# Patient Record
Sex: Male | Born: 1970 | Race: Black or African American | Hispanic: No | Marital: Single | Smoking: Current every day smoker
Health system: Southern US, Community
[De-identification: ages and names within clinical notes are randomized; demographics above are authoritative.]

## PROBLEM LIST (undated history)

## (undated) DIAGNOSIS — J40 Bronchitis, not specified as acute or chronic: Secondary | ICD-10-CM

## (undated) DIAGNOSIS — M1990 Unspecified osteoarthritis, unspecified site: Secondary | ICD-10-CM

## (undated) DIAGNOSIS — N19 Unspecified kidney failure: Secondary | ICD-10-CM

## (undated) DIAGNOSIS — M109 Gout, unspecified: Secondary | ICD-10-CM

## (undated) DIAGNOSIS — N289 Disorder of kidney and ureter, unspecified: Secondary | ICD-10-CM

## (undated) DIAGNOSIS — J45909 Unspecified asthma, uncomplicated: Secondary | ICD-10-CM

## (undated) DIAGNOSIS — Z21 Asymptomatic human immunodeficiency virus [HIV] infection status: Secondary | ICD-10-CM

## (undated) DIAGNOSIS — K4090 Unilateral inguinal hernia, without obstruction or gangrene, not specified as recurrent: Secondary | ICD-10-CM

## (undated) DIAGNOSIS — S2239XA Fracture of one rib, unspecified side, initial encounter for closed fracture: Secondary | ICD-10-CM

## (undated) DIAGNOSIS — Z5900 Homelessness unspecified: Secondary | ICD-10-CM

---

## 1997-11-12 ENCOUNTER — Emergency Department (HOSPITAL_COMMUNITY): Admission: EM | Admit: 1997-11-12 | Discharge: 1997-11-12 | Payer: Self-pay | Admitting: Emergency Medicine

## 1997-11-27 ENCOUNTER — Emergency Department (HOSPITAL_COMMUNITY): Admission: EM | Admit: 1997-11-27 | Discharge: 1997-11-27 | Payer: Self-pay | Admitting: Emergency Medicine

## 1998-02-11 ENCOUNTER — Emergency Department (HOSPITAL_COMMUNITY): Admission: EM | Admit: 1998-02-11 | Discharge: 1998-02-11 | Payer: Self-pay

## 1998-08-24 ENCOUNTER — Emergency Department (HOSPITAL_COMMUNITY): Admission: EM | Admit: 1998-08-24 | Discharge: 1998-08-25 | Payer: Self-pay | Admitting: Emergency Medicine

## 1999-02-08 ENCOUNTER — Encounter: Payer: Self-pay | Admitting: Emergency Medicine

## 1999-02-08 ENCOUNTER — Emergency Department (HOSPITAL_COMMUNITY): Admission: EM | Admit: 1999-02-08 | Discharge: 1999-02-08 | Payer: Self-pay | Admitting: Emergency Medicine

## 2000-07-09 ENCOUNTER — Emergency Department (HOSPITAL_COMMUNITY): Admission: EM | Admit: 2000-07-09 | Discharge: 2000-07-09 | Payer: Self-pay | Admitting: *Deleted

## 2002-11-13 ENCOUNTER — Emergency Department (HOSPITAL_COMMUNITY): Admission: EM | Admit: 2002-11-13 | Discharge: 2002-11-13 | Payer: Self-pay | Admitting: Emergency Medicine

## 2002-11-13 ENCOUNTER — Encounter: Payer: Self-pay | Admitting: Emergency Medicine

## 2004-01-06 ENCOUNTER — Emergency Department (HOSPITAL_COMMUNITY): Admission: EM | Admit: 2004-01-06 | Discharge: 2004-01-06 | Payer: Self-pay | Admitting: *Deleted

## 2004-02-19 ENCOUNTER — Emergency Department (HOSPITAL_COMMUNITY): Admission: EM | Admit: 2004-02-19 | Discharge: 2004-02-19 | Payer: Self-pay | Admitting: Emergency Medicine
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
## 2004-03-02 ENCOUNTER — Emergency Department (HOSPITAL_COMMUNITY): Admission: EM | Admit: 2004-03-02 | Discharge: 2004-03-02 | Payer: Self-pay | Admitting: Emergency Medicine

## 2004-03-10 ENCOUNTER — Emergency Department (HOSPITAL_COMMUNITY): Admission: EM | Admit: 2004-03-10 | Discharge: 2004-03-10 | Payer: Self-pay | Admitting: Emergency Medicine

## 2005-01-17 ENCOUNTER — Emergency Department (HOSPITAL_COMMUNITY): Admission: EM | Admit: 2005-01-17 | Discharge: 2005-01-17 | Payer: Self-pay | Admitting: Emergency Medicine

## 2005-01-20 ENCOUNTER — Emergency Department (HOSPITAL_COMMUNITY): Admission: EM | Admit: 2005-01-20 | Discharge: 2005-01-20 | Payer: Self-pay | Admitting: Emergency Medicine

## 2006-02-01 ENCOUNTER — Emergency Department (HOSPITAL_COMMUNITY): Admission: EM | Admit: 2006-02-01 | Discharge: 2006-02-01 | Payer: Self-pay | Admitting: Emergency Medicine

## 2006-02-01 IMAGING — CR DG HIP COMPLETE 2+V*R*
3 series · 3 of 3 positions shown · non-contrast
Comparison: none

CLINICAL DATA: Fall two weeks ago with pain.
 RIGHT HIP ? 3 VIEWS:

[t pelvis a.p.]
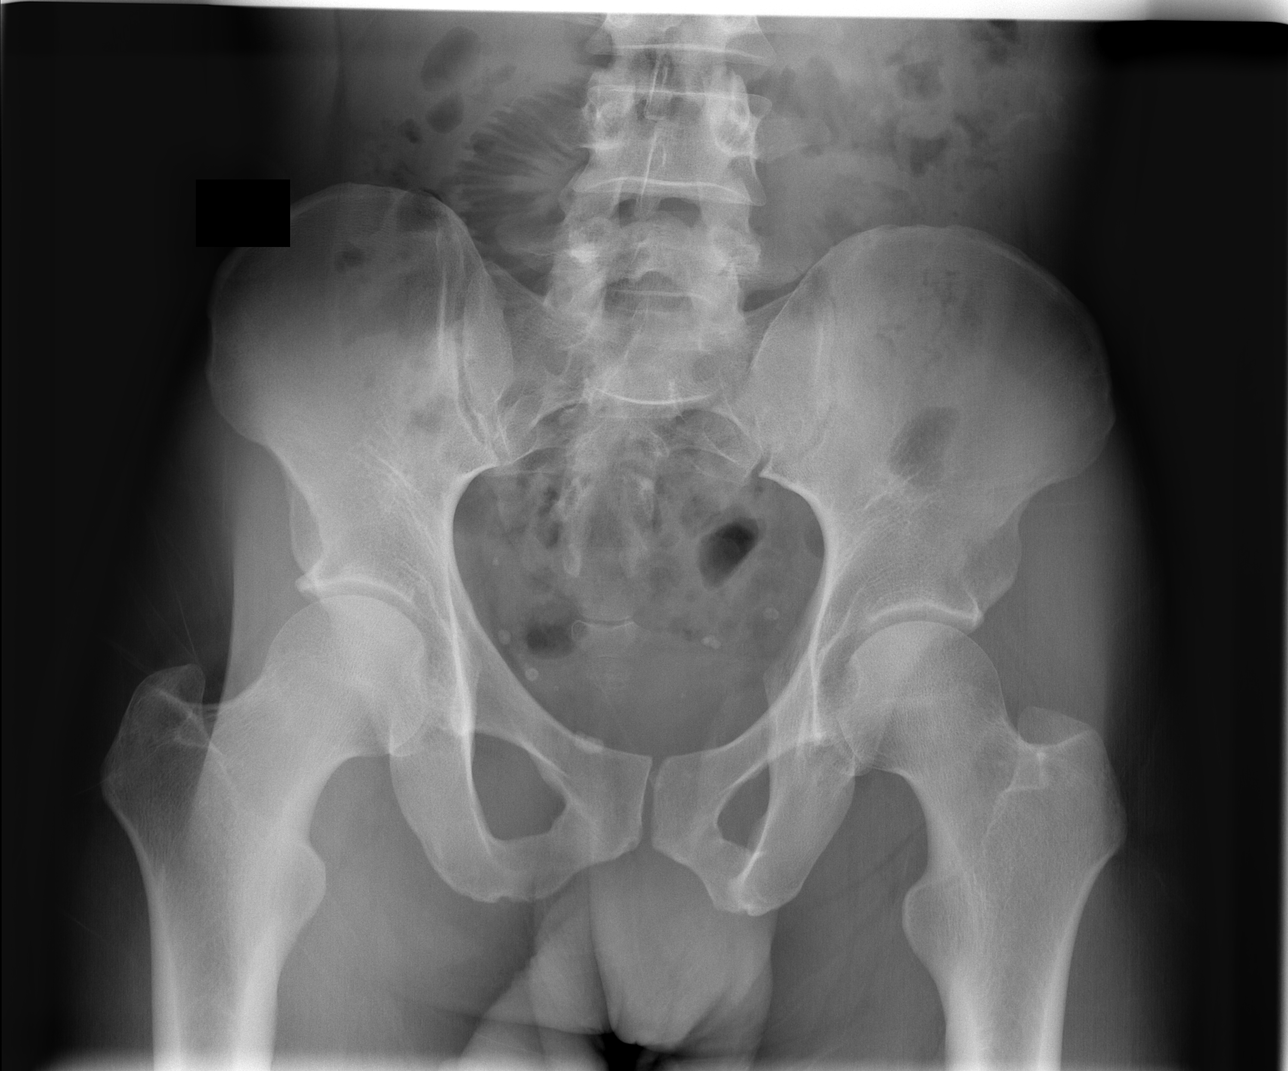

[t hip ap right]
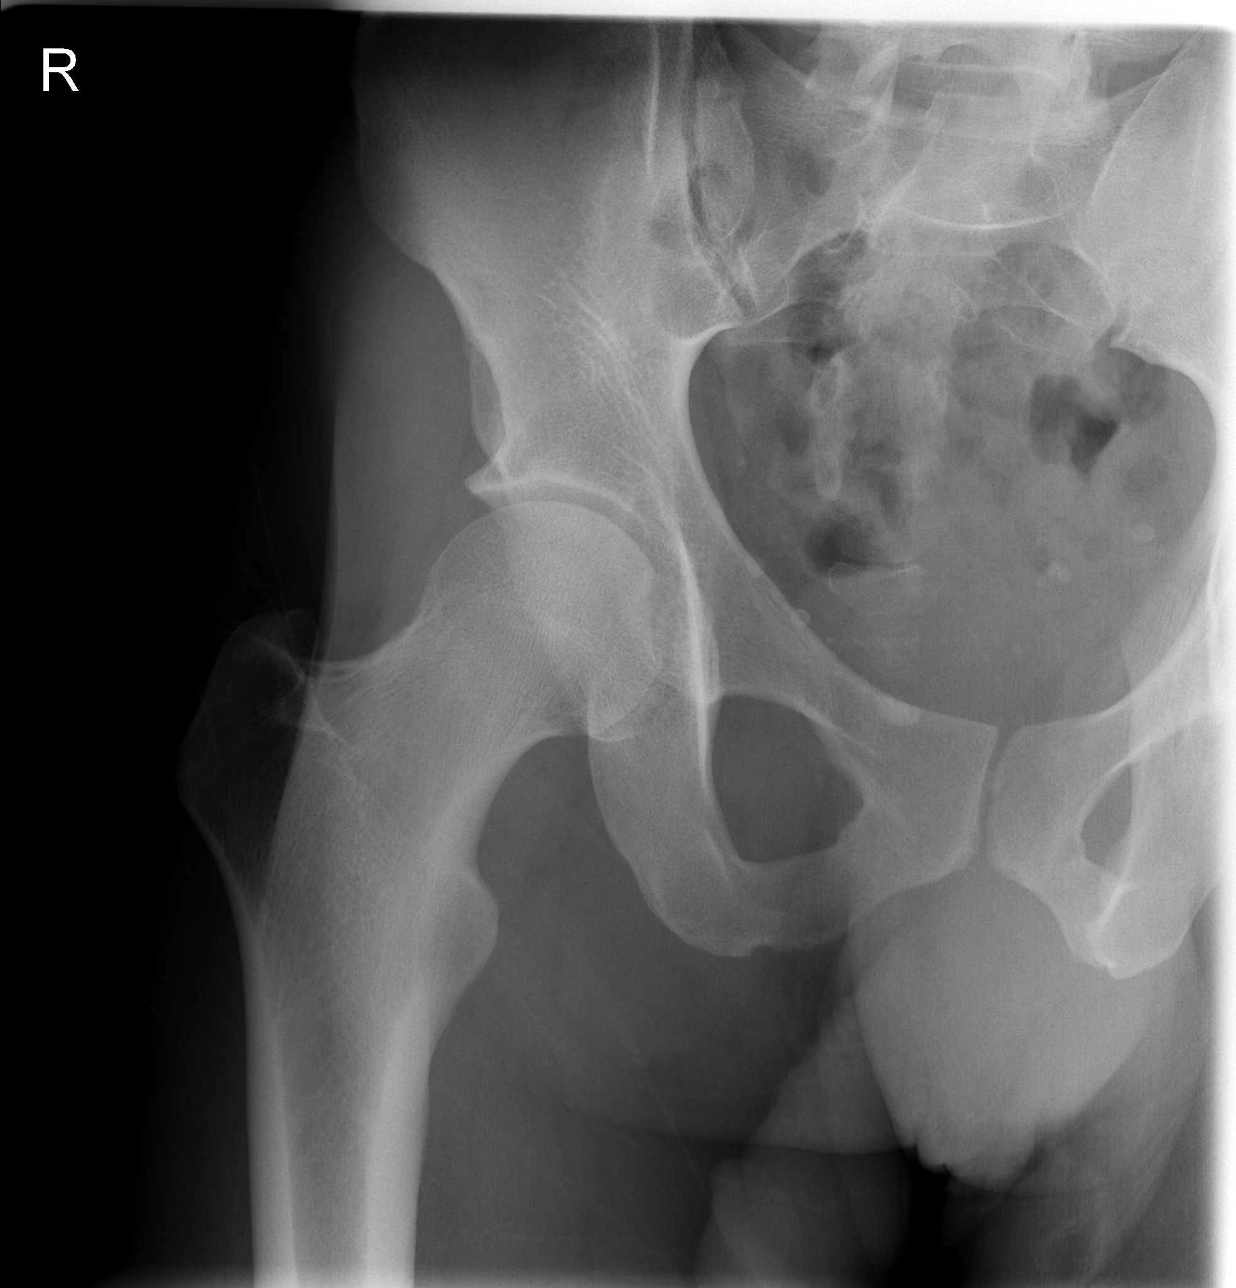

[t hip frog leg right]
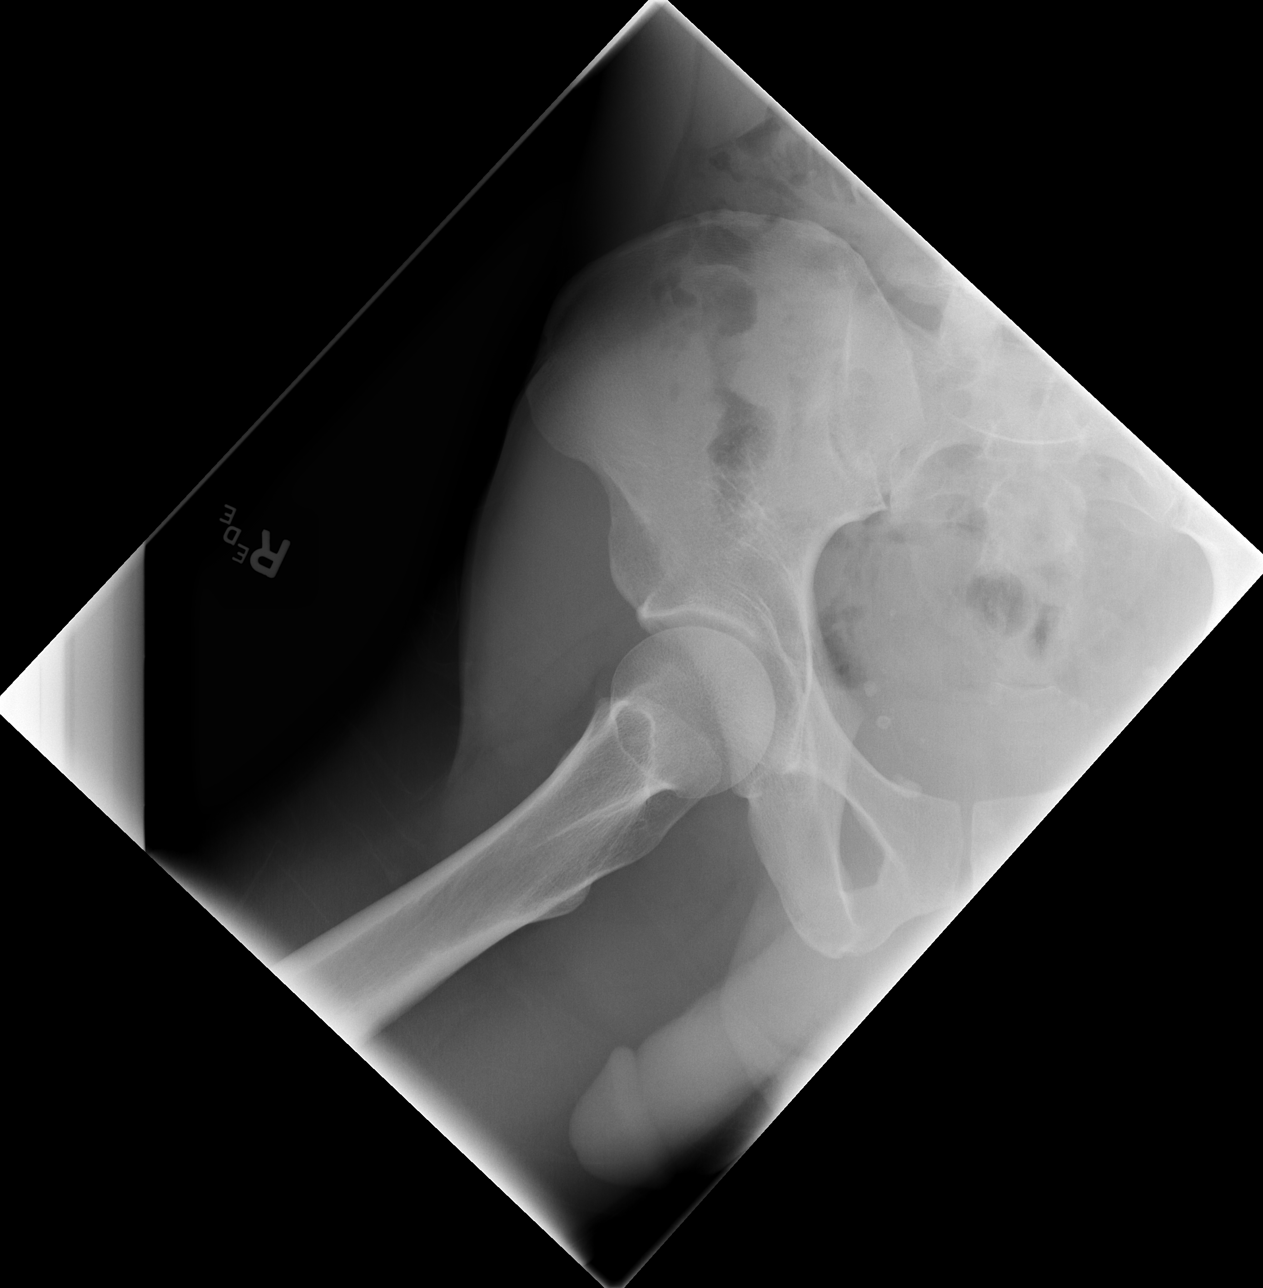

[3 of 3 positions shown; findings below may reference images not displayed]

FINDINGS: No fracture or dislocation.  Incidental imaging of the lower abdomen shows a minimally distended loop of small bowel with apparent wall edema.
IMPRESSION: 1.  No fracture or dislocation.  
 2.  Question small bowel wall edema, as above.  Please correlate clinically.

## 2006-03-04 ENCOUNTER — Emergency Department (HOSPITAL_COMMUNITY): Admission: EM | Admit: 2006-03-04 | Discharge: 2006-03-04 | Payer: Self-pay | Admitting: Emergency Medicine

## 2006-05-15 ENCOUNTER — Emergency Department (HOSPITAL_COMMUNITY): Admission: EM | Admit: 2006-05-15 | Discharge: 2006-05-15 | Payer: Self-pay | Admitting: Family Medicine
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
## 2006-09-13 ENCOUNTER — Ambulatory Visit: Payer: Self-pay | Admitting: Nurse Practitioner

## 2007-02-22 ENCOUNTER — Emergency Department (HOSPITAL_COMMUNITY): Admission: EM | Admit: 2007-02-22 | Discharge: 2007-02-22 | Payer: Self-pay | Admitting: Family Medicine

## 2008-10-12 ENCOUNTER — Emergency Department (HOSPITAL_COMMUNITY): Admission: EM | Admit: 2008-10-12 | Discharge: 2008-10-12 | Payer: Self-pay | Admitting: Emergency Medicine

## 2013-02-05 ENCOUNTER — Encounter (HOSPITAL_COMMUNITY): Payer: Self-pay | Admitting: Emergency Medicine

## 2013-02-05 ENCOUNTER — Emergency Department (HOSPITAL_COMMUNITY)
Admission: EM | Admit: 2013-02-05 | Discharge: 2013-02-05 | Disposition: A | Discharge status: Home / Self Care | Payer: Self-pay | Attending: Emergency Medicine | Admitting: Emergency Medicine

## 2013-02-05 ENCOUNTER — Emergency Department (HOSPITAL_COMMUNITY): Payer: Self-pay

## 2013-02-05 DIAGNOSIS — J9801 Acute bronchospasm: Secondary | ICD-10-CM | POA: Insufficient documentation

## 2013-02-05 DIAGNOSIS — M109 Gout, unspecified: Secondary | ICD-10-CM | POA: Insufficient documentation

## 2013-02-05 DIAGNOSIS — R0789 Other chest pain: Secondary | ICD-10-CM

## 2013-02-05 DIAGNOSIS — J45909 Unspecified asthma, uncomplicated: Secondary | ICD-10-CM | POA: Insufficient documentation

## 2013-02-05 DIAGNOSIS — Z79899 Other long term (current) drug therapy: Secondary | ICD-10-CM | POA: Insufficient documentation

## 2013-02-05 DIAGNOSIS — F17200 Nicotine dependence, unspecified, uncomplicated: Secondary | ICD-10-CM | POA: Insufficient documentation

## 2013-02-05 DIAGNOSIS — R071 Chest pain on breathing: Secondary | ICD-10-CM | POA: Insufficient documentation

## 2013-02-05 DIAGNOSIS — R05 Cough: Secondary | ICD-10-CM

## 2013-02-05 HISTORY — DX: Unspecified asthma, uncomplicated: J45.909

## 2013-02-05 HISTORY — DX: Gout, unspecified: M10.9

## 2013-02-05 IMAGING — CR DG CHEST 2V
2 series · 2 of 2 positions shown · non-contrast
Comparison: Chest radiograph [DATE]

CLINICAL DATA: Mid chest pain.

CHEST - 2 VIEW

[w chest pa]
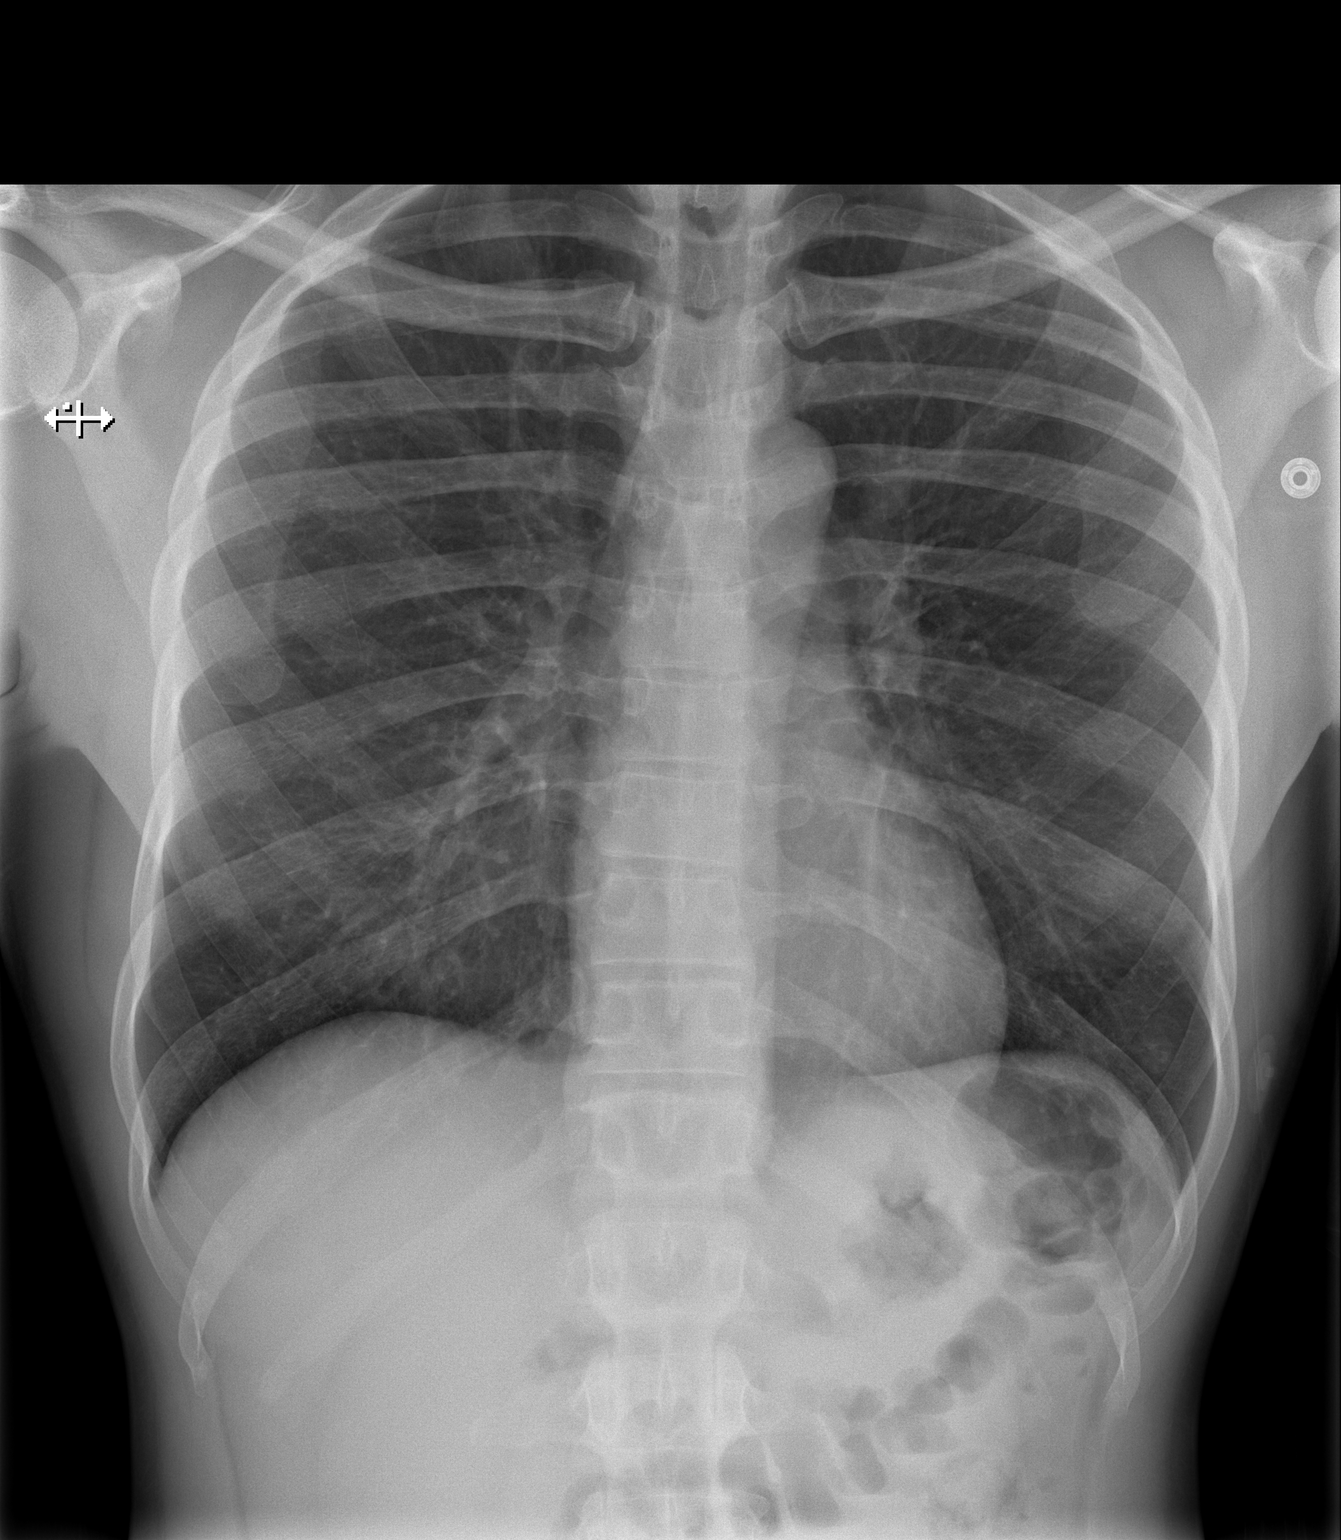

[w chest lat]
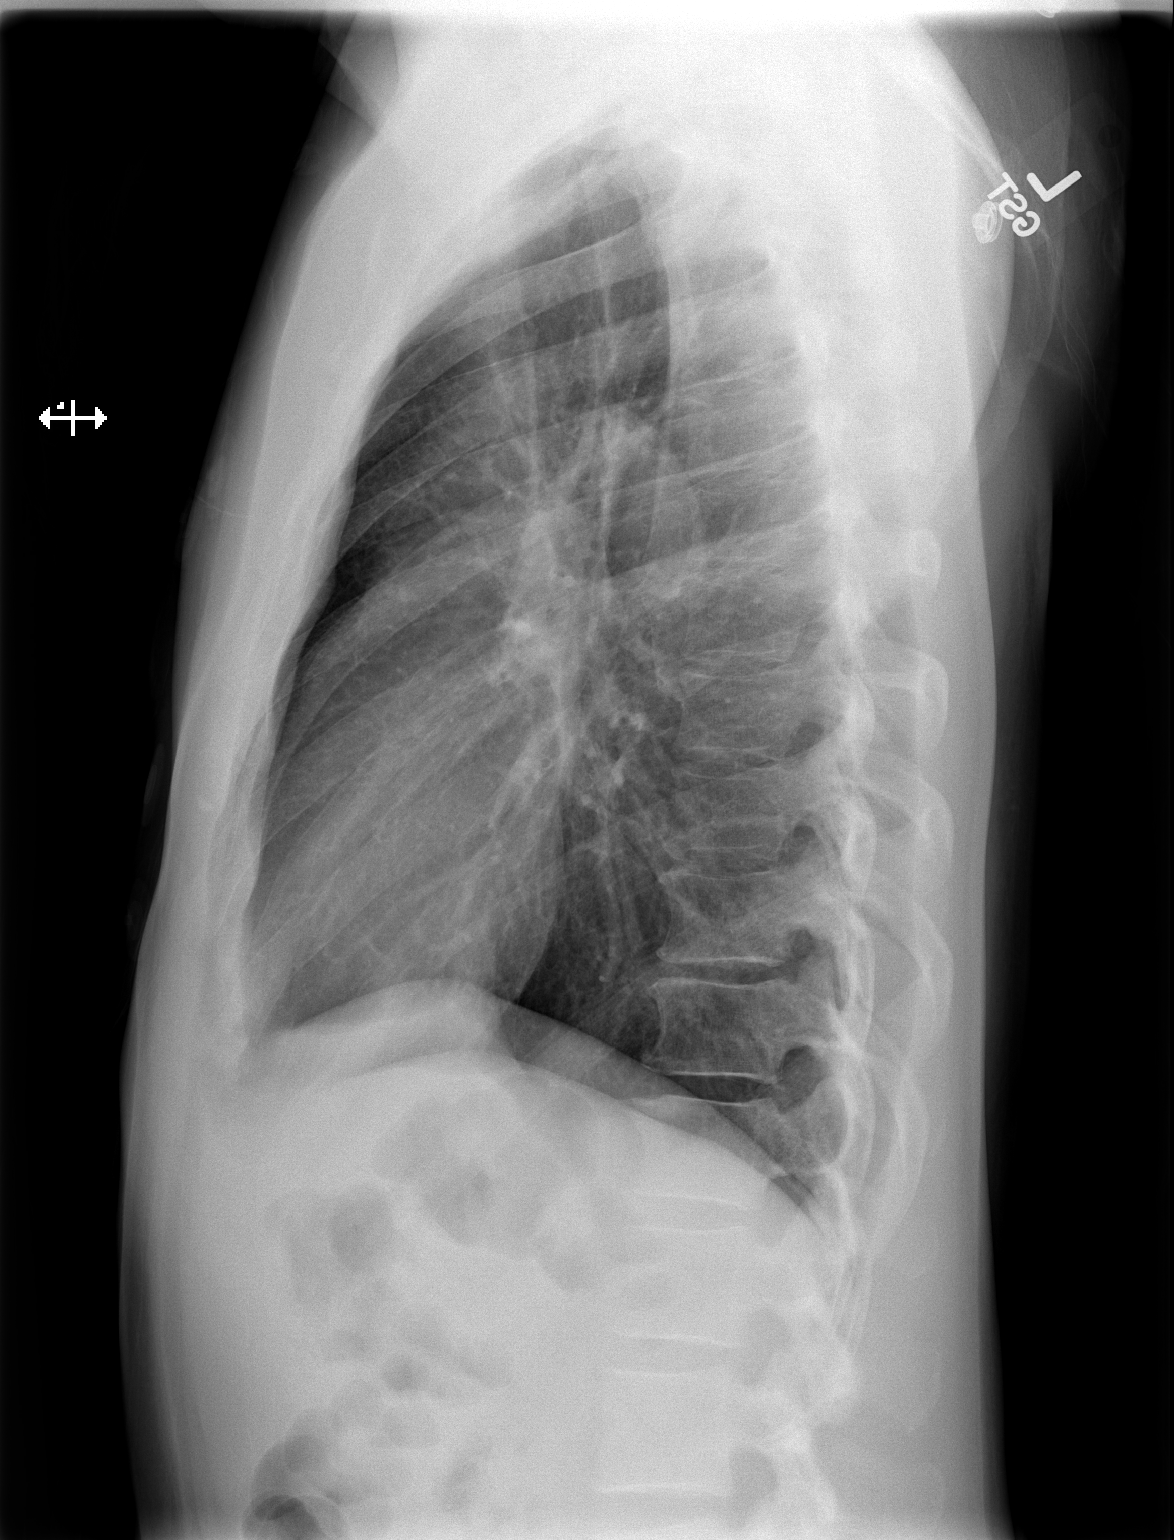

[2 of 2 positions shown; findings below may reference images not displayed]

FINDINGS: The cardiomediastinal silhouette is unremarkable.  The
lungs are clear without pleural effusions or focal consolidations.
The pulmonary vasculature is unremarkable.   Trachea projects
midline and there is no pneumothorax.

The included soft tissue planes and osseous structures are
unremarkable.  Pectus excavatum again noted.
IMPRESSION: No acute cardiopulmonary process; stable appearance of chest from
[DATE].

## 2013-02-05 MED ORDER — ALBUTEROL SULFATE HFA 108 (90 BASE) MCG/ACT IN AERS: 1.0000 | INHALATION_SPRAY | RESPIRATORY_TRACT | Status: DC | PRN

## 2013-02-05 MED ORDER — HYDROCOD POLST-CHLORPHEN POLST 10-8 MG/5ML PO LQCR
5.0000 mL | Freq: Once | ORAL | Status: AC
  Administered 2013-02-05: 5 mL via ORAL
  Filled 2013-02-05: qty 5

## 2013-02-05 MED ORDER — ALBUTEROL SULFATE HFA 108 (90 BASE) MCG/ACT IN AERS
1.0000 | INHALATION_SPRAY | RESPIRATORY_TRACT | Status: DC | PRN
  Administered 2013-02-05: 2 via RESPIRATORY_TRACT
  Filled 2013-02-05: qty 6.7

## 2013-02-05 MED ORDER — HYDROCOD POLST-CHLORPHEN POLST 10-8 MG/5ML PO LQCR: 5.0000 mL | Freq: Once | ORAL | Status: DC
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
## 2013-02-05 NOTE — ED Notes (Signed)
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  Pain increases with palpation to left breast area medially.

## 2013-07-28 ENCOUNTER — Emergency Department (HOSPITAL_COMMUNITY): Payer: Self-pay

## 2013-07-28 ENCOUNTER — Encounter (HOSPITAL_COMMUNITY): Payer: Self-pay | Admitting: Emergency Medicine

## 2013-07-28 ENCOUNTER — Emergency Department (HOSPITAL_COMMUNITY)
Admission: EM | Admit: 2013-07-28 | Discharge: 2013-07-29 | Disposition: A | Discharge status: Home / Self Care | Payer: Self-pay | Attending: Emergency Medicine | Admitting: Emergency Medicine

## 2013-07-28 DIAGNOSIS — Z79899 Other long term (current) drug therapy: Secondary | ICD-10-CM | POA: Insufficient documentation

## 2013-07-28 DIAGNOSIS — F10929 Alcohol use, unspecified with intoxication, unspecified: Secondary | ICD-10-CM

## 2013-07-28 DIAGNOSIS — F1010 Alcohol abuse, uncomplicated: Secondary | ICD-10-CM | POA: Insufficient documentation

## 2013-07-28 DIAGNOSIS — J45909 Unspecified asthma, uncomplicated: Secondary | ICD-10-CM | POA: Insufficient documentation

## 2013-07-28 DIAGNOSIS — R51 Headache: Secondary | ICD-10-CM | POA: Insufficient documentation

## 2013-07-28 DIAGNOSIS — G8929 Other chronic pain: Secondary | ICD-10-CM | POA: Insufficient documentation

## 2013-07-28 DIAGNOSIS — M545 Low back pain, unspecified: Secondary | ICD-10-CM | POA: Insufficient documentation

## 2013-07-28 DIAGNOSIS — Z8639 Personal history of other endocrine, nutritional and metabolic disease: Secondary | ICD-10-CM | POA: Insufficient documentation

## 2013-07-28 DIAGNOSIS — M546 Pain in thoracic spine: Secondary | ICD-10-CM | POA: Insufficient documentation

## 2013-07-28 DIAGNOSIS — F17200 Nicotine dependence, unspecified, uncomplicated: Secondary | ICD-10-CM | POA: Insufficient documentation

## 2013-07-28 DIAGNOSIS — M542 Cervicalgia: Secondary | ICD-10-CM | POA: Insufficient documentation

## 2013-07-28 DIAGNOSIS — R42 Dizziness and giddiness: Secondary | ICD-10-CM | POA: Insufficient documentation

## 2013-07-28 DIAGNOSIS — R55 Syncope and collapse: Secondary | ICD-10-CM | POA: Insufficient documentation

## 2013-07-28 DIAGNOSIS — Z8739 Personal history of other diseases of the musculoskeletal system and connective tissue: Secondary | ICD-10-CM | POA: Insufficient documentation

## 2013-07-28 DIAGNOSIS — Z862 Personal history of diseases of the blood and blood-forming organs and certain disorders involving the immune mechanism: Secondary | ICD-10-CM | POA: Insufficient documentation

## 2013-07-28 HISTORY — DX: Unspecified osteoarthritis, unspecified site: M19.90

## 2013-07-28 LAB — CBC WITH DIFFERENTIAL/PLATELET
Basophils Absolute: 0 10*3/uL (ref 0.0–0.1)
Basophils Relative: 0 % (ref 0–1)
Eosinophils Absolute: 0.2 10*3/uL (ref 0.0–0.7)
Eosinophils Relative: 2 % (ref 0–5)
HCT: 34.9 % — ABNORMAL LOW (ref 39.0–52.0)
Hemoglobin: 12.5 g/dL — ABNORMAL LOW (ref 13.0–17.0)
Lymphocytes Relative: 43 % (ref 12–46)
Lymphs Abs: 3.6 10*3/uL (ref 0.7–4.0)
MCH: 34.2 pg — ABNORMAL HIGH (ref 26.0–34.0)
MCHC: 35.8 g/dL (ref 30.0–36.0)
MCV: 95.4 fL (ref 78.0–100.0)
Monocytes Absolute: 0.5 10*3/uL (ref 0.1–1.0)
Monocytes Relative: 6 % (ref 3–12)
Neutro Abs: 4.1 10*3/uL (ref 1.7–7.7)
Neutrophils Relative %: 49 % (ref 43–77)
Platelets: 299 10*3/uL (ref 150–400)
RBC: 3.66 MIL/uL — ABNORMAL LOW (ref 4.22–5.81)
RDW: 13.2 % (ref 11.5–15.5)
WBC: 8.4 10*3/uL (ref 4.0–10.5)

## 2013-07-28 LAB — I-STAT CHEM 8, ED
BUN: 9 mg/dL (ref 6–23)
Calcium, Ion: 1.14 mmol/L (ref 1.12–1.23)
Chloride: 107 mEq/L (ref 96–112)
Creatinine, Ser: 1.2 mg/dL (ref 0.50–1.35)
Glucose, Bld: 90 mg/dL (ref 70–99)
HCT: 39 % (ref 39.0–52.0)
Hemoglobin: 13.3 g/dL (ref 13.0–17.0)
Potassium: 3.6 mEq/L — ABNORMAL LOW (ref 3.7–5.3)
Sodium: 141 mEq/L (ref 137–147)
TCO2: 25 mmol/L (ref 0–100)

## 2013-07-28 LAB — CBG MONITORING, ED: Glucose-Capillary: 98 mg/dL (ref 70–99)
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
## 2013-07-28 NOTE — ED Notes (Signed)
confirmed needed labs with main lab

## 2013-07-28 NOTE — ED Notes (Signed)
Pt logrolled by RN x3, full spinal precautions maintained, TTP to complete spine, removed from LSB, c-collar remains, remains supine, HOB 15 degrees, alert, NAD, calm, cooperative, intoxicated, smells of ETOH, follows commands, MAEx4, A&Ox4, Dr. Ranae PalmsYelverton [facility] room[facility]
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
## 2013-07-29 LAB — COMPREHENSIVE METABOLIC PANEL
ALT: 17 U/L (ref 0–53)
AST: 21 U/L (ref 0–37)
Albumin: 3.7 g/dL (ref 3.5–5.2)
Alkaline Phosphatase: 60 U/L (ref 39–117)
BUN: 10 mg/dL (ref 6–23)
CO2: 23 mEq/L (ref 19–32)
Calcium: 8.8 mg/dL (ref 8.4–10.5)
Chloride: 102 mEq/L (ref 96–112)
Creatinine, Ser: 0.94 mg/dL (ref 0.50–1.35)
GFR calc Af Amer: >90 mL/min (ref >90)
GFR calc non Af Amer: >90 mL/min (ref >90)
Glucose, Bld: 88 mg/dL (ref 70–99)
Potassium: 3.8 mEq/L (ref 3.7–5.3)
Sodium: 142 mEq/L (ref 137–147)
Total Bilirubin: 0.2 mg/dL — ABNORMAL LOW (ref 0.3–1.2)
Total Protein: 7 g/dL (ref 6.0–8.3)

## 2013-07-29 LAB — ETHANOL: Alcohol, Ethyl (B): 162 mg/dL — ABNORMAL HIGH (ref 0–11)

## 2013-07-29 NOTE — ED Notes (Signed)
Imaging results reviewed 

## 2013-07-29 NOTE — Discharge Instructions (Signed)
Alcohol Intoxication Alcohol intoxication occurs when the amount of alcohol that a person has consumed impairs his or her ability to mentally and physically function. Alcohol directly impairs the normal chemical activity of the brain. Drinking large amounts of alcohol can lead to changes in mental function and behavior, and it can cause many physical effects that can be harmful.  Alcohol intoxication can range in severity from mild to very severe. Various factors can affect the level of intoxication that occurs, such as the person's age, gender, weight, frequency of alcohol consumption, and the presence of other medical conditions (such as diabetes, seizures, or heart conditions). Dangerous levels of alcohol intoxication may occur when people drink large amounts of alcohol in a short period (binge drinking). Alcohol can also be especially dangerous when combined with certain prescription medicines or "recreational" drugs. SIGNS AND SYMPTOMS Some common signs and symptoms of mild alcohol intoxication include:  Loss of coordination.  Changes in mood and behavior.  Impaired judgment.  Slurred speech. As alcohol intoxication progresses to more severe levels, other signs and symptoms will appear. These may include:  Vomiting.  Confusion and impaired memory.  Slowed breathing.  Seizures.  Loss of consciousness. DIAGNOSIS  Your health care provider will take a medical history and perform a physical exam. You will be asked about the amount and type of alcohol you have consumed. Blood tests will be done to measure the concentration of alcohol in your blood. In many places, your blood alcohol level must be lower than 80 mg/dL (0.86%0.08%) to legally drive. However, many dangerous effects of alcohol can occur at much lower levels.  TREATMENT  People with alcohol intoxication often do not require treatment. Most of the effects of alcohol intoxication are temporary, and they go away as the alcohol naturally  leaves the body. Your health care provider will monitor your condition until you are stable enough to go home. Fluids are sometimes given through an IV access tube to help prevent dehydration.  HOME CARE INSTRUCTIONS  Do not drive after drinking alcohol.  Stay hydrated. Drink enough water and fluids to keep your urine clear or pale yellow. Avoid caffeine.   Only take over-the-counter or prescription medicines as directed by your health care provider.  SEEK MEDICAL CARE IF:   You have persistent vomiting.   You do not feel better after a few days.  You have frequent alcohol intoxication. Your health care provider can help determine if you should see a substance use treatment counselor. SEEK IMMEDIATE MEDICAL CARE IF:   You become shaky or tremble when you try to stop drinking.   You shake uncontrollably (seizure).   You throw up (vomit) blood. This may be bright red or may look like black coffee grounds.   You have blood in your stool. This may be bright red or may appear as a black, tarry, bad smelling stool.   You become lightheaded or faint.  MAKE SURE YOU:   Understand these instructions.  Will watch your condition.  Will get help right away if you are not doing well or get worse. Document Released: 01/19/2005 Document Revised: 12/12/2012 Document Reviewed: 09/14/2012 [facility] Patient Information 2014 [place], [place].  Syncope Syncope is a fainting spell. This means the person loses consciousness and drops to the ground. The person is generally unconscious for less than 5 minutes. The person may have some muscle twitches for up to 15 seconds before waking up and returning to normal. Syncope occurs more often in elderly people, but it  can happen to anyone. While most causes of syncope are not dangerous, syncope can be a sign of a serious medical problem. It is important to seek medical care.  CAUSES  Syncope is caused by a sudden decrease in blood flow to the brain.  The specific cause is often not determined. Factors that can trigger syncope include:  Taking medicines that lower blood pressure.  Sudden changes in posture, such as standing up suddenly.  Taking more medicine than prescribed.  Standing in one place for too long.  Seizure disorders.  Dehydration and excessive exposure to heat.  Low blood sugar (hypoglycemia).  Straining to have a bowel movement.  Heart disease, irregular heartbeat, or other circulatory problems.  Fear, emotional distress, seeing blood, or severe pain. SYMPTOMS  Right before fainting, you may:  Feel dizzy or lightheaded.  Feel nauseous.  See all white or all black in your field of vision.  Have cold, clammy skin. DIAGNOSIS  Your caregiver will ask about your symptoms, perform a physical exam, and perform electrocardiography (ECG) to record the electrical activity of your heart. Your caregiver may also perform other heart or blood tests to determine the cause of your syncope. TREATMENT  In most cases, no treatment is needed. Depending on the cause of your syncope, your caregiver may recommend changing or stopping some of your medicines. HOME CARE INSTRUCTIONS  Have someone stay with you until you feel stable.  Do not drive, operate machinery, or play sports until your caregiver says it is okay.  Keep all follow-up appointments as directed by your caregiver.  Lie down right away if you start feeling like you might faint. Breathe deeply and steadily. Wait until all the symptoms have passed.  Drink enough fluids to keep your urine clear or pale yellow.  If you are taking blood pressure or heart medicine, get up slowly, taking several minutes to sit and then stand. This can reduce dizziness. SEEK IMMEDIATE MEDICAL CARE IF:   You have a severe headache.  You have unusual pain in the chest, abdomen, or back.  You are bleeding from the mouth or rectum, or you have black or tarry stool.  You have an  irregular or very fast heartbeat.  You have pain with breathing.  You have repeated fainting or seizure-like jerking during an episode.  You faint when sitting or lying down.  You have confusion.  You have difficulty walking.  You have severe weakness.  You have vision problems. If you fainted, call your local emergency services (911 in [place] Do not drive yourself to the hospital.  MAKE SURE YOU:  Understand these instructions.  Will watch your condition.  Will get help right away if you are not doing well or get worse. Document Released: 04/11/2005 Document Revised: 10/11/2011 Document Reviewed: 06/10/2011 [facility] Patient Information 2014 [place], [place].

## 2013-07-29 NOTE — ED Notes (Signed)
c-collar removed by Dr. Ranae PalmsYelverton
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
## 2013-07-29 NOTE — ED Notes (Signed)
Moved to C24, remains on monitor, VSS, [facility].

## 2013-10-17 IMAGING — CR DG KNEE COMPLETE 4+V*R*
4 series · 4 of 4 positions shown · non-contrast
Comparison: None.

CLINICAL DATA: Knee pain.

EXAM:
RIGHT KNEE - COMPLETE 4+ VIEW

[t knee ap right]
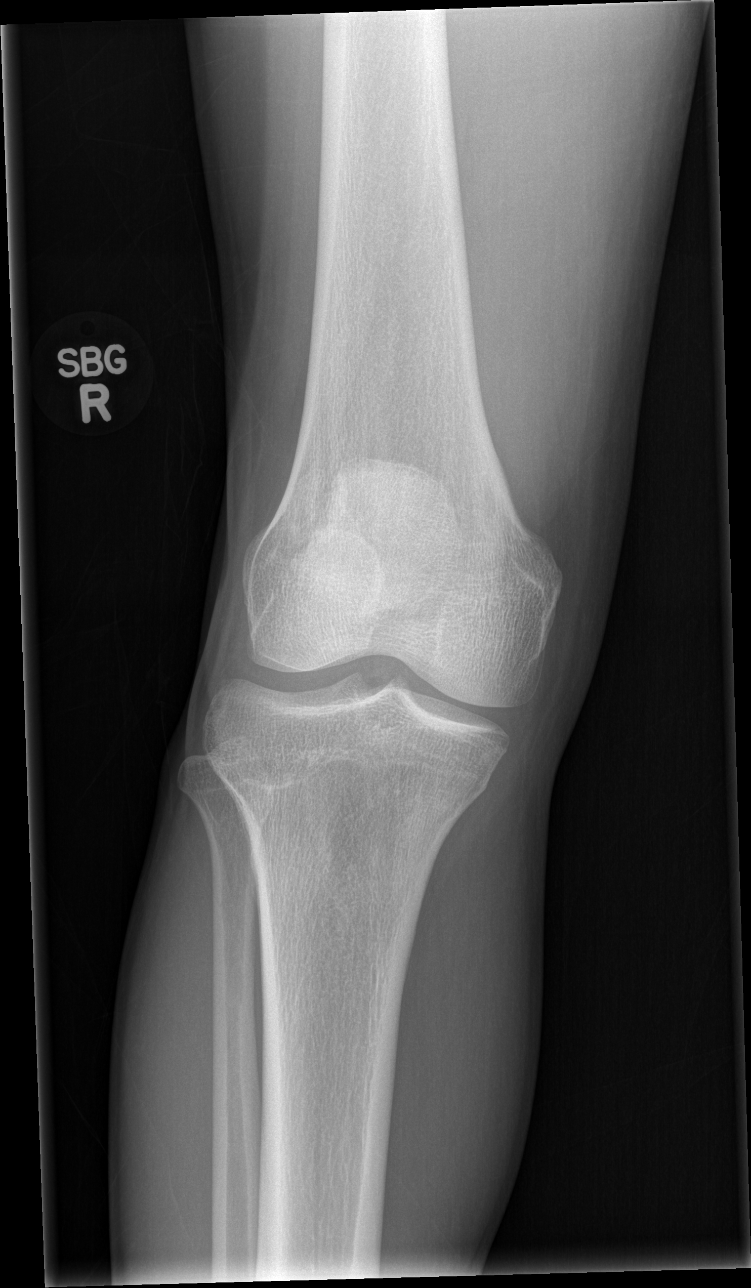

[t knee oblique right (1 of 2)]
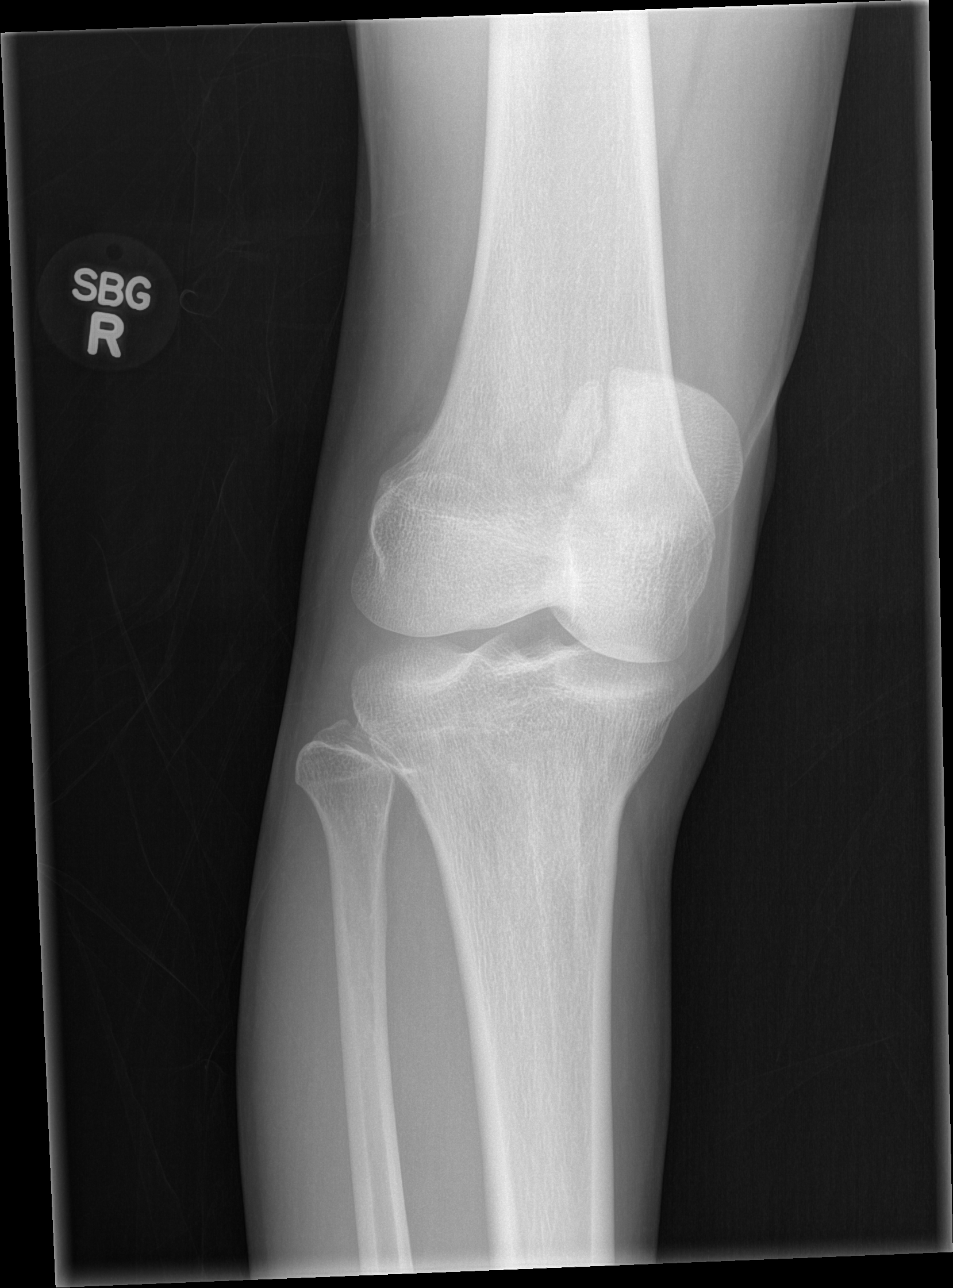

[t knee oblique right (2 of 2)]
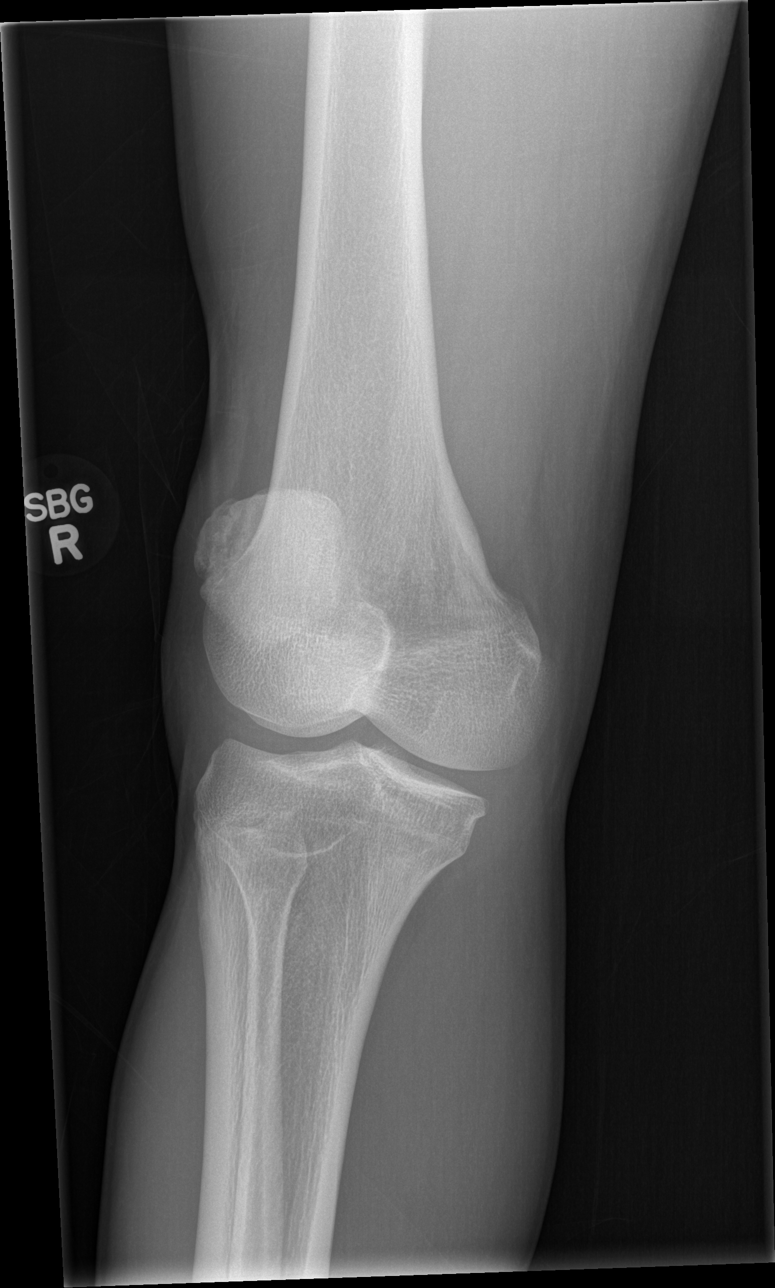

[t knee lat right]
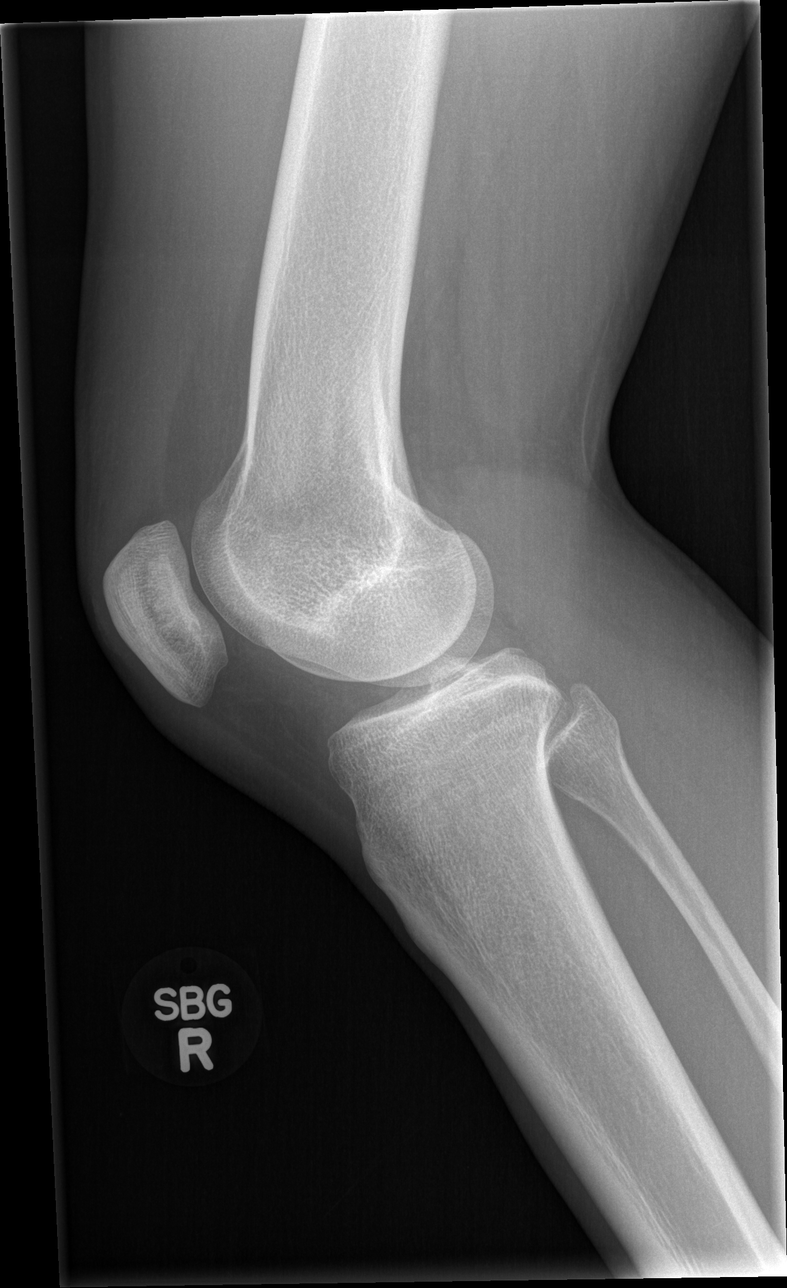

[4 of 4 positions shown; findings below may reference images not displayed]

FINDINGS: Soft tissue structures are unremarkable. Bipartite patella noted. No
evidence of fracture dislocation.
IMPRESSION: No acute abnormality.  Bipartite patella.

## 2013-12-21 ENCOUNTER — Emergency Department (HOSPITAL_COMMUNITY): Payer: Self-pay

## 2013-12-21 ENCOUNTER — Encounter (HOSPITAL_COMMUNITY): Payer: Self-pay | Admitting: Emergency Medicine

## 2013-12-21 ENCOUNTER — Emergency Department (HOSPITAL_COMMUNITY)
Admission: EM | Admit: 2013-12-21 | Discharge: 2013-12-21 | Disposition: A | Discharge status: Home / Self Care | Payer: Self-pay | Attending: Emergency Medicine | Admitting: Emergency Medicine

## 2013-12-21 DIAGNOSIS — Y92410 Unspecified street and highway as the place of occurrence of the external cause: Secondary | ICD-10-CM | POA: Insufficient documentation

## 2013-12-21 DIAGNOSIS — S4980XA Other specified injuries of shoulder and upper arm, unspecified arm, initial encounter: Secondary | ICD-10-CM | POA: Insufficient documentation

## 2013-12-21 DIAGNOSIS — Z862 Personal history of diseases of the blood and blood-forming organs and certain disorders involving the immune mechanism: Secondary | ICD-10-CM | POA: Insufficient documentation

## 2013-12-21 DIAGNOSIS — IMO0002 Reserved for concepts with insufficient information to code with codable children: Secondary | ICD-10-CM | POA: Insufficient documentation

## 2013-12-21 DIAGNOSIS — S79919A Unspecified injury of unspecified hip, initial encounter: Secondary | ICD-10-CM | POA: Insufficient documentation

## 2013-12-21 DIAGNOSIS — S99929A Unspecified injury of unspecified foot, initial encounter: Secondary | ICD-10-CM

## 2013-12-21 DIAGNOSIS — S0993XA Unspecified injury of face, initial encounter: Secondary | ICD-10-CM | POA: Insufficient documentation

## 2013-12-21 DIAGNOSIS — Y9389 Activity, other specified: Secondary | ICD-10-CM | POA: Insufficient documentation

## 2013-12-21 DIAGNOSIS — S0990XA Unspecified injury of head, initial encounter: Secondary | ICD-10-CM | POA: Insufficient documentation

## 2013-12-21 DIAGNOSIS — F17200 Nicotine dependence, unspecified, uncomplicated: Secondary | ICD-10-CM | POA: Insufficient documentation

## 2013-12-21 DIAGNOSIS — S99919A Unspecified injury of unspecified ankle, initial encounter: Secondary | ICD-10-CM

## 2013-12-21 DIAGNOSIS — Z9889 Other specified postprocedural states: Secondary | ICD-10-CM | POA: Insufficient documentation

## 2013-12-21 DIAGNOSIS — S060X9A Concussion with loss of consciousness of unspecified duration, initial encounter: Secondary | ICD-10-CM | POA: Insufficient documentation

## 2013-12-21 DIAGNOSIS — J45909 Unspecified asthma, uncomplicated: Secondary | ICD-10-CM | POA: Insufficient documentation

## 2013-12-21 DIAGNOSIS — S199XXA Unspecified injury of neck, initial encounter: Secondary | ICD-10-CM

## 2013-12-21 DIAGNOSIS — S46909A Unspecified injury of unspecified muscle, fascia and tendon at shoulder and upper arm level, unspecified arm, initial encounter: Secondary | ICD-10-CM | POA: Insufficient documentation

## 2013-12-21 DIAGNOSIS — Z8639 Personal history of other endocrine, nutritional and metabolic disease: Secondary | ICD-10-CM | POA: Insufficient documentation

## 2013-12-21 DIAGNOSIS — Z79899 Other long term (current) drug therapy: Secondary | ICD-10-CM | POA: Insufficient documentation

## 2013-12-21 DIAGNOSIS — S8990XA Unspecified injury of unspecified lower leg, initial encounter: Secondary | ICD-10-CM | POA: Insufficient documentation

## 2013-12-21 DIAGNOSIS — M129 Arthropathy, unspecified: Secondary | ICD-10-CM | POA: Insufficient documentation

## 2013-12-21 DIAGNOSIS — S79929A Unspecified injury of unspecified thigh, initial encounter: Secondary | ICD-10-CM
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
## 2013-12-21 MED ORDER — MORPHINE SULFATE 4 MG/ML IJ SOLN
4.0000 mg | Freq: Once | INTRAMUSCULAR | Status: AC
  Administered 2013-12-21: 4 mg via INTRAMUSCULAR
  Filled 2013-12-21: qty 1

## 2013-12-21 MED ORDER — OXYCODONE-ACETAMINOPHEN 5-325 MG PO TABS: 1.0000 | ORAL_TABLET | ORAL | Status: DC | PRN

## 2013-12-21 MED ORDER — OXYCODONE-ACETAMINOPHEN 5-325 MG PO TABS
2.0000 | ORAL_TABLET | Freq: Once | ORAL | Status: AC
  Administered 2013-12-21: 2 via ORAL
  Filled 2013-12-21: qty 2

## 2013-12-21 NOTE — Discharge Instructions (Signed)
Please follow up with your primary care physician in 1-2 days. If you do not have one please call the [facility] number listed above. Please take pain medication and/or muscle relaxants as prescribed and as needed for pain. Please do not drive on narcotic pain medication or on muscle relaxants. Please read all discharge instructions and return precautions.    Concussion A concussion, or closed-head injury, is a brain injury caused by a direct blow to the head or by a quick and sudden movement (jolt) of the head or neck. Concussions are usually not life-threatening. Even so, the effects of a concussion can be serious. If you have had a concussion before, you are more likely to experience concussion-like symptoms after a direct blow to the head.  CAUSES  Direct blow to the head, such as from running into another player during a soccer game, being hit in a fight, or hitting your head on a hard surface.  A jolt of the head or neck that causes the brain to move back and forth inside the skull, such as in a car crash. SIGNS AND SYMPTOMS The signs of a concussion can be hard to notice. Early on, they may be missed by you, family members, and health care providers. You may look fine but act or feel differently. Symptoms are usually temporary, but they may last for days, weeks, or even longer. Some symptoms may appear right away while others may not show up for hours or days. Every head injury is different. Symptoms include:  Mild to moderate headaches that will not go away.  A feeling of pressure inside your head.  Having more trouble than usual:  Learning or remembering things you have heard.  Answering questions.  Paying attention or concentrating.  Organizing daily tasks.  Making decisions and solving problems.  Slowness in thinking, acting or reacting, speaking, or reading.  Getting lost or being easily confused.  Feeling tired all the time or lacking energy  (fatigued).  Feeling drowsy.  Sleep disturbances.  Sleeping more than usual.  Sleeping less than usual.  Trouble falling asleep.  Trouble sleeping (insomnia).  Loss of balance or feeling lightheaded or dizzy.  Nausea or vomiting.  Numbness or tingling.  Increased sensitivity to:  Sounds.  Lights.  Distractions.  Vision problems or eyes that tire easily.  Diminished sense of taste or smell.  Ringing in the ears.  Mood changes such as feeling sad or anxious.  Becoming easily irritated or angry for little or no reason.  Lack of motivation.  Seeing or hearing things other people do not see or hear (hallucinations). DIAGNOSIS Your health care provider can usually diagnose a concussion based on a description of your injury and symptoms. He or she will ask whether you passed out (lost consciousness) and whether you are having trouble remembering events that happened right before and during your injury. Your evaluation might include:  A brain scan to look for signs of injury to the brain. Even if the test shows no injury, you may still have a concussion.  Blood tests to be sure other problems are not present. TREATMENT  Concussions are usually treated in an emergency department, in urgent care, or at a clinic. You may need to stay in the hospital overnight for further treatment.  Tell your health care provider if you are taking any medicines, including prescription medicines, over-the-counter medicines, and natural remedies. Some medicines, such as blood thinners (anticoagulants) and aspirin, may increase the chance of  complications. Also tell your health care provider whether you have had alcohol or are taking illegal drugs. This information may affect treatment. °· Your health care provider will send you home with important instructions to follow. °· How fast you will recover from a concussion depends on many factors. These factors include how severe your concussion is,  what part of your brain was injured, your age, and how healthy you were before the concussion. °· Most people with mild injuries recover fully. Recovery can take time. In general, recovery is slower in older persons. Also, persons who have had a concussion in the past or have other medical problems may find that it takes longer to recover from their current injury. °HOME CARE INSTRUCTIONS °General Instructions °· Carefully follow the directions your health care provider gave you. °· Only take over-the-counter or prescription medicines for pain, discomfort, or fever as directed by your health care provider. °· Take only those medicines that your health care provider has approved. °· Do not drink alcohol until your health care provider says you are well enough to do so. Alcohol and certain other drugs may slow your recovery and can put you at risk of further injury. °· If it is harder than usual to remember things, write them down. °· If you are easily distracted, try to do one thing at a time. For example, do not try to watch TV while fixing dinner. °· Talk with family members or close friends when making important decisions. °· Keep all follow-up appointments. Repeated evaluation of your symptoms is recommended for your recovery. °· Watch your symptoms and tell others to do the same. Complications sometimes occur after a concussion. Older adults with a brain injury may have a higher risk of serious complications, such as a blood clot on the brain. °· Tell your teachers, school nurse, school counselor, coach, athletic trainer, or work manager about your injury, symptoms, and restrictions. Tell them about what you can or cannot do. They should watch for: °¨ Increased problems with attention or concentration. °¨ Increased difficulty remembering or learning new information. °¨ Increased time needed to complete tasks or assignments. °¨ Increased irritability or decreased ability to cope with stress. °¨ Increased  symptoms. °· Rest. Rest helps the brain to heal. Make sure you: °¨ Get plenty of sleep at night. Avoid staying up late at night. °¨ Keep the same bedtime hours on weekends and weekdays. °¨ Rest during the day. Take daytime naps or rest breaks when you feel tired. °· Limit activities that require a lot of thought or concentration. These include: °¨ Doing homework or job-related work. °¨ Watching TV. °¨ Working on the computer. °· Avoid any situation where there is potential for another head injury (football, hockey, soccer, basketball, martial arts, downhill snow sports and horseback riding). Your condition will get worse every time you experience a concussion. You should avoid these activities until you are evaluated by the appropriate follow-up health care providers. °Returning To Your Regular Activities °You will need to return to your normal activities slowly, not all at once. You must give your body and brain enough time for recovery. °· Do not return to sports or other athletic activities until your health care provider tells you it is safe to do so. °· Ask your health care provider when you can drive, ride a bicycle, or operate heavy machinery. Your ability to react may be slower after a brain injury. Never do these activities if you are dizzy. °· Ask your health   care provider about when you can return to work or school. Preventing Another Concussion It is very important to avoid another brain injury, especially before you have recovered. In rare cases, another injury can lead to permanent brain damage, brain swelling, or death. The risk of this is greatest during the first 7-10 days after a head injury. Avoid injuries by:  Wearing a seat belt when riding in a car.  Drinking alcohol only in moderation.  Wearing a helmet when biking, skiing, skateboarding, skating, or doing similar activities.  Avoiding activities that could lead to a second concussion, such as contact or recreational sports, until  your health care provider says it is okay.  Taking safety measures in your home.  Remove clutter and tripping hazards from floors and stairways.  Use grab bars in bathrooms and handrails by stairs.  Place non-slip mats on floors and in bathtubs.  Improve lighting in dim areas. SEEK MEDICAL CARE IF:  You have increased problems paying attention or concentrating.  You have increased difficulty remembering or learning new information.  You need more time to complete tasks or assignments than before.  You have increased irritability or decreased ability to cope with stress.  You have more symptoms than before. Seek medical care if you have any of the following symptoms for more than 2 weeks after your injury:  Lasting (chronic) headaches.  Dizziness or balance problems.  Nausea.  Vision problems.  Increased sensitivity to noise or light.  Depression or mood swings.  Anxiety or irritability.  Memory problems.  Difficulty concentrating or paying attention.  Sleep problems.  Feeling tired all the time. SEEK IMMEDIATE MEDICAL CARE IF:  You have severe or worsening headaches. These may be a sign of a blood clot in the brain.  You have weakness (even if only in one hand, leg, or part of the face).  You have numbness.  You have decreased coordination.  You vomit repeatedly.  You have increased sleepiness.  One pupil is larger than the other.  You have convulsions.  You have slurred speech.  You have increased confusion. This may be a sign of a blood clot in the brain.  You have increased restlessness, agitation, or irritability.  You are unable to recognize people or places.  You have neck pain.  It is difficult to wake you up.  You have unusual behavior changes.  You lose consciousness. MAKE SURE YOU:  Understand these instructions.  Will watch your condition.  Will get help right away if you are not doing well or get worse. Document  Released: 07/02/2003 Document Revised: 04/16/2013 Document Reviewed: 11/01/2012 [facility] Patient Information 2015 [place], [place]. This information is not intended to replace advice given to you by your health care provider. Make sure you discuss any questions you have with your health care provider.  Bicycling, Rules for Helmets Properly wearing a helmet when cycling is your best means of protection against injury. You need to know the right way to wear a helmet and what kind of helmet to buy. WEAR A HELMET  A helmet is your last line of defense in an accident; never ride without one.  Helmets can reduce serious head injuries by 85% in a crash. ALWAYS WEAR A PROPERLY FITTING HELMET  A helmet will not protect you if it does not fit properly.  Make sure that the helmet fits on top of the head, not tipped back.  Always wear a helmet while riding a bike, no matter how short the trip.  After a crash or any impact that affects your helmet, replace it immediately. SHELL AND PADS  Find the smallest helmet shell size that fits over your head.  Helmet pads should not be used to make a helmet fit your head that is otherwise too big.  Leave about two-fingers width between your eyebrows and the front brim of the helmet. STRAPS  The straps should be joined just under each ear at the jawbone.  The buckle should be snug with your mouth completely open.  Periodically check your strap adjustment. Improper fit can make a helmet useless. VENTILATION  In general, the more vents the better. Improper ventilation can cause overheating.  Helmets with good ventilation can actually be cooler than riding with no helmet at all.  More vents usually mean a higher priced helmet. Buy one that you will want to wear. COLORS  Helmets come in all different colors and models. Buy a highly visible color.  Shell color does not affect the temperature; black shell will not be hotter in the sun.  Pick a color  that encourages you to wear it. Document Released: 04/14/2003 Document Revised: 07/04/2011 Document Reviewed: 09/05/2008 [facility] Patient Information 2015 [place], [place]. This information is not intended to replace advice given to you by your health care provider. Make sure you discuss any questions you have with your health care provider.
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
## 2013-12-21 NOTE — ED Notes (Addendum)
Patient transported to CT 
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
## 2013-12-22 NOTE — ED Provider Notes (Signed)
Medical screening examination/treatment/procedure(s) were performed by non-physician practitioner and as supervising physician I was immediately available for consultation/collaboration.   EKG Interpretation None        Mirian Mo, MD 12/22/13 [phone]

## 2014-02-08 ENCOUNTER — Encounter (HOSPITAL_COMMUNITY): Payer: Self-pay | Admitting: Emergency Medicine

## 2014-02-08 ENCOUNTER — Emergency Department (HOSPITAL_COMMUNITY)
Admission: EM | Admit: 2014-02-08 | Discharge: 2014-02-08 | Disposition: A | Discharge status: Home / Self Care | Payer: Self-pay | Attending: Emergency Medicine | Admitting: Emergency Medicine

## 2014-02-08 ENCOUNTER — Emergency Department (HOSPITAL_COMMUNITY): Payer: Self-pay

## 2014-02-08 DIAGNOSIS — Z720 Tobacco use: Secondary | ICD-10-CM | POA: Insufficient documentation

## 2014-02-08 DIAGNOSIS — S2232XA Fracture of one rib, left side, initial encounter for closed fracture: Secondary | ICD-10-CM

## 2014-02-08 DIAGNOSIS — Z79899 Other long term (current) drug therapy: Secondary | ICD-10-CM | POA: Insufficient documentation

## 2014-02-08 DIAGNOSIS — S2242XA Multiple fractures of ribs, left side, initial encounter for closed fracture: Secondary | ICD-10-CM | POA: Insufficient documentation

## 2014-02-08 DIAGNOSIS — J45909 Unspecified asthma, uncomplicated: Secondary | ICD-10-CM | POA: Insufficient documentation

## 2014-02-08 DIAGNOSIS — Z8739 Personal history of other diseases of the musculoskeletal system and connective tissue: Secondary | ICD-10-CM | POA: Insufficient documentation
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
## 2014-02-08 MED ORDER — TRAMADOL HCL 50 MG PO TABS
50.0000 mg | ORAL_TABLET | Freq: Once | ORAL | Status: AC
  Administered 2014-02-08: 50 mg via ORAL
  Filled 2014-02-08: qty 1

## 2014-02-08 MED ORDER — NAPROXEN 500 MG PO TABS: 500.0000 mg | ORAL_TABLET | Freq: Two times a day (BID) | ORAL | Status: DC

## 2014-02-08 MED ORDER — HYDROCODONE-ACETAMINOPHEN 5-325 MG PO TABS: 1.0000 | ORAL_TABLET | Freq: Four times a day (QID) | ORAL | Status: DC | PRN

## 2014-02-08 NOTE — Discharge Instructions (Signed)
Rib Fracture °A rib fracture is a break or crack in one of the bones of the ribs. The ribs are a group of long, curved bones that wrap around your chest and attach to your spine. They protect your lungs and other organs in the chest cavity. A broken or cracked rib is often painful, but most do not cause other problems. Most rib fractures heal on their own over time. However, rib fractures can be more serious if multiple ribs are broken or if broken ribs move out of place and push against other structures. °CAUSES  °· A direct blow to the chest. For example, this could happen during contact sports, a car accident, or a fall against a hard object. °· Repetitive movements with high force, such as pitching a baseball or having severe coughing spells. °SYMPTOMS  °· Pain when you breathe in or cough. °· Pain when someone presses on the injured area. °DIAGNOSIS  °Your caregiver will perform a physical exam. Various imaging tests may be ordered to confirm the diagnosis and to look for related injuries. These tests may include a chest X-ray, computed tomography (CT), magnetic resonance imaging (MRI), or a bone scan. °TREATMENT  °Rib fractures usually heal on their own in 1-3 months. The longer healing period is often associated with a continued cough or other aggravating activities. During the healing period, pain control is very important. Medication is usually given to control pain. Hospitalization or surgery may be needed for more severe injuries, such as those in which multiple ribs are broken or the ribs have moved out of place.  °HOME CARE INSTRUCTIONS  °· Avoid strenuous activity and any activities or movements that cause pain. Be careful during activities and avoid bumping the injured rib. °· Gradually increase activity as directed by your caregiver. °· Only take over-the-counter or prescription medications as directed by your caregiver. Do not take other medications without asking your caregiver first. °· Apply ice  to the injured area for the first 1-2 days after you have been treated or as directed by your caregiver. Applying ice helps to reduce inflammation and pain. °¨ Put ice in a plastic bag. °¨ Place a towel between your skin and the bag.   °¨ Leave the ice on for 15-20 minutes at a time, every 2 hours while you are awake. °· Perform deep breathing as directed by your caregiver. This will help prevent pneumonia, which is a common complication of a broken rib. Your caregiver may instruct you to: °¨ Take deep breaths several times a day. °¨ Try to cough several times a day, holding a pillow against the injured area. °¨ Use a device called an incentive spirometer to practice deep breathing several times a day. °· Drink enough fluids to keep your urine clear or pale yellow. This will help you avoid constipation.   °· Do not wear a rib belt or binder. These restrict breathing, which can lead to pneumonia.   °SEEK IMMEDIATE MEDICAL CARE IF:  °· You have a fever.   °· You have difficulty breathing or shortness of breath.   °· You develop a continual cough, or you cough up thick or bloody sputum. °· You feel sick to your stomach (nausea), throw up (vomit), or have abdominal pain.   °· You have worsening pain not controlled with medications.   °MAKE SURE YOU: °· Understand these instructions. °· Will watch your condition. °· Will get help right away if you are not doing well or get worse. °Document Released: 04/11/2005 Document Revised: 12/12/2012 Document Reviewed:   06/13/2012 Exit[employer] Patient Information 2015 [place], [place]. This information is not intended to replace advice given to you by your health care provider. Make sure you discuss any questions you have with your health care provider.  Blunt Chest Trauma Blunt chest trauma is an injury caused by a blow to the chest. These chest injuries can be very painful. Blunt chest trauma often results in bruised or broken (fractured) ribs. Most cases of bruised and fractured  ribs from blunt chest traumas get better after 1 to 3 weeks of rest and pain medicine. Often, the soft tissue in the chest wall is also injured, causing pain and bruising. Internal organs, such as the heart and lungs, may also be injured. Blunt chest trauma can lead to serious medical problems. This injury requires immediate medical care. CAUSES   Motor vehicle collisions.  Falls.  Physical violence.  Sports injuries. SYMPTOMS   Chest pain. The pain may be worse when you move or breathe deeply.  Shortness of breath.  Lightheadedness.  Bruising.  Tenderness.  Swelling. DIAGNOSIS  Your caregiver will do a physical exam. X-rays may be taken to look for fractures. However, minor rib fractures may not show up on X-rays until a few days after the injury. If a more serious injury is suspected, further imaging tests may be done. This may include ultrasounds, computed tomography (CT) scans, or magnetic resonance imaging (MRI). TREATMENT  Treatment depends on the severity of your injury. Your caregiver may prescribe pain medicines and deep breathing exercises. HOME CARE INSTRUCTIONS  Limit your activities until you can move around without much pain.  Do not do any strenuous work until your injury is healed.  Put ice on the injured area.  Put ice in a plastic bag.  Place a towel between your skin and the bag.  Leave the ice on for 15-20 minutes, 03-04 times a day.  You may wear a rib belt as directed by your caregiver to reduce pain.  Practice deep breathing as directed by your caregiver to keep your lungs clear.  Only take over-the-counter or prescription medicines for pain, fever, or discomfort as directed by your caregiver. SEEK IMMEDIATE MEDICAL CARE IF:   You have increasing pain or shortness of breath.  You cough up blood.  You have nausea, vomiting, or abdominal pain.  You have a fever.  You feel dizzy, weak, or you faint. MAKE SURE YOU:  Understand these  instructions.  Will watch your condition.  Will get help right away if you are not doing well or get worse. Document Released: 05/19/2004 Document Revised: 07/04/2011 Document Reviewed: 01/26/2011 [facility]Exit[facility] Patient Information 2015 [place], [place]. This information is not intended to replace advice given to you by your health care provider. Make sure you discuss any questions you have with your health care provider.

## 2014-02-08 NOTE — ED Notes (Signed)
Pt provided with bus pass and a happy meal

## 2014-02-08 NOTE — ED Notes (Signed)
Declined W/C at D/C and was escorted to lobby by RN. 
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
## 2014-02-08 NOTE — ED Notes (Signed)
pts vital signs updated pt awaiting discharge paperwork at bedside.  
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
## 2014-02-09 NOTE — ED Provider Notes (Signed)
Medical screening examination/treatment/procedure(s) were performed by non-physician practitioner and as supervising physician I was immediately available for consultation/collaboration.  Flint MelterElliott L Lilac Hoff, MD 02/09/14 [phone]

## 2014-02-27 ENCOUNTER — Encounter (HOSPITAL_COMMUNITY): Payer: Self-pay | Admitting: Emergency Medicine

## 2014-02-27 DIAGNOSIS — Z79899 Other long term (current) drug therapy: Secondary | ICD-10-CM | POA: Insufficient documentation

## 2014-02-27 DIAGNOSIS — S2232XD Fracture of one rib, left side, subsequent encounter for fracture with routine healing: Secondary | ICD-10-CM | POA: Insufficient documentation

## 2014-02-27 DIAGNOSIS — X58XXXD Exposure to other specified factors, subsequent encounter: Secondary | ICD-10-CM | POA: Insufficient documentation

## 2014-02-27 DIAGNOSIS — Z791 Long term (current) use of non-steroidal anti-inflammatories (NSAID): Secondary | ICD-10-CM | POA: Insufficient documentation

## 2014-02-27 DIAGNOSIS — Z720 Tobacco use: Secondary | ICD-10-CM | POA: Insufficient documentation

## 2014-02-27 DIAGNOSIS — J45909 Unspecified asthma, uncomplicated: Secondary | ICD-10-CM | POA: Insufficient documentation

## 2014-02-27 DIAGNOSIS — M109 Gout, unspecified: Secondary | ICD-10-CM | POA: Insufficient documentation

## 2014-02-27 DIAGNOSIS — M1990 Unspecified osteoarthritis, unspecified site: Secondary | ICD-10-CM | POA: Insufficient documentation

## 2014-02-27 NOTE — ED Notes (Signed)
Pt. reports persistent left lateral ribcage pain onset 2 weeks ago , seen here last Oct. 17 diagnosed with left rib fracture discharged home with prescription pain medications  , respirations unlabored , pain worse with palpation and movement .

## 2014-02-28 ENCOUNTER — Emergency Department (HOSPITAL_COMMUNITY)
Admission: EM | Admit: 2014-02-28 | Discharge: 2014-02-28 | Disposition: A | Discharge status: Home / Self Care | Payer: No Typology Code available for payment source | Attending: Emergency Medicine | Admitting: Emergency Medicine

## 2014-02-28 ENCOUNTER — Emergency Department (HOSPITAL_COMMUNITY): Payer: No Typology Code available for payment source | Attending: Emergency Medicine

## 2014-02-28 DIAGNOSIS — S2232XD Fracture of one rib, left side, subsequent encounter for fracture with routine healing: Secondary | ICD-10-CM

## 2014-02-28 DIAGNOSIS — R52 Pain, unspecified: Secondary | ICD-10-CM

## 2014-02-28 HISTORY — DX: Fracture of one rib, unspecified side, initial encounter for closed fracture: S22.39XA

## 2014-02-28 IMAGING — CR DG RIBS W/ CHEST 3+V*L*
5 series · 5 of 5 positions shown · non-contrast
Comparison: [DATE]

CLINICAL DATA: Persistent left-sided chest wall pain, began 2 weeks
ago. History of left-sided rib fracture diagnosed in [REDACTED].
Followup for healing.

EXAM:
LEFT RIBS AND CHEST - 3+ VIEW

[w chest pa]
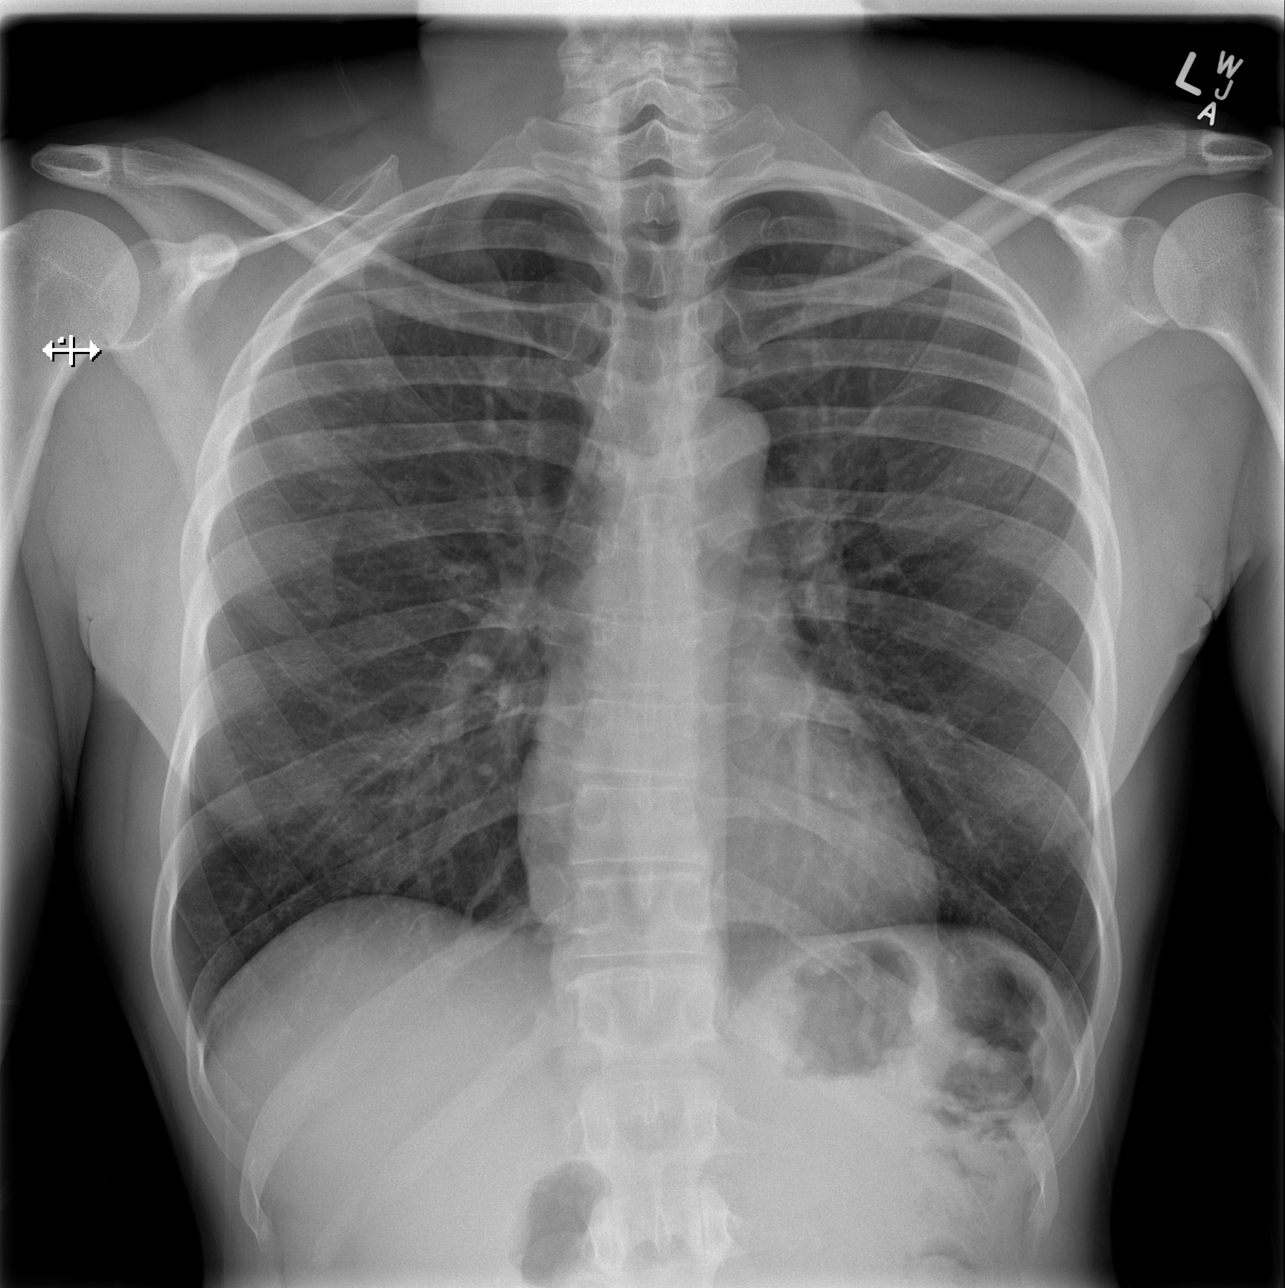

[w ribs ap upper left]
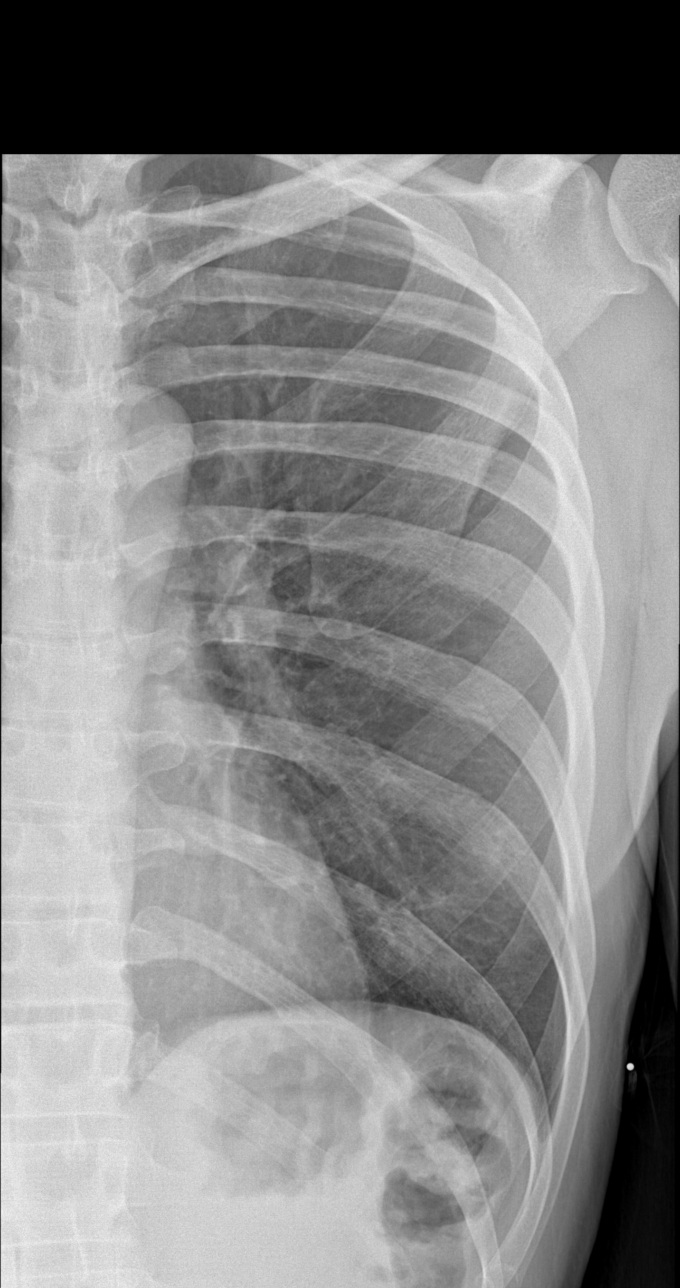

[w ribs ap lower left]
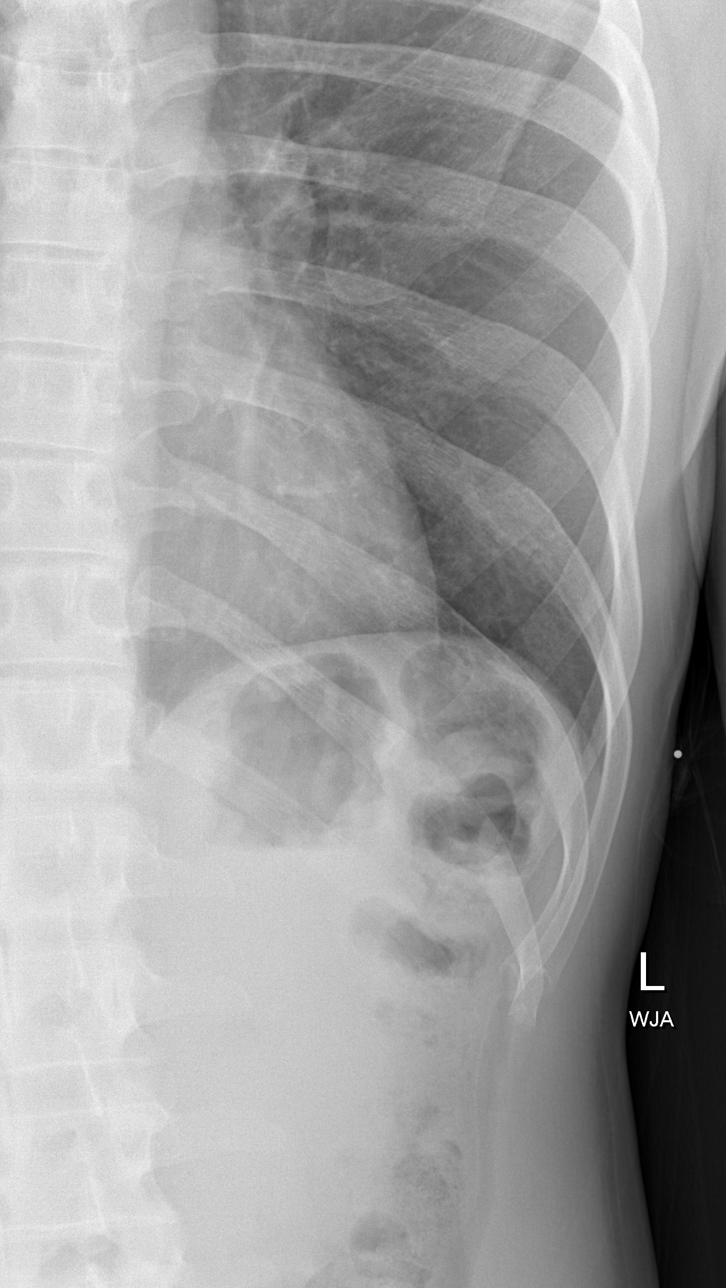

[w ribs obl left (1 of 2)]
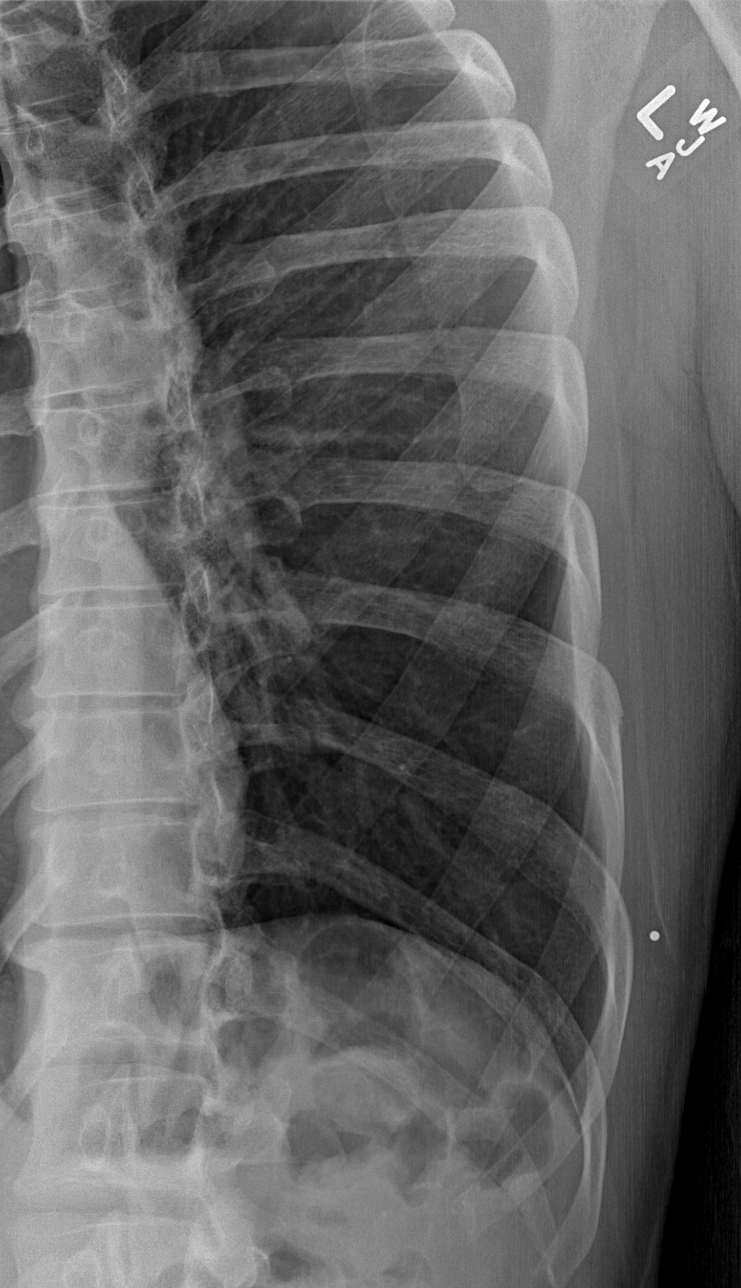

[w ribs obl left (2 of 2)]
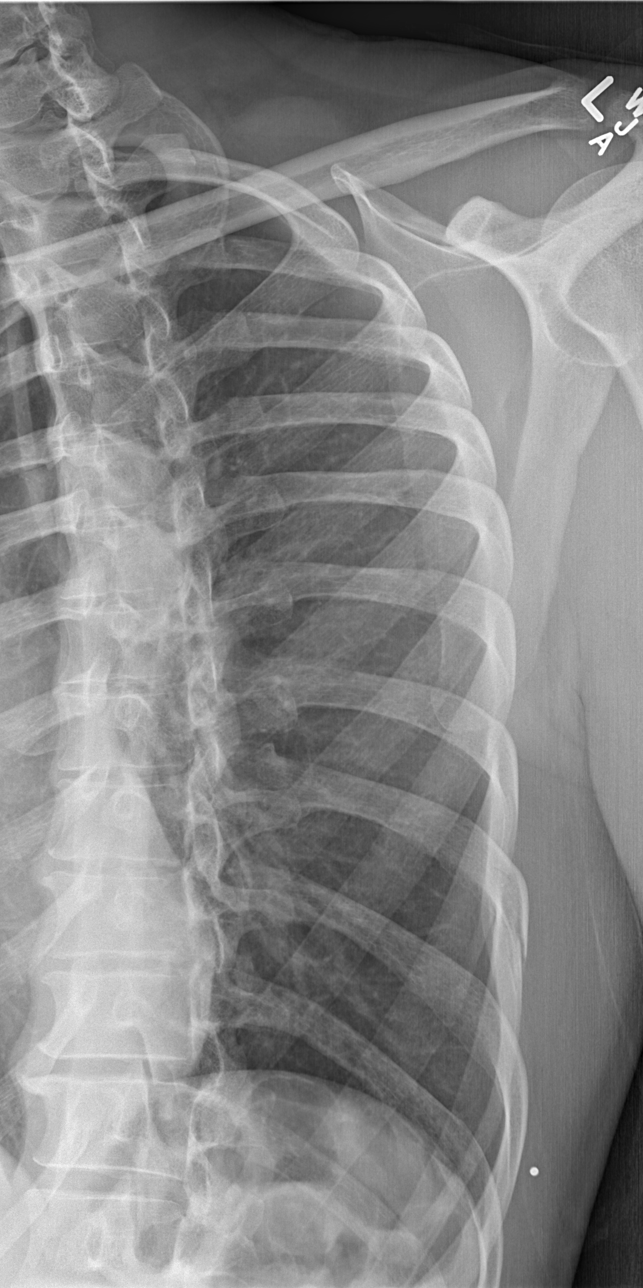

[5 of 5 positions shown; findings below may reference images not displayed]

FINDINGS: Heart size remains normal. Mediastinal shadows are normal. The lungs
are clear. No effusions. No pneumothorax. Mild curvature of the
spine.

Previously seen nondisplaced fracture of the posterior lateral left
ninth rib is healing and more difficulty to demonstrate. This is not
completely healed at this time. None of the other ribs appears
abnormal.
IMPRESSION: No active cardiopulmonary disease. Healing fracture of the left
posterior lateral ninth rib without complicating feature by
radiography.

## 2014-02-28 MED ORDER — HYDROCODONE-ACETAMINOPHEN 5-325 MG PO TABS: 1.0000 | ORAL_TABLET | Freq: Four times a day (QID) | ORAL | Status: DC | PRN

## 2014-02-28 MED ORDER — NAPROXEN 500 MG PO TABS: 500.0000 mg | ORAL_TABLET | Freq: Two times a day (BID) | ORAL | Status: DC | PRN

## 2014-02-28 MED ORDER — KETOROLAC TROMETHAMINE 60 MG/2ML IM SOLN
60.0000 mg | Freq: Once | INTRAMUSCULAR | Status: AC
  Administered 2014-02-28: 60 mg via INTRAMUSCULAR
  Filled 2014-02-28: qty 2

## 2014-02-28 NOTE — Discharge Instructions (Signed)
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
## 2014-11-15 ENCOUNTER — Encounter (HOSPITAL_COMMUNITY): Payer: Self-pay | Admitting: Emergency Medicine

## 2014-11-15 ENCOUNTER — Emergency Department (HOSPITAL_COMMUNITY): Payer: Self-pay

## 2014-11-15 ENCOUNTER — Emergency Department (HOSPITAL_COMMUNITY)
Admission: EM | Admit: 2014-11-15 | Discharge: 2014-11-15 | Disposition: A | Discharge status: Home / Self Care | Payer: Self-pay | Attending: Emergency Medicine | Admitting: Emergency Medicine

## 2014-11-15 DIAGNOSIS — Y999 Unspecified external cause status: Secondary | ICD-10-CM | POA: Insufficient documentation

## 2014-11-15 DIAGNOSIS — J45909 Unspecified asthma, uncomplicated: Secondary | ICD-10-CM | POA: Insufficient documentation

## 2014-11-15 DIAGNOSIS — Z8739 Personal history of other diseases of the musculoskeletal system and connective tissue: Secondary | ICD-10-CM | POA: Insufficient documentation

## 2014-11-15 DIAGNOSIS — Y929 Unspecified place or not applicable: Secondary | ICD-10-CM | POA: Insufficient documentation

## 2014-11-15 DIAGNOSIS — Y939 Activity, unspecified: Secondary | ICD-10-CM | POA: Insufficient documentation

## 2014-11-15 DIAGNOSIS — S7002XA Contusion of left hip, initial encounter: Secondary | ICD-10-CM | POA: Insufficient documentation

## 2014-11-15 DIAGNOSIS — Z8781 Personal history of (healed) traumatic fracture: Secondary | ICD-10-CM | POA: Insufficient documentation

## 2014-11-15 DIAGNOSIS — Z720 Tobacco use: Secondary | ICD-10-CM | POA: Insufficient documentation
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
## 2014-11-15 MED ORDER — OXYCODONE-ACETAMINOPHEN 5-325 MG PO TABS
1.0000 | ORAL_TABLET | Freq: Once | ORAL | Status: AC
  Administered 2014-11-15: 1 via ORAL
  Filled 2014-11-15: qty 1
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
## 2014-11-15 NOTE — ED Notes (Signed)
Patient transported to X-ray 
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
## 2014-11-15 NOTE — Discharge Instructions (Signed)
Iliac Crest Contusion  An iliac crest contusion is a deep bruise of your hip bone (hip pointer). Contusions are the result of an injury that caused bleeding under the skin. The contusion may turn blue, purple, or yellow. Minor injuries will give you a painless contusion, but more severe contusions may stay painful and swollen for a few weeks.   CAUSES   An iliac crest contusion is usually caused by a blow to the top of your hip pointer. The injury most often comes from contact sports.   SYMPTOMS    Swelling and redness of the hip area.   Bruising of the hip area.   Tenderness or soreness of the hip.  DIAGNOSIS   The diagnosis can be made by taking your history and doing a physical exam. You may need an X-ray of your hip pointer to look for a broken bone (fracture).  TREATMENT   Often, the best treatment for an iliac crest contusion is resting, icing, elevating, and applying cold compresses to the injured area. Over-the-counter medicines may also be recommended for pain control. Crutches may be used if walking is very painful. Some people need physical therapy to help with range of motion and strengthening.   HOME CARE INSTRUCTIONS    Put ice on the injured area.   Put ice in a plastic bag.   Place a towel between your skin and the bag.   Leave the ice on for 15-20 minutes, 03-04 times a day.   Only take over-the-counter or prescription medicines for pain, discomfort, or fever as directed by your caregiver. Your caregiver may recommend avoiding anti-inflammatory medicines (aspirin, ibuprofen, and naproxen) for 48 hours because these medicines may increase bruising.   Keep your leg straightened (extended) when possible.   Walk or move around as the pain allows, or as directed by your caregiver. Use crutches if your caregiver recommends them.   Apply compression wraps as directed by your caregiver. You may remove the wraps for sleeping, showers, and baths.  SEEK IMMEDIATE MEDICAL CARE IF:    You have  increased bruising or swelling.   You have pain that is getting worse.   Your swelling or pain is not relieved with medicines.   Your toes get cold.  MAKE SURE YOU:    Understand these instructions.   Will watch your condition.   Will get help right away if you are not doing well or get worse.  Document Released: 01/04/2001 Document Revised: 07/04/2011 Document Reviewed: 02/25/2011  [employer] Patient Information 2015 [employer]. This information is not intended to replace advice given to you by your health care provider. Make sure you discuss any questions you have with your health care provider.

## 2014-12-28 ENCOUNTER — Emergency Department (HOSPITAL_COMMUNITY)
Admission: EM | Admit: 2014-12-28 | Discharge: 2014-12-28 | Disposition: A | Discharge status: Home / Self Care | Payer: Self-pay | Attending: Emergency Medicine | Admitting: Emergency Medicine

## 2014-12-28 ENCOUNTER — Encounter (HOSPITAL_COMMUNITY): Payer: Self-pay | Admitting: *Deleted

## 2014-12-28 DIAGNOSIS — S7012XA Contusion of left thigh, initial encounter: Secondary | ICD-10-CM | POA: Insufficient documentation

## 2014-12-28 DIAGNOSIS — Z79899 Other long term (current) drug therapy: Secondary | ICD-10-CM | POA: Insufficient documentation

## 2014-12-28 DIAGNOSIS — M1990 Unspecified osteoarthritis, unspecified site: Secondary | ICD-10-CM | POA: Insufficient documentation

## 2014-12-28 DIAGNOSIS — T07XXXA Unspecified multiple injuries, initial encounter: Secondary | ICD-10-CM

## 2014-12-28 DIAGNOSIS — Z720 Tobacco use: Secondary | ICD-10-CM | POA: Insufficient documentation

## 2014-12-28 DIAGNOSIS — Y9289 Other specified places as the place of occurrence of the external cause: Secondary | ICD-10-CM | POA: Insufficient documentation

## 2014-12-28 DIAGNOSIS — J45909 Unspecified asthma, uncomplicated: Secondary | ICD-10-CM | POA: Insufficient documentation

## 2014-12-28 DIAGNOSIS — Y9389 Activity, other specified: Secondary | ICD-10-CM | POA: Insufficient documentation

## 2014-12-28 DIAGNOSIS — Y998 Other external cause status: Secondary | ICD-10-CM | POA: Insufficient documentation

## 2014-12-28 DIAGNOSIS — S0083XA Contusion of other part of head, initial encounter: Secondary | ICD-10-CM | POA: Insufficient documentation

## 2014-12-28 MED ORDER — NAPROXEN 500 MG PO TABS
500.0000 mg | ORAL_TABLET | Freq: Once | ORAL | Status: AC
  Administered 2014-12-28: 500 mg via ORAL
  Filled 2014-12-28: qty 1

## 2014-12-28 MED ORDER — NAPROXEN 500 MG PO TABS: 500.0000 mg | ORAL_TABLET | Freq: Two times a day (BID) | ORAL | Status: DC
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
## 2014-12-28 NOTE — Discharge Instructions (Signed)
Contusion °A contusion is a deep bruise. Contusions are the result of an injury that caused bleeding under the skin. The contusion may turn blue, purple, or yellow. Minor injuries will give you a painless contusion, but more severe contusions may stay painful and swollen for a few weeks.  °CAUSES  °A contusion is usually caused by a blow, trauma, or direct force to an area of the body. °SYMPTOMS  °· Swelling and redness of the injured area. °· Bruising of the injured area. °· Tenderness and soreness of the injured area. °· Pain. °DIAGNOSIS  °The diagnosis can be made by taking a history and physical exam. An X-ray, CT scan, or MRI may be needed to determine if there were any associated injuries, such as fractures. °TREATMENT  °Specific treatment will depend on what area of the body was injured. In general, the best treatment for a contusion is resting, icing, elevating, and applying cold compresses to the injured area. Over-the-counter medicines may also be recommended for pain control. Ask your caregiver what the best treatment is for your contusion. °HOME CARE INSTRUCTIONS  °· Put ice on the injured area. °¨ Put ice in a plastic bag. °¨ Place a towel between your skin and the bag. °¨ Leave the ice on for 15-20 minutes, 3-4 times a day, or as directed by your health care provider. °· Only take over-the-counter or prescription medicines for pain, discomfort, or fever as directed by your caregiver. Your caregiver may recommend avoiding anti-inflammatory medicines (aspirin, ibuprofen, and naproxen) for 48 hours because these medicines may increase bruising. °· Rest the injured area. °· If possible, elevate the injured area to reduce swelling. °SEEK IMMEDIATE MEDICAL CARE IF:  °· You have increased bruising or swelling. °· You have pain that is getting worse. °· Your swelling or pain is not relieved with medicines. °MAKE SURE YOU:  °· Understand these instructions. °· Will watch your condition. °· Will get help right  away if you are not doing well or get worse. °Document Released: 01/19/2005 Document Revised: 04/16/2013 Document Reviewed: 02/14/2011 °[employer]® Patient Information ©2015 [employer]. This information is not intended to replace advice given to you by your health care provider. Make sure you discuss any questions you have with your health care provider. ° °
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
## 2015-01-04 ENCOUNTER — Encounter (HOSPITAL_COMMUNITY): Payer: Self-pay

## 2015-01-04 ENCOUNTER — Emergency Department (HOSPITAL_COMMUNITY)
Admission: EM | Admit: 2015-01-04 | Discharge: 2015-01-04 | Discharge status: Against Medical Advice | Payer: No Typology Code available for payment source | Attending: Emergency Medicine | Admitting: Emergency Medicine

## 2015-01-04 DIAGNOSIS — Y9389 Activity, other specified: Secondary | ICD-10-CM | POA: Diagnosis not present

## 2015-01-04 DIAGNOSIS — S99911A Unspecified injury of right ankle, initial encounter: Secondary | ICD-10-CM | POA: Insufficient documentation

## 2015-01-04 DIAGNOSIS — Z720 Tobacco use: Secondary | ICD-10-CM | POA: Diagnosis not present

## 2015-01-04 DIAGNOSIS — S4991XA Unspecified injury of right shoulder and upper arm, initial encounter: Secondary | ICD-10-CM | POA: Insufficient documentation

## 2015-01-04 DIAGNOSIS — Y92410 Unspecified street and highway as the place of occurrence of the external cause: Secondary | ICD-10-CM | POA: Diagnosis not present

## 2015-01-04 DIAGNOSIS — Y998 Other external cause status: Secondary | ICD-10-CM | POA: Insufficient documentation
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
## 2015-06-19 ENCOUNTER — Emergency Department (HOSPITAL_COMMUNITY): Payer: Self-pay

## 2015-06-19 ENCOUNTER — Emergency Department (HOSPITAL_COMMUNITY): Payer: No Typology Code available for payment source

## 2015-06-19 ENCOUNTER — Encounter (HOSPITAL_COMMUNITY): Payer: Self-pay | Admitting: Radiology

## 2015-06-19 ENCOUNTER — Emergency Department (HOSPITAL_COMMUNITY)
Admission: EM | Admit: 2015-06-19 | Discharge: 2015-06-19 | Disposition: A | Discharge status: Home / Self Care | Payer: Self-pay | Attending: Emergency Medicine | Admitting: Emergency Medicine

## 2015-06-19 DIAGNOSIS — F17210 Nicotine dependence, cigarettes, uncomplicated: Secondary | ICD-10-CM | POA: Insufficient documentation

## 2015-06-19 DIAGNOSIS — Z8781 Personal history of (healed) traumatic fracture: Secondary | ICD-10-CM | POA: Insufficient documentation

## 2015-06-19 DIAGNOSIS — W268XXA Contact with other sharp object(s), not elsewhere classified, initial encounter: Secondary | ICD-10-CM | POA: Insufficient documentation

## 2015-06-19 DIAGNOSIS — S21112A Laceration without foreign body of left front wall of thorax without penetration into thoracic cavity, initial encounter: Secondary | ICD-10-CM | POA: Insufficient documentation

## 2015-06-19 DIAGNOSIS — J45909 Unspecified asthma, uncomplicated: Secondary | ICD-10-CM | POA: Insufficient documentation

## 2015-06-19 DIAGNOSIS — Z791 Long term (current) use of non-steroidal anti-inflammatories (NSAID): Secondary | ICD-10-CM | POA: Insufficient documentation

## 2015-06-19 DIAGNOSIS — Y9389 Activity, other specified: Secondary | ICD-10-CM | POA: Insufficient documentation

## 2015-06-19 DIAGNOSIS — S2191XA Laceration without foreign body of unspecified part of thorax, initial encounter: Secondary | ICD-10-CM

## 2015-06-19 DIAGNOSIS — Y9289 Other specified places as the place of occurrence of the external cause: Secondary | ICD-10-CM | POA: Insufficient documentation

## 2015-06-19 DIAGNOSIS — Z79899 Other long term (current) drug therapy: Secondary | ICD-10-CM | POA: Insufficient documentation

## 2015-06-19 DIAGNOSIS — S41112A Laceration without foreign body of left upper arm, initial encounter: Secondary | ICD-10-CM | POA: Insufficient documentation

## 2015-06-19 DIAGNOSIS — Y998 Other external cause status: Secondary | ICD-10-CM | POA: Insufficient documentation

## 2015-06-19 DIAGNOSIS — M1990 Unspecified osteoarthritis, unspecified site: Secondary | ICD-10-CM | POA: Insufficient documentation

## 2015-06-19 LAB — I-STAT CHEM 8, ED
BUN: 12 mg/dL (ref 6–20)
CHLORIDE: 103 mmol/L (ref 101–111)
Calcium, Ion: 1.13 mmol/L (ref 1.12–1.23)
Creatinine, Ser: 1.5 mg/dL — ABNORMAL HIGH (ref 0.61–1.24)
Glucose, Bld: 77 mg/dL (ref 65–99)
HCT: 41 % (ref 39.0–52.0)
HEMOGLOBIN: 13.9 g/dL (ref 13.0–17.0)
POTASSIUM: 4.2 mmol/L (ref 3.5–5.1)
Sodium: 140 mmol/L (ref 135–145)
TCO2: 24 mmol/L (ref 0–100)
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
## 2015-06-19 MED ORDER — FENTANYL CITRATE (PF) 100 MCG/2ML IJ SOLN
50.0000 ug | Freq: Once | INTRAMUSCULAR | Status: AC
  Administered 2015-06-19: 50 ug via INTRAVENOUS
  Filled 2015-06-19: qty 2

## 2015-06-19 MED ORDER — TETANUS-DIPHTH-ACELL PERTUSSIS 5-2.5-18.5 LF-MCG/0.5 IM SUSP
0.5000 mL | Freq: Once | INTRAMUSCULAR | Status: AC
  Administered 2015-06-19: 0.5 mL via INTRAMUSCULAR
  Filled 2015-06-19: qty 0.5

## 2015-06-19 MED ORDER — IOHEXOL 300 MG/ML  SOLN
75.0000 mL | Freq: Once | INTRAMUSCULAR | Status: AC | PRN
  Administered 2015-06-19: 75 mL via INTRAVENOUS

## 2015-06-19 MED ORDER — IBUPROFEN 200 MG PO TABS
600.0000 mg | ORAL_TABLET | Freq: Once | ORAL | Status: AC
  Administered 2015-06-19: 600 mg via ORAL
  Filled 2015-06-19: qty 3
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
## 2015-06-19 NOTE — Discharge Instructions (Signed)
Wound Care °Taking care of your wound properly can help to prevent pain and infection. It can also help your wound to heal more quickly.  °HOW TO CARE FOR YOUR WOUND  °· Take or apply over-the-counter and prescription medicines only as told by your health care provider. °· If you were prescribed antibiotic medicine, take or apply it as told by your health care provider. Do not stop using the antibiotic even if your condition improves. °· Clean the wound each day or as told by your health care provider. °· Wash the wound with mild soap and water. °· Rinse the wound with water to remove all soap. °· Pat the wound dry with a clean towel. Do not rub it. °· There are many different ways to close and cover a wound. For example, a wound can be covered with stitches (sutures), skin glue, or adhesive strips. Follow instructions from your health care provider about: °· How to take care of your wound. °· When and how you should change your bandage (dressing). °· When you should remove your dressing. °· Removing whatever was used to close your wound. °· Check your wound every day for signs of infection. Watch for: °· Redness, swelling, or pain. °· Fluid, blood, or pus. °· Keep the dressing dry until your health care provider says it can be removed. Do not take baths, swim, use a hot tub, or do anything that would put your wound underwater until your health care provider approves. °· Raise (elevate) the injured area above the level of your heart while you are sitting or lying down. °· Do not scratch or pick at the wound. °· Keep all follow-up visits as told by your health care provider. This is important. °SEEK MEDICAL CARE IF: °· You received a tetanus shot and you have swelling, severe pain, redness, or bleeding at the injection site. °· You have a fever. °· Your pain is not controlled with medicine. °· You have increased redness, swelling, or pain at the site of your wound. °· You have fluid, blood, or pus coming from your  wound. °· You notice a bad smell coming from your wound or your dressing. °SEEK IMMEDIATE MEDICAL CARE IF: °· You have a red streak going away from your wound. °  °This information is not intended to replace advice given to you by your health care provider. Make sure you discuss any questions you have with your health care provider. °  °Document Released: 01/19/2008 Document Revised: 08/26/2014 Document Reviewed: 04/07/2014 °Elsevier Interactive Patient Education ©2016 [employer] ° °Stitches, Staples, or Adhesive Wound Closure °Health care providers use stitches (sutures), staples, and certain glue (skin adhesives) to hold skin together while it heals (wound closure). You may need this treatment after you have surgery or if you cut your skin accidentally. These methods help your skin to heal more quickly and make it less likely that you will have a scar. A wound may take several months to heal completely. °The type of wound you have determines when your wound gets closed. In most cases, the wound is closed as soon as possible (primary skin closure). Sometimes, closure is delayed so the wound can be cleaned and allowed to heal naturally. This reduces the chance of infection. Delayed closure may be needed if your wound: °· Is caused by a bite. °· Happened more than 6 hours ago. °· Involves loss of skin or the tissues under the skin. °· Has dirt or debris in it that cannot be removed. °·   Is infected. °WHAT ARE THE DIFFERENT KINDS OF WOUND CLOSURES? °There are many options for wound closure. The one that your health care provider uses depends on how deep and how large your wound is. °Adhesive Glue °To use this type of glue to close a wound, your health care provider holds the edges of the wound together and paints the glue on the surface of your skin. You may need more than one layer of glue. Then the wound may be covered with a light bandage (dressing). °This type of skin closure may be used for small wounds that  are not deep (superficial). Using glue for wound closure is less painful than other methods. It does not require a medicine that numbs the area (local anesthetic). This method also leaves nothing to be removed. Adhesive glue is often used for children and on facial wounds. °Adhesive glue cannot be used for wounds that are deep, uneven, or bleeding. It is not used inside of a wound.  °Adhesive Strips °These strips are made of sticky (adhesive), porous paper. They are applied across your skin edges like a regular adhesive bandage. You leave them on until they fall off. °Adhesive strips may be used to close very superficial wounds. They may also be used along with sutures to improve the closure of your skin edges.  °Sutures °Sutures are the oldest method of wound closure. Sutures can be made from natural substances, such as silk, or from synthetic materials, such as nylon and steel. They can be made from a material that your body can break down as your wound heals (absorbable), or they can be made from a material that needs to be removed from your skin (nonabsorbable). They come in many different strengths and sizes. °Your health care provider attaches the sutures to a steel needle on one end. Sutures can be passed through your skin, or through the tissues beneath your skin. Then they are tied and cut. Your skin edges may be closed in one continuous stitch or in separate stitches. °Sutures are strong and can be used for all kinds of wounds. Absorbable sutures may be used to close tissues under the skin. The disadvantage of sutures is that they may cause skin reactions that lead to infection. Nonabsorbable sutures need to be removed. °Staples °When surgical staples are used to close a wound, the edges of your skin on both sides of the wound are brought close together. A staple is placed across the wound, and an instrument secures the edges together. Staples are often used to close surgical cuts (incisions). °Staples are  faster to use than sutures, and they cause less skin reaction. Staples need to be removed using a tool that bends the staples away from your skin. °HOW DO I CARE FOR MY WOUND CLOSURE? °· Take medicines only as directed by your health care provider. °· If you were prescribed an antibiotic medicine for your wound, finish it all even if you start to feel better. °· Use ointments or creams only as directed by your health care provider. °· Wash your hands with soap and water before and after touching your wound. °· Do not soak your wound in water. Do not take baths, swim, or use a hot tub until your health care provider approves. °· Ask your health care provider when you can start showering. Cover your wound if directed by your health care provider. °· Do not take out your own sutures or staples. °· Do not pick at your wound. Picking can cause   an infection. °· Keep all follow-up visits as directed by your health care provider. This is important. °HOW LONG WILL I HAVE MY WOUND CLOSURE? °· Leave adhesive glue on your skin until the glue peels away. °· Leave adhesive strips on your skin until the strips fall off. °· Absorbable sutures will dissolve within several days. °· Nonabsorbable sutures and staples must be removed. The location of the wound will determine how long they stay in. This can range from several days to a couple of weeks. °WHEN SHOULD I SEEK HELP FOR MY WOUND CLOSURE? °Contact your health care provider if: °· You have a fever. °· You have chills. °· You have drainage, redness, swelling, or pain at your wound. °· There is a bad smell coming from your wound. °· The skin edges of your wound start to separate after your sutures have been removed. °· Your wound becomes thick, raised, and darker in color after your sutures come out (scarring). °  °This information is not intended to replace advice given to you by your health care provider. Make sure you discuss any questions you have with your health care  provider. °  °Document Released: 01/04/2001 Document Revised: 05/02/2014 Document Reviewed: 09/18/2013 °Elsevier Interactive Patient Education ©2016 [employer] Inc[employer] ° °
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
## 2015-07-27 ENCOUNTER — Encounter (HOSPITAL_COMMUNITY): Payer: Self-pay | Admitting: Adult Health

## 2015-07-27 ENCOUNTER — Emergency Department (HOSPITAL_COMMUNITY)
Admission: EM | Admit: 2015-07-27 | Discharge: 2015-07-27 | Disposition: A | Discharge status: Home / Self Care | Payer: No Typology Code available for payment source | Attending: Emergency Medicine | Admitting: Emergency Medicine

## 2015-07-27 DIAGNOSIS — J45909 Unspecified asthma, uncomplicated: Secondary | ICD-10-CM | POA: Insufficient documentation

## 2015-07-27 DIAGNOSIS — Z8781 Personal history of (healed) traumatic fracture: Secondary | ICD-10-CM | POA: Insufficient documentation

## 2015-07-27 DIAGNOSIS — M1990 Unspecified osteoarthritis, unspecified site: Secondary | ICD-10-CM | POA: Insufficient documentation

## 2015-07-27 DIAGNOSIS — Z4802 Encounter for removal of sutures: Secondary | ICD-10-CM | POA: Insufficient documentation

## 2015-07-27 DIAGNOSIS — F17210 Nicotine dependence, cigarettes, uncomplicated: Secondary | ICD-10-CM | POA: Insufficient documentation

## 2015-07-27 DIAGNOSIS — Z79899 Other long term (current) drug therapy: Secondary | ICD-10-CM | POA: Insufficient documentation

## 2015-07-27 DIAGNOSIS — Z791 Long term (current) use of non-steroidal anti-inflammatories (NSAID): Secondary | ICD-10-CM | POA: Insufficient documentation

## 2015-07-27 DIAGNOSIS — M109 Gout, unspecified: Secondary | ICD-10-CM | POA: Insufficient documentation

## 2015-07-27 NOTE — Discharge Instructions (Signed)
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
## 2018-01-07 ENCOUNTER — Other Ambulatory Visit: Payer: Self-pay

## 2018-01-07 ENCOUNTER — Emergency Department (HOSPITAL_COMMUNITY)
Admission: EM | Admit: 2018-01-07 | Discharge: 2018-01-07 | Disposition: A | Discharge status: Home / Self Care | Payer: Self-pay | Attending: Emergency Medicine | Admitting: Emergency Medicine

## 2018-01-07 ENCOUNTER — Encounter (HOSPITAL_COMMUNITY): Payer: Self-pay | Admitting: Emergency Medicine

## 2018-01-07 DIAGNOSIS — R079 Chest pain, unspecified: Secondary | ICD-10-CM | POA: Insufficient documentation

## 2018-01-07 DIAGNOSIS — Z5321 Procedure and treatment not carried out due to patient leaving prior to being seen by health care provider: Secondary | ICD-10-CM | POA: Insufficient documentation
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
## 2018-01-08 ENCOUNTER — Other Ambulatory Visit: Payer: Self-pay

## 2018-01-08 ENCOUNTER — Emergency Department (HOSPITAL_COMMUNITY): Payer: Self-pay

## 2018-01-08 ENCOUNTER — Encounter (HOSPITAL_COMMUNITY): Payer: Self-pay | Admitting: Emergency Medicine

## 2018-01-08 ENCOUNTER — Emergency Department (HOSPITAL_COMMUNITY)
Admission: EM | Admit: 2018-01-08 | Discharge: 2018-01-08 | Disposition: A | Discharge status: Home / Self Care | Payer: Self-pay | Attending: Emergency Medicine | Admitting: Emergency Medicine

## 2018-01-08 DIAGNOSIS — J45909 Unspecified asthma, uncomplicated: Secondary | ICD-10-CM | POA: Insufficient documentation

## 2018-01-08 DIAGNOSIS — R0789 Other chest pain: Secondary | ICD-10-CM | POA: Insufficient documentation

## 2018-01-08 DIAGNOSIS — F17210 Nicotine dependence, cigarettes, uncomplicated: Secondary | ICD-10-CM | POA: Insufficient documentation

## 2018-01-08 DIAGNOSIS — M25551 Pain in right hip: Secondary | ICD-10-CM | POA: Insufficient documentation

## 2018-01-08 DIAGNOSIS — Z79899 Other long term (current) drug therapy: Secondary | ICD-10-CM | POA: Insufficient documentation
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
## 2018-01-08 MED ORDER — NAPROXEN 500 MG PO TABS: 500.0000 mg | ORAL_TABLET | Freq: Two times a day (BID) | ORAL | 0 refills | Status: AC
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
## 2018-01-08 NOTE — Discharge Instructions (Addendum)
Take naproxen as needed as prescribed for pain. Your x-rays today do not show any broken ribs or bony problems in your hip.  Likely muscle soreness.  Follow-up with PCP, referral given if needed.

## 2018-12-05 ENCOUNTER — Other Ambulatory Visit: Payer: Self-pay

## 2018-12-05 ENCOUNTER — Ambulatory Visit (HOSPITAL_COMMUNITY)
Admission: EM | Admit: 2018-12-05 | Discharge: 2018-12-05 | Disposition: A | Discharge status: Home / Self Care | Payer: Medicaid Other | Attending: Family Medicine | Admitting: Family Medicine

## 2018-12-05 ENCOUNTER — Encounter (HOSPITAL_COMMUNITY): Payer: Self-pay | Admitting: Emergency Medicine

## 2018-12-05 DIAGNOSIS — S61211A Laceration without foreign body of left index finger without damage to nail, initial encounter: Secondary | ICD-10-CM

## 2018-12-05 DIAGNOSIS — W208XXA Other cause of strike by thrown, projected or falling object, initial encounter: Secondary | ICD-10-CM

## 2018-12-05 DIAGNOSIS — S61213A Laceration without foreign body of left middle finger without damage to nail, initial encounter: Secondary | ICD-10-CM
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
## 2018-12-05 NOTE — ED Triage Notes (Signed)
Patient evaluated by Madelynn Done, pa prior to this nurse

## 2018-12-05 NOTE — Discharge Instructions (Signed)
Dermabond should come off on its own over time; Do not scrub over this area Keep clean and dry Use anti-inflammatories for pain/swelling. You may take up to 600 mg Ibuprofen every 8 hours with food. You may supplement Ibuprofen with Tylenol [phone] mg every 8 hours.   Follow up if developing signs of infection- increased redness, swelling, or drainage

## 2019-05-10 ENCOUNTER — Other Ambulatory Visit: Payer: Self-pay | Admitting: Critical Care Medicine

## 2019-05-10 DIAGNOSIS — Z20822 Contact with and (suspected) exposure to covid-19: Secondary | ICD-10-CM

## 2019-05-11 LAB — NOVEL CORONAVIRUS, NAA: SARS-CoV-2, NAA: NOT DETECTED

## 2019-05-31 ENCOUNTER — Other Ambulatory Visit: Payer: Self-pay

## 2019-05-31 DIAGNOSIS — Z20822 Contact with and (suspected) exposure to covid-19: Secondary | ICD-10-CM

## 2019-06-02 LAB — NOVEL CORONAVIRUS, NAA: SARS-CoV-2, NAA: NOT DETECTED

## 2020-03-29 ENCOUNTER — Emergency Department (HOSPITAL_COMMUNITY)
Admission: EM | Admit: 2020-03-29 | Discharge: 2020-03-29 | Disposition: A | Discharge status: Home / Self Care | Payer: Medicaid Other | Attending: Emergency Medicine | Admitting: Emergency Medicine

## 2020-03-29 ENCOUNTER — Encounter (HOSPITAL_COMMUNITY): Payer: Self-pay | Admitting: Emergency Medicine

## 2020-03-29 ENCOUNTER — Other Ambulatory Visit: Payer: Self-pay

## 2020-03-29 DIAGNOSIS — M791 Myalgia, unspecified site: Secondary | ICD-10-CM

## 2020-03-29 DIAGNOSIS — M1990 Unspecified osteoarthritis, unspecified site: Secondary | ICD-10-CM | POA: Insufficient documentation

## 2020-03-29 DIAGNOSIS — J45909 Unspecified asthma, uncomplicated: Secondary | ICD-10-CM | POA: Insufficient documentation

## 2020-03-29 DIAGNOSIS — F17210 Nicotine dependence, cigarettes, uncomplicated: Secondary | ICD-10-CM | POA: Insufficient documentation

## 2020-03-29 DIAGNOSIS — M7918 Myalgia, other site: Secondary | ICD-10-CM | POA: Insufficient documentation

## 2020-03-29 LAB — BASIC METABOLIC PANEL
Anion gap: 10 (ref 5–15)
BUN: 8 mg/dL (ref 6–20)
CO2: 23 mmol/L (ref 22–32)
Calcium: 9 mg/dL (ref 8.9–10.3)
Chloride: 107 mmol/L (ref 98–111)
Creatinine, Ser: 0.87 mg/dL (ref 0.61–1.24)
GFR, Estimated: >60 mL/min (ref >60)
Glucose, Bld: 91 mg/dL (ref 70–99)
Potassium: 3.8 mmol/L (ref 3.5–5.1)
Sodium: 140 mmol/L (ref 135–145)

## 2020-03-29 LAB — CBC WITH DIFFERENTIAL/PLATELET
Abs Immature Granulocytes: 0.02 10*3/uL (ref 0.00–0.07)
Basophils Absolute: 0 10*3/uL (ref 0.0–0.1)
Basophils Relative: 0 %
Eosinophils Absolute: 0.2 10*3/uL (ref 0.0–0.5)
Eosinophils Relative: 2 %
HCT: 38.3 % — ABNORMAL LOW (ref 39.0–52.0)
Hemoglobin: 13.3 g/dL (ref 13.0–17.0)
Immature Granulocytes: 0 %
Lymphocytes Relative: 29 %
Lymphs Abs: 2.8 10*3/uL (ref 0.7–4.0)
MCH: 33.9 pg (ref 26.0–34.0)
MCHC: 34.7 g/dL (ref 30.0–36.0)
MCV: 97.7 fL (ref 80.0–100.0)
Monocytes Absolute: 1 10*3/uL (ref 0.1–1.0)
Monocytes Relative: 10 %
Neutro Abs: 5.7 10*3/uL (ref 1.7–7.7)
Neutrophils Relative %: 59 %
Platelets: 311 10*3/uL (ref 150–400)
RBC: 3.92 MIL/uL — ABNORMAL LOW (ref 4.22–5.81)
RDW: 14.1 % (ref 11.5–15.5)
WBC: 9.8 10*3/uL (ref 4.0–10.5)
nRBC: 0 % (ref 0.0–0.2)

## 2020-03-29 LAB — CK: Total CK: 169 U/L (ref 49–397)

## 2020-03-29 MED ORDER — NAPROXEN 500 MG PO TABS: 500.0000 mg | ORAL_TABLET | Freq: Two times a day (BID) | ORAL | 0 refills | Status: AC

## 2020-03-29 MED ORDER — KETOROLAC TROMETHAMINE 30 MG/ML IJ SOLN: 30.0000 mg | Freq: Once | INTRAMUSCULAR | Status: DC

## 2020-03-29 MED ORDER — KETOROLAC TROMETHAMINE 60 MG/2ML IM SOLN
30.0000 mg | Freq: Once | INTRAMUSCULAR | Status: AC
  Administered 2020-03-29: 30 mg via INTRAMUSCULAR
  Filled 2020-03-29: qty 2

## 2020-03-29 NOTE — ED Triage Notes (Signed)
Pt to triage via [facility].  Reports flare-up of arthritis pain.  C/o pain to bilateral legs and back x 2 days.

## 2020-03-29 NOTE — Discharge Instructions (Addendum)
Call your primary care doctor or specialist as discussed in the next 2-3 days.   Return immediately back to the ER if:  Your symptoms worsen within the next 12-24 hours. You develop new symptoms such as new fevers, persistent vomiting, new pain, shortness of breath, or new weakness or numbness, or if you have any other concerns.  

## 2020-04-03 NOTE — ED Provider Notes (Signed)
[facility] EMERGENCY DEPARTMENT Provider Note   CSN: [id number] Arrival date & time: 03/29/20  1449     History Chief Complaint  Patient presents with  . Leg Pain  . Back Pain    Frank [name] is a 49 y.o. male.  Patient presents with chief complaint of joint pain extremity pain.  He describes it as his "arthritis pain."  He describes it as an achiness in his bilateral arms bilateral legs.  He typically gets it once every couple of months.  He feels like he is having a flareup now.  Scribes he states he was diagnosed with arthritis, type unknown by his primary care doctor several years ago and has been having these flareups every once in a while.  Has been taking Tylenol at home without improvement so presents to the ER.  Denies headache or chest pain.  No fever no cough no vomiting no diarrhea.        Past Medical History:  Diagnosis Date  . Arthritis   . Asthma   . Gout   . Rib fracture     There are no problems to display for this patient.   History reviewed. No pertinent surgical history.     No family history on file.  Social History   Tobacco Use  . Smoking status: Current Every Day Smoker    Packs/day: 0.20    Types: Cigarettes  Substance Use Topics  . Alcohol use: Yes    Alcohol/week: 1.0 standard drink    Types: 1 Cans of beer per week    Comment: daily  . Drug use: Yes    Types: Marijuana, Cocaine    Comment: "crack sprinkled on top of tobacco"     Home Medications Prior to Admission medications   Medication Sig Start Date End Date Taking? Authorizing Provider  albuterol (PROVENTIL HFA;VENTOLIN HFA) 108 (90 BASE) MCG/ACT inhaler Inhale 1-2 puffs into the lungs every 4 (four) hours as needed for wheezing or shortness of breath. 02/05/13   Marisa Severin, MD    Allergies    Other  Review of Systems   Review of Systems  Constitutional: Negative for fever.  HENT: Negative for ear pain and sore throat.   Eyes: Negative for  pain.  Respiratory: Negative for cough.   Cardiovascular: Negative for chest pain.  Gastrointestinal: Negative for abdominal pain.  Genitourinary: Negative for flank pain.  Musculoskeletal: Negative for back pain.  Skin: Negative for color change and rash.  Neurological: Negative for syncope.  All other systems reviewed and are negative.   Physical Exam Updated Vital Signs BP 131/75 (BP Location: Right Arm)   Pulse 80   Temp 98.5 F (36.9 C) (Oral)   Resp 16   Ht 5\' 8"  (1.727 m)   Wt 63.5 kg   SpO2 98%   BMI 21.29 kg/m   Physical Exam Constitutional:      General: He is not in acute distress.    Appearance: He is well-developed.  HENT:     Head: Normocephalic.     Mouth/Throat:     Mouth: Mucous membranes are moist.  Cardiovascular:     Rate and Rhythm: Normal rate.  Pulmonary:     Effort: Pulmonary effort is normal.  Abdominal:     Palpations: Abdomen is soft.  Musculoskeletal:     Right lower leg: No edema.     Left lower leg: No edema.     Comments: Normal range of motion.  No gross  deformity noted.  No joint swelling or tenderness or erythema noted.  Normal range of motion of all bilateral upper extremities and bilateral lower extremities.  No tenderness palpation of extremities.  Compartments otherwise soft, neurovascular intact bilateral upper and lower extremities.  Skin:    General: Skin is warm.     Capillary Refill: Capillary refill takes less than 2 seconds.  Neurological:     General: No focal deficit present.     Mental Status: He is alert.     ED Results / Procedures / Treatments   Labs (all labs ordered are listed, but only abnormal results are displayed) Labs Reviewed  CBC WITH DIFFERENTIAL/PLATELET - Abnormal; Notable for the following components:      Result Value   RBC 3.92 (*)    HCT 38.3 (*)    All other components within normal limits  BASIC METABOLIC PANEL  CK    EKG None  Radiology No results found.  Procedures Procedures  (including critical care time)  Medications Ordered in ED Medications  ketorolac (TORADOL) injection 30 mg (30 mg Intramuscular Given 03/29/20 1737)    ED Course  I have reviewed the triage vital signs and the nursing notes.  Pertinent labs & imaging results that were available during my care of the patient were reviewed by me and considered in my medical decision making (see chart for details).    MDM Rules/Calculators/A&P                          Evaluation is relatively benign.  Patient complains of generalized malaise and aches which she describes to arthritis.  Concern for possible rhabdomyolysis or other cause.  Labs however unremarkable.  Patient improved with Toradol, declines further evaluation prefers to go home at this time.  Discharged home in stable condition.  Advised immediate return if she has fevers worsening symptoms worsening pain or any additional concerns, advised follow-up with his doctor in 3 to 4 days.   Final Clinical Impression(s) / ED Diagnoses Final diagnoses:  Myalgia    Rx / DC Orders ED Discharge Orders         Ordered    naproxen (NAPROSYN) 500 MG tablet  2 times daily        03/29/20 1803           Cheryll Cockayne, MD 04/03/20 [phone]

## 2020-07-11 ENCOUNTER — Other Ambulatory Visit: Payer: Self-pay

## 2020-07-11 ENCOUNTER — Emergency Department (HOSPITAL_COMMUNITY): Payer: Medicaid Other

## 2020-07-11 ENCOUNTER — Emergency Department (HOSPITAL_COMMUNITY)
Admission: EM | Admit: 2020-07-11 | Discharge: 2020-07-11 | Disposition: A | Discharge status: Home / Self Care | Payer: Medicaid Other | Attending: Emergency Medicine | Admitting: Emergency Medicine

## 2020-07-11 ENCOUNTER — Encounter (HOSPITAL_COMMUNITY): Payer: Self-pay | Admitting: Emergency Medicine

## 2020-07-11 DIAGNOSIS — J40 Bronchitis, not specified as acute or chronic: Secondary | ICD-10-CM | POA: Insufficient documentation

## 2020-07-11 DIAGNOSIS — J4541 Moderate persistent asthma with (acute) exacerbation: Secondary | ICD-10-CM | POA: Insufficient documentation

## 2020-07-11 DIAGNOSIS — F17210 Nicotine dependence, cigarettes, uncomplicated: Secondary | ICD-10-CM | POA: Insufficient documentation

## 2020-07-11 DIAGNOSIS — Z720 Tobacco use: Secondary | ICD-10-CM

## 2020-07-11 LAB — BASIC METABOLIC PANEL
Anion gap: 13 (ref 5–15)
BUN: 11 mg/dL (ref 6–20)
CO2: 21 mmol/L — ABNORMAL LOW (ref 22–32)
Calcium: 9.1 mg/dL (ref 8.9–10.3)
Chloride: 104 mmol/L (ref 98–111)
Creatinine, Ser: 0.86 mg/dL (ref 0.61–1.24)
GFR, Estimated: >60 mL/min (ref >60)
Glucose, Bld: 77 mg/dL (ref 70–99)
Potassium: 4 mmol/L (ref 3.5–5.1)
Sodium: 138 mmol/L (ref 135–145)

## 2020-07-11 LAB — CBC
HCT: 41.3 % (ref 39.0–52.0)
Hemoglobin: 14 g/dL (ref 13.0–17.0)
MCH: 33.5 pg (ref 26.0–34.0)
MCHC: 33.9 g/dL (ref 30.0–36.0)
MCV: 98.8 fL (ref 80.0–100.0)
Platelets: 319 10*3/uL (ref 150–400)
RBC: 4.18 MIL/uL — ABNORMAL LOW (ref 4.22–5.81)
RDW: 13.7 % (ref 11.5–15.5)
WBC: 11.9 10*3/uL — ABNORMAL HIGH (ref 4.0–10.5)
nRBC: 0 % (ref 0.0–0.2)

## 2020-07-11 LAB — TROPONIN I (HIGH SENSITIVITY)
Troponin I (High Sensitivity): 7 ng/L (ref <18)
Troponin I (High Sensitivity): 5 ng/L (ref <18)

## 2020-07-11 IMAGING — CR DG CHEST 2V
2 series · 2 of 2 positions shown · non-contrast
Comparison: [DATE]

CLINICAL DATA: Shortness of breath

EXAM:
CHEST - 2 VIEW

[chest pa]
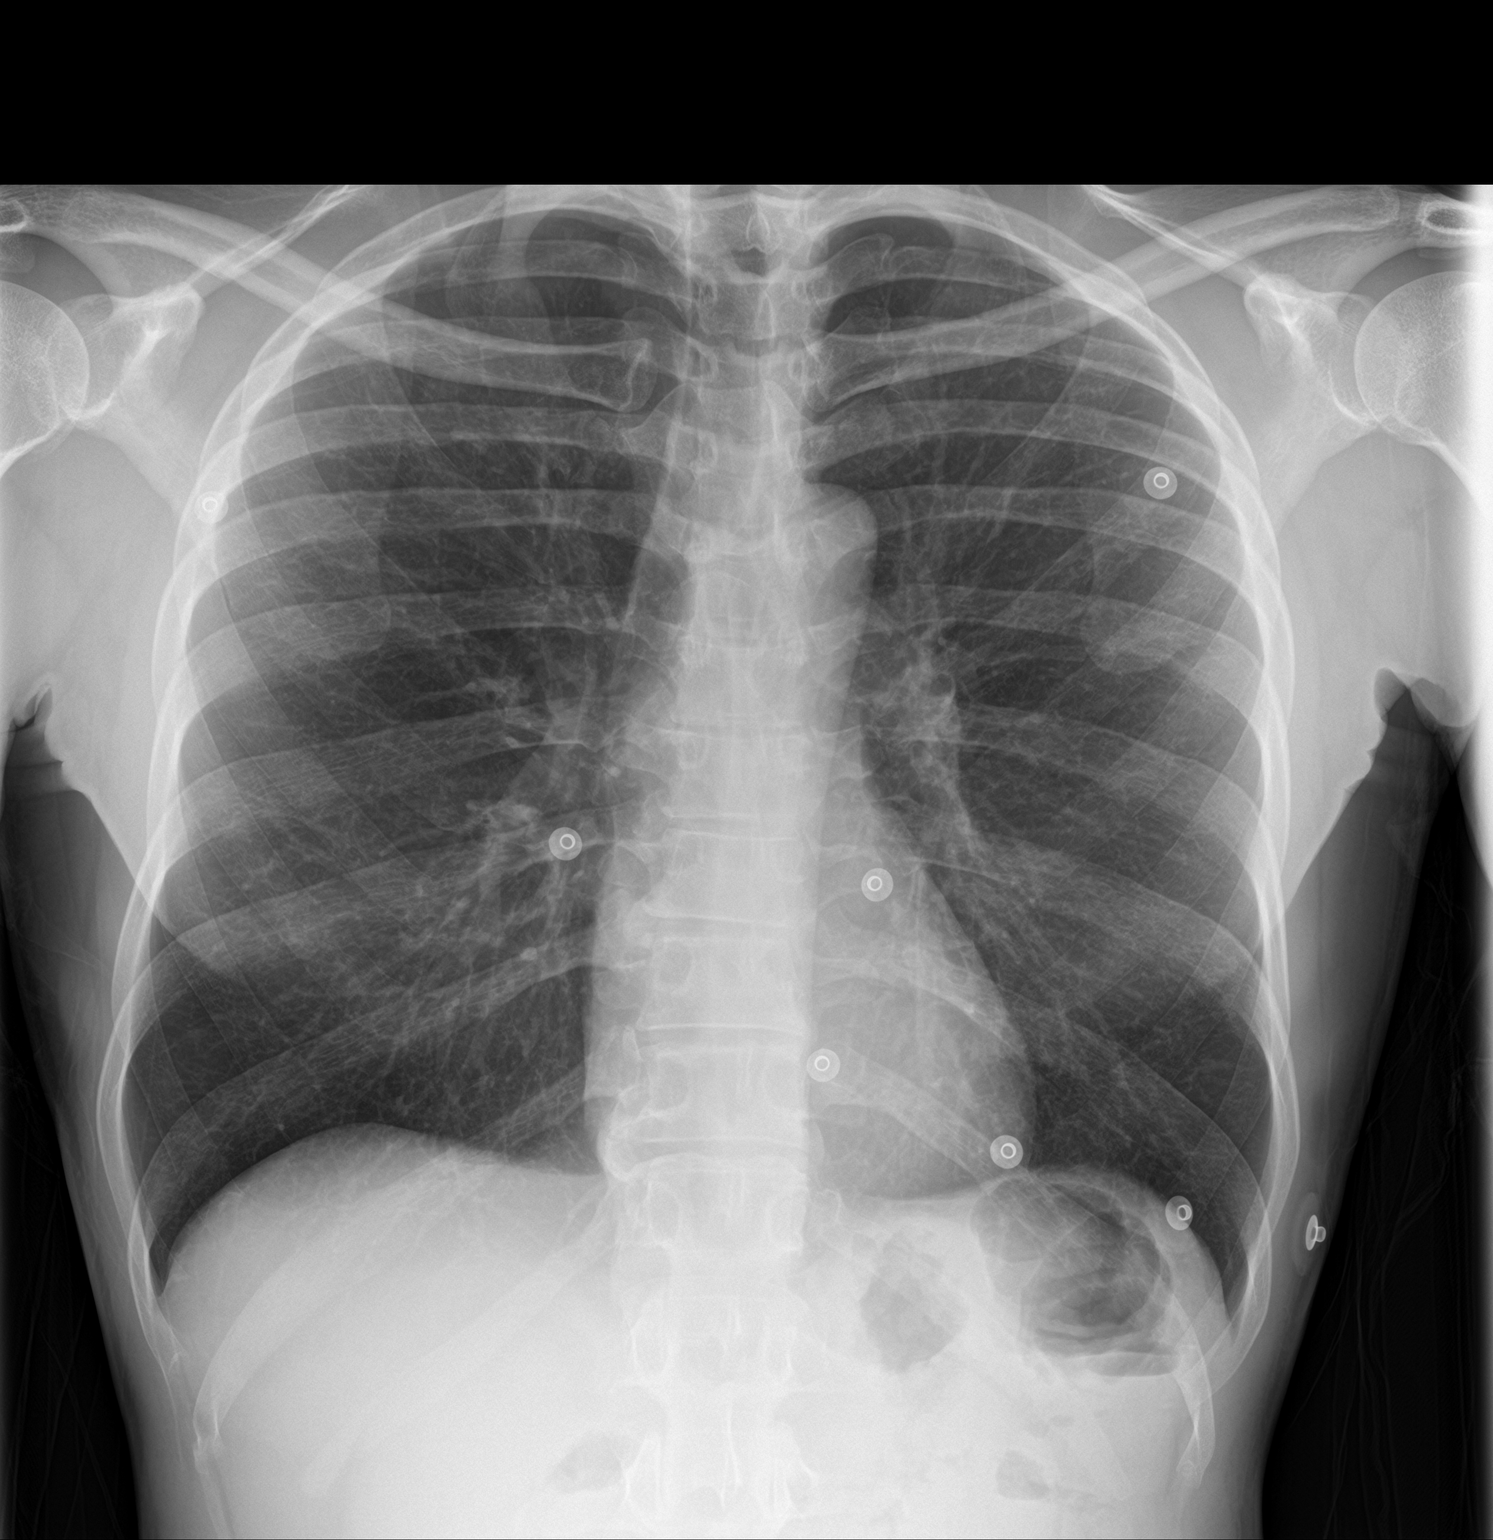

[chest lat]
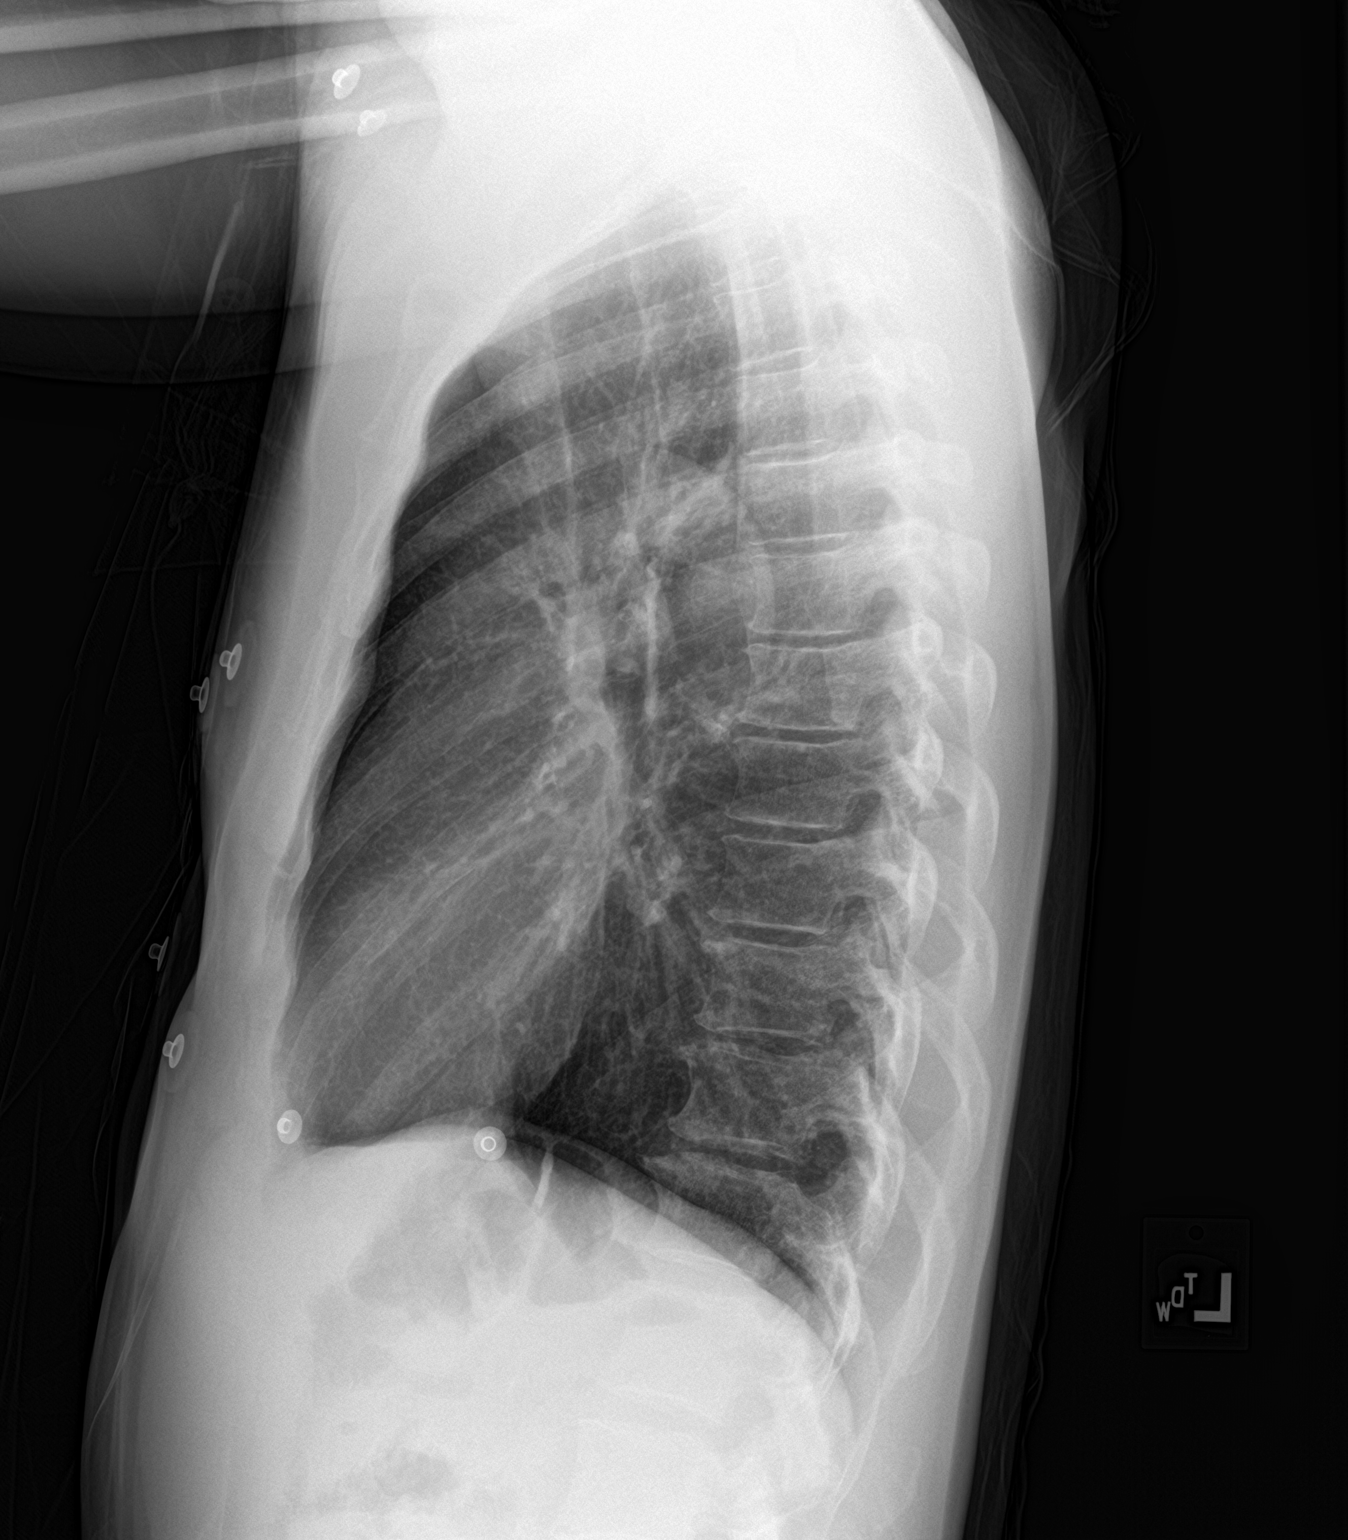

[2 of 2 positions shown; findings below may reference images not displayed]

FINDINGS: The heart size and mediastinal contours are within normal limits.
Both lungs are clear. The visualized skeletal structures are stable.
IMPRESSION: No active cardiopulmonary disease.

## 2020-07-11 MED ORDER — ALBUTEROL SULFATE (2.5 MG/3ML) 0.083% IN NEBU: 2.5000 mg | INHALATION_SOLUTION | RESPIRATORY_TRACT | 3 refills | Status: DC | PRN

## 2020-07-11 MED ORDER — PREDNISONE 20 MG PO TABS: ORAL_TABLET | ORAL | 0 refills | Status: DC

## 2020-07-11 MED ORDER — AZITHROMYCIN 250 MG PO TABS: 250.0000 mg | ORAL_TABLET | Freq: Every day | ORAL | 0 refills | Status: DC

## 2020-07-11 MED ORDER — ALBUTEROL SULFATE HFA 108 (90 BASE) MCG/ACT IN AERS: 2.0000 | INHALATION_SPRAY | RESPIRATORY_TRACT | 2 refills | Status: DC | PRN

## 2020-07-11 NOTE — ED Provider Notes (Signed)
[facility] EMERGENCY DEPARTMENT Provider Note   CSN: [id number] Arrival date & time: 07/11/20  1034     History Chief Complaint  Patient presents with  . Shortness of Breath    Frank [name] is a 50 y.o. male.  HPI Patient reports he has been asthmatic since childhood.  He reports he used inhalers periodically as needed.  As an adult, he reports he does use a rescue inhaler pretty frequently.  His girlfriend has a nebulizer machine which he has been using nightly now for several months.  He reports he is run out of the nebulizer solution as well as his handheld inhalers.  He reports that he has had cough that is productive of a thick sputum.  He reports that is becoming a pretty frequent thing particularly at nighttime his chest just seems to get congested.  He reports for a while he has had to wake up sometimes and use the inhaler about 4 times and then has relief and can resume usual activity.  He reports when he gets an attack he has central chest tightness is worse with breathing in and out.  No active chest pain now.  He has not had a fever.  No lower extremity swelling or calf pain.  No vomiting or diarrhea.  Patient reports he does continue to smoke 1/2 pack of cigarettes per day.  Reports he smokes marijuana periodically as well, several times a week.  He reports occasional inhaled use of cocaine.  He reports he is really stopped that now due to concerns of it being laced with fentanyl.  He denies any IV drug abuse.    Past Medical History:  Diagnosis Date  . Arthritis   . Asthma   . Gout   . Rib fracture     There are no problems to display for this patient.   History reviewed. No pertinent surgical history.     No family history on file.  Social History   Tobacco Use  . Smoking status: Current Every Day Smoker    Packs/day: 0.20    Types: Cigarettes  Substance Use Topics  . Alcohol use: Yes    Alcohol/week: 1.0 standard drink    Types: 1  Cans of beer per week    Comment: daily  . Drug use: Yes    Types: Marijuana, Cocaine    Comment: "crack sprinkled on top of tobacco"     Home Medications Prior to Admission medications   Medication Sig Start Date End Date Taking? Authorizing Provider  albuterol (PROVENTIL) (2.5 MG/3ML) 0.083% nebulizer solution Take 3 mLs (2.5 mg total) by nebulization every 4 (four) hours as needed for wheezing or shortness of breath. 07/11/20  Yes Arby Barrette, MD  albuterol (VENTOLIN HFA) 108 (90 Base) MCG/ACT inhaler Inhale 2 puffs into the lungs every 4 (four) hours as needed for wheezing or shortness of breath. 07/11/20  Yes Yobani Schertzer, Lebron Conners, MD  azithromycin (ZITHROMAX Z-PAK) 250 MG tablet Take 1 tablet (250 mg total) by mouth daily. 2 tablets on the first day.  1 tablet daily for the next 4 days 07/11/20  Yes Craige Patel, Lebron Conners, MD  predniSONE (DELTASONE) 20 MG tablet 3 tabs po day one, then 2 po daily x 4 days 07/11/20  Yes Daveion Robar, Lebron Conners, MD  albuterol (PROVENTIL HFA;VENTOLIN HFA) 108 (90 BASE) MCG/ACT inhaler Inhale 1-2 puffs into the lungs every 4 (four) hours as needed for wheezing or shortness of breath. 02/05/13   Marisa Severin, MD  Allergies    Other  Review of Systems   Review of Systems 10 systems reviewed and negative except as per HPI Physical Exam Updated Vital Signs BP (!) 139/92   Pulse 72   Temp 98.5 F (36.9 C)   Resp 17   SpO2 94%   Physical Exam Constitutional:      Appearance: He is well-developed.     Comments: Well in appearance.  No respiratory distress.  Speaking in clear full sentences.  HENT:     Head: Normocephalic and atraumatic.     Mouth/Throat:     Pharynx: Oropharynx is clear.  Eyes:     Extraocular Movements: Extraocular movements intact.     Pupils: Pupils are equal, round, and reactive to light.  Cardiovascular:     Rate and Rhythm: Normal rate and regular rhythm.     Heart sounds: Normal heart sounds.  Pulmonary:     Effort: Pulmonary effort is  normal.     Comments: No respiratory distress.  Patient has good airflow to the bases.  Rare expiratory wheeze, cleared with coughing. Abdominal:     General: Bowel sounds are normal. There is no distension.     Palpations: Abdomen is soft.     Tenderness: There is no abdominal tenderness.  Musculoskeletal:        General: No swelling or tenderness. Normal range of motion.     Cervical back: Neck supple.     Right lower leg: No edema.     Left lower leg: No edema.  Skin:    General: Skin is warm and dry.  Neurological:     Mental Status: He is alert and oriented to person, place, and time.     GCS: GCS eye subscore is 4. GCS verbal subscore is 5. GCS motor subscore is 6.     Coordination: Coordination normal.  Psychiatric:        Mood and Affect: Mood normal.     ED Results / Procedures / Treatments   Labs (all labs ordered are listed, but only abnormal results are displayed) Labs Reviewed  BASIC METABOLIC PANEL - Abnormal; Notable for the following components:      Result Value   CO2 21 (*)    All other components within normal limits  CBC - Abnormal; Notable for the following components:   WBC 11.9 (*)    RBC 4.18 (*)    All other components within normal limits  TROPONIN I (HIGH SENSITIVITY)  TROPONIN I (HIGH SENSITIVITY)    EKG EKG Interpretation  Date/Time:  Saturday July 11 2020 10:53:40 EDT Ventricular Rate:  76 PR Interval:  146 QRS Duration: 72 QT Interval:  370 QTC Calculation: 416 R Axis:   76 Text Interpretation: Normal sinus rhythm Normal ECG early repolarization seen on older tracings, otherwise normal. Confirmed by Arby Barrette [phone]) on 07/11/2020 1:01:36 PM   Radiology DG Chest 2 View  Result Date: 07/11/2020 CLINICAL DATA:  Shortness of breath EXAM: CHEST - 2 VIEW COMPARISON:  January 08, 2018 FINDINGS: The heart size and mediastinal contours are within normal limits. Both lungs are clear. The visualized skeletal structures are stable.  IMPRESSION: No active cardiopulmonary disease. Electronically Signed   By: Sherian Rein M.D.   On: 07/11/2020 11:43    Procedures Procedures   Medications Ordered in ED Medications - No data to display  ED Course  I have reviewed the triage vital signs and the nursing notes.  Pertinent labs & imaging results that were  available during my care of the patient were reviewed by me and considered in my medical decision making (see chart for details).    MDM Rules/Calculators/A&P                          Patient describes chronic asthma.  This is been poorly managed with only rescue inhalers on as-needed basis which sounds to be frequent.  Patient also is a daily smoker.  History suggests a component of COPD as well.  Patient was treated by EMS with albuterol, Solu-Medrol and magnesium.  The time of my evaluation, patient is much improved.  He has good airflow and is speaking in full sentences without difficulty.  Chest x-ray is clear.  Patient is counseled on management of asthma with significant importance of smoking cessation.  At this time, with report of persistently productive cough and wheezing with shortness of breath, will continue a course of prednisone, add Z-Pak and albuterol as needed.  Patient is made aware that he must have PCP follow-up and start management for chronic and routine asthma, as well as the possibility of developing COPD.  Voices understanding Final Clinical Impression(s) / ED Diagnoses Final diagnoses:  Moderate persistent asthma with exacerbation  Bronchitis  Tobacco abuse    Rx / DC Orders ED Discharge Orders         Ordered    azithromycin (ZITHROMAX Z-PAK) 250 MG tablet  Daily        07/11/20 1323    predniSONE (DELTASONE) 20 MG tablet        07/11/20 1323    albuterol (VENTOLIN HFA) 108 (90 Base) MCG/ACT inhaler  Every 4 hours PRN        07/11/20 1323    albuterol (PROVENTIL) (2.5 MG/3ML) 0.083% nebulizer solution  Every 4 hours PRN        07/11/20  1323           Arby Barrette, MD 07/11/20 1327

## 2020-07-11 NOTE — Discharge Instructions (Signed)
1.  Fill your prescriptions.  Take your first dose of Zithromax today.  Take 2 tablets today then 1 tablet daily for the next 4 days.  You may also start your prednisone at dinnertime. 2.  Make an appointment to be seen by family doctor.  Is very important that you get regular management of your asthma. 3.  You must stop smoking. 4.  Return to the emergency department if you develop shortness of breath, worsening chest pain, other concerning symptoms.

## 2020-07-11 NOTE — ED Triage Notes (Signed)
Pt to triage via [facility] from home.  Reports SOB and wheezing that happens every night for the past year.  Out of nebulizer meds and inhalers for "awhile".  EMS administered Albuterol neb, Duo Neb, Solumedrol 125 mg, and Mag 2 grams.  18g LFA.    Reports tightness across chest.

## 2020-07-11 NOTE — ED Notes (Signed)
Repeat troponin sent

## 2020-09-02 ENCOUNTER — Encounter (HOSPITAL_COMMUNITY): Payer: Self-pay

## 2020-09-02 ENCOUNTER — Emergency Department (HOSPITAL_COMMUNITY)
Admission: EM | Admit: 2020-09-02 | Discharge: 2020-09-03 | Disposition: A | Discharge status: Home / Self Care | Payer: Self-pay | Attending: Emergency Medicine | Admitting: Emergency Medicine

## 2020-09-02 ENCOUNTER — Emergency Department (HOSPITAL_COMMUNITY): Payer: Self-pay

## 2020-09-02 DIAGNOSIS — S01411A Laceration without foreign body of right cheek and temporomandibular area, initial encounter: Secondary | ICD-10-CM | POA: Insufficient documentation

## 2020-09-02 DIAGNOSIS — S02671A Fracture of alveolus of right mandible, initial encounter for closed fracture: Secondary | ICD-10-CM

## 2020-09-02 DIAGNOSIS — J45909 Unspecified asthma, uncomplicated: Secondary | ICD-10-CM | POA: Insufficient documentation

## 2020-09-02 DIAGNOSIS — S0181XA Laceration without foreign body of other part of head, initial encounter: Secondary | ICD-10-CM | POA: Insufficient documentation

## 2020-09-02 DIAGNOSIS — S02670A Fracture of alveolus of mandible, unspecified side, initial encounter for closed fracture: Secondary | ICD-10-CM | POA: Insufficient documentation

## 2020-09-02 DIAGNOSIS — F17210 Nicotine dependence, cigarettes, uncomplicated: Secondary | ICD-10-CM | POA: Insufficient documentation
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
## 2020-09-03 ENCOUNTER — Emergency Department (HOSPITAL_COMMUNITY): Payer: Self-pay
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
## 2020-09-03 MED ORDER — LIDOCAINE HCL (PF) 1 % IJ SOLN
30.0000 mL | Freq: Once | INTRAMUSCULAR | Status: AC
  Administered 2020-09-03: 30 mL
  Filled 2020-09-03: qty 30

## 2020-09-03 MED ORDER — NAPROXEN 500 MG PO TABS: 500.0000 mg | ORAL_TABLET | Freq: Two times a day (BID) | ORAL | 0 refills | Status: DC | PRN

## 2020-09-03 NOTE — ED Notes (Signed)
The pt had a portion of his facecleaned with peroxide and saline he is sleepling soundly
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
## 2021-01-26 ENCOUNTER — Inpatient Hospital Stay (HOSPITAL_COMMUNITY)
Admission: EM | Admit: 2021-01-26 | Discharge: 2021-02-06 | DRG: 682 | Disposition: A | Discharge status: Home / Self Care | Payer: Self-pay | Attending: Internal Medicine | Admitting: Internal Medicine

## 2021-01-26 ENCOUNTER — Encounter (HOSPITAL_COMMUNITY): Payer: Self-pay | Admitting: Internal Medicine

## 2021-01-26 ENCOUNTER — Emergency Department (HOSPITAL_COMMUNITY): Payer: Self-pay

## 2021-01-26 ENCOUNTER — Emergency Department (HOSPITAL_BASED_OUTPATIENT_CLINIC_OR_DEPARTMENT_OTHER): Payer: Self-pay

## 2021-01-26 ENCOUNTER — Observation Stay (HOSPITAL_COMMUNITY): Payer: Self-pay

## 2021-01-26 DIAGNOSIS — E875 Hyperkalemia: Secondary | ICD-10-CM | POA: Diagnosis not present

## 2021-01-26 DIAGNOSIS — S300XXA Contusion of lower back and pelvis, initial encounter: Secondary | ICD-10-CM | POA: Diagnosis present

## 2021-01-26 DIAGNOSIS — F17210 Nicotine dependence, cigarettes, uncomplicated: Secondary | ICD-10-CM | POA: Diagnosis present

## 2021-01-26 DIAGNOSIS — K661 Hemoperitoneum: Secondary | ICD-10-CM | POA: Diagnosis present

## 2021-01-26 DIAGNOSIS — S7001XA Contusion of right hip, initial encounter: Secondary | ICD-10-CM | POA: Diagnosis present

## 2021-01-26 DIAGNOSIS — M109 Gout, unspecified: Secondary | ICD-10-CM | POA: Diagnosis present

## 2021-01-26 DIAGNOSIS — F1010 Alcohol abuse, uncomplicated: Secondary | ICD-10-CM

## 2021-01-26 DIAGNOSIS — E871 Hypo-osmolality and hyponatremia: Secondary | ICD-10-CM | POA: Diagnosis not present

## 2021-01-26 DIAGNOSIS — R778 Other specified abnormalities of plasma proteins: Secondary | ICD-10-CM | POA: Diagnosis present

## 2021-01-26 DIAGNOSIS — R1011 Right upper quadrant pain: Secondary | ICD-10-CM

## 2021-01-26 DIAGNOSIS — I10 Essential (primary) hypertension: Secondary | ICD-10-CM | POA: Diagnosis present

## 2021-01-26 DIAGNOSIS — Z20822 Contact with and (suspected) exposure to covid-19: Secondary | ICD-10-CM | POA: Diagnosis present

## 2021-01-26 DIAGNOSIS — D649 Anemia, unspecified: Secondary | ICD-10-CM | POA: Diagnosis present

## 2021-01-26 DIAGNOSIS — R7989 Other specified abnormal findings of blood chemistry: Secondary | ICD-10-CM

## 2021-01-26 DIAGNOSIS — I959 Hypotension, unspecified: Secondary | ICD-10-CM | POA: Diagnosis present

## 2021-01-26 DIAGNOSIS — T796XXA Traumatic ischemia of muscle, initial encounter: Secondary | ICD-10-CM

## 2021-01-26 DIAGNOSIS — W010XXA Fall on same level from slipping, tripping and stumbling without subsequent striking against object, initial encounter: Secondary | ICD-10-CM | POA: Diagnosis present

## 2021-01-26 DIAGNOSIS — L03115 Cellulitis of right lower limb: Secondary | ICD-10-CM | POA: Diagnosis present

## 2021-01-26 DIAGNOSIS — M79604 Pain in right leg: Secondary | ICD-10-CM

## 2021-01-26 DIAGNOSIS — S7011XA Contusion of right thigh, initial encounter: Secondary | ICD-10-CM | POA: Diagnosis present

## 2021-01-26 DIAGNOSIS — F1910 Other psychoactive substance abuse, uncomplicated: Secondary | ICD-10-CM

## 2021-01-26 DIAGNOSIS — R197 Diarrhea, unspecified: Secondary | ICD-10-CM

## 2021-01-26 DIAGNOSIS — J4520 Mild intermittent asthma, uncomplicated: Secondary | ICD-10-CM | POA: Diagnosis present

## 2021-01-26 DIAGNOSIS — Z79899 Other long term (current) drug therapy: Secondary | ICD-10-CM

## 2021-01-26 DIAGNOSIS — N179 Acute kidney failure, unspecified: Principal | ICD-10-CM

## 2021-01-26 DIAGNOSIS — Z91018 Allergy to other foods: Secondary | ICD-10-CM

## 2021-01-26 DIAGNOSIS — N2889 Other specified disorders of kidney and ureter: Secondary | ICD-10-CM | POA: Diagnosis present

## 2021-01-26 DIAGNOSIS — E872 Acidosis, unspecified: Secondary | ICD-10-CM | POA: Diagnosis present

## 2021-01-26 DIAGNOSIS — F1410 Cocaine abuse, uncomplicated: Secondary | ICD-10-CM | POA: Diagnosis present

## 2021-01-26 LAB — RESP PANEL BY RT-PCR (FLU A&B, COVID) ARPGX2
Influenza A by PCR: NEGATIVE
Influenza B by PCR: NEGATIVE
SARS Coronavirus 2 by RT PCR: NEGATIVE

## 2021-01-26 LAB — CBC WITH DIFFERENTIAL/PLATELET
Abs Immature Granulocytes: 0.19 10*3/uL — ABNORMAL HIGH (ref 0.00–0.07)
Basophils Absolute: 0 10*3/uL (ref 0.0–0.1)
Basophils Relative: 0 %
Eosinophils Absolute: 0 10*3/uL (ref 0.0–0.5)
Eosinophils Relative: 0 %
HCT: 47 % (ref 39.0–52.0)
Hemoglobin: 16.2 g/dL (ref 13.0–17.0)
Immature Granulocytes: 1 %
Lymphocytes Relative: 2 %
Lymphs Abs: 0.5 10*3/uL — ABNORMAL LOW (ref 0.7–4.0)
MCH: 34.4 pg — ABNORMAL HIGH (ref 26.0–34.0)
MCHC: 34.5 g/dL (ref 30.0–36.0)
MCV: 99.8 fL (ref 80.0–100.0)
Monocytes Absolute: 1.8 10*3/uL — ABNORMAL HIGH (ref 0.1–1.0)
Monocytes Relative: 8 %
Neutro Abs: 19.5 10*3/uL — ABNORMAL HIGH (ref 1.7–7.7)
Neutrophils Relative %: 89 %
Platelets: 298 10*3/uL (ref 150–400)
RBC: 4.71 MIL/uL (ref 4.22–5.81)
RDW: 12.9 % (ref 11.5–15.5)
WBC: 22 10*3/uL — ABNORMAL HIGH (ref 4.0–10.5)
nRBC: 0 % (ref 0.0–0.2)

## 2021-01-26 LAB — TROPONIN I (HIGH SENSITIVITY)
Troponin I (High Sensitivity): 45 ng/L — ABNORMAL HIGH (ref <18)
Troponin I (High Sensitivity): 47 ng/L — ABNORMAL HIGH (ref <18)
Troponin I (High Sensitivity): 39 ng/L — ABNORMAL HIGH (ref <18)

## 2021-01-26 LAB — COMPREHENSIVE METABOLIC PANEL
ALT: 392 U/L — ABNORMAL HIGH (ref 0–44)
AST: 1140 U/L — ABNORMAL HIGH (ref 15–41)
Albumin: 4.4 g/dL (ref 3.5–5.0)
Alkaline Phosphatase: 64 U/L (ref 38–126)
Anion gap: 20 — ABNORMAL HIGH (ref 5–15)
BUN: 39 mg/dL — ABNORMAL HIGH (ref 6–20)
CO2: 20 mmol/L — ABNORMAL LOW (ref 22–32)
Calcium: 8 mg/dL — ABNORMAL LOW (ref 8.9–10.3)
Chloride: 96 mmol/L — ABNORMAL LOW (ref 98–111)
Creatinine, Ser: 3.86 mg/dL — ABNORMAL HIGH (ref 0.61–1.24)
GFR, Estimated: 18 mL/min — ABNORMAL LOW (ref >60)
Glucose, Bld: 136 mg/dL — ABNORMAL HIGH (ref 70–99)
Potassium: 4.9 mmol/L (ref 3.5–5.1)
Sodium: 136 mmol/L (ref 135–145)
Total Bilirubin: 0.8 mg/dL (ref 0.3–1.2)
Total Protein: 8 g/dL (ref 6.5–8.1)

## 2021-01-26 LAB — PROTIME-INR
INR: 1.1 (ref 0.8–1.2)
Prothrombin Time: 13.8 seconds (ref 11.4–15.2)

## 2021-01-26 LAB — LACTIC ACID, PLASMA
Lactic Acid, Venous: 2.3 mmol/L (ref 0.5–1.9)
Lactic Acid, Venous: 1.5 mmol/L (ref 0.5–1.9)

## 2021-01-26 LAB — TSH: TSH: 2.341 u[IU]/mL (ref 0.350–4.500)

## 2021-01-26 LAB — AMMONIA: Ammonia: 56 umol/L — ABNORMAL HIGH (ref 9–35)

## 2021-01-26 LAB — ETHANOL: Alcohol, Ethyl (B): <10 mg/dL (ref <10)

## 2021-01-26 IMAGING — US US ABDOMEN LIMITED
1 series · 14 of 25 positions shown · non-contrast
Comparison: CT chest abdomen and pelvis [DATE] report only.

CLINICAL DATA: Right upper quadrant pain.

EXAM:
ULTRASOUND ABDOMEN LIMITED RIGHT UPPER QUADRANT

[Series 1: us abdomen limited ruq (liver/gb) · 14 of 46 slices shown]
[im 1/46]
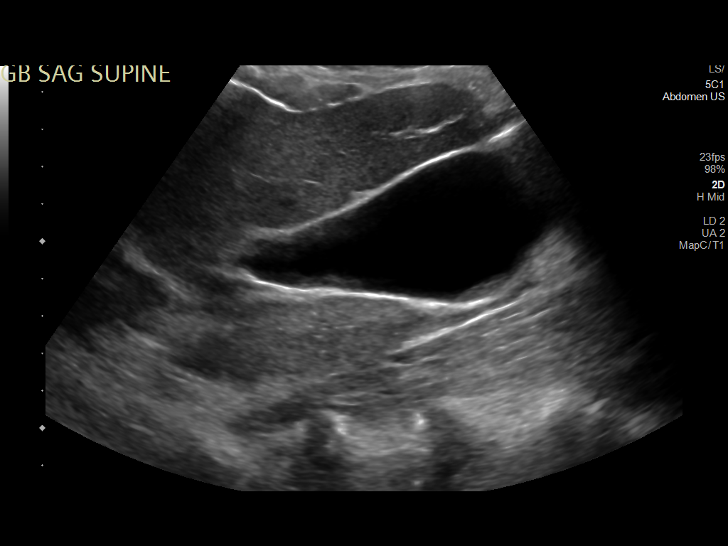
[im 4/46]
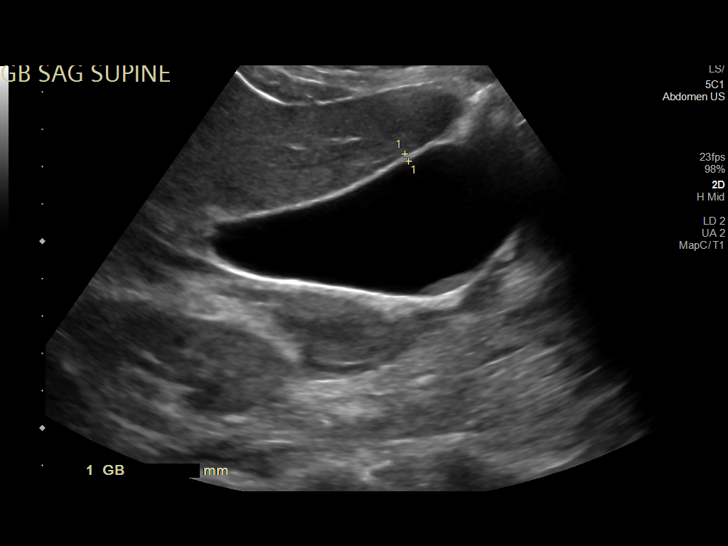
[im 8/46]
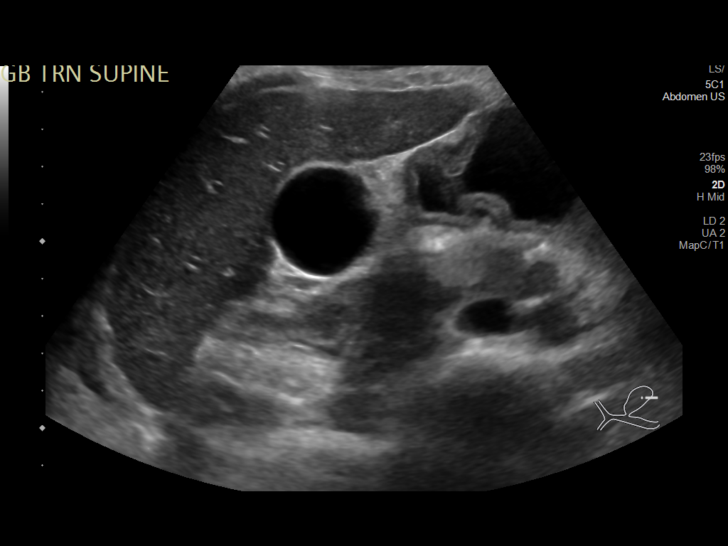
[im 12/46]
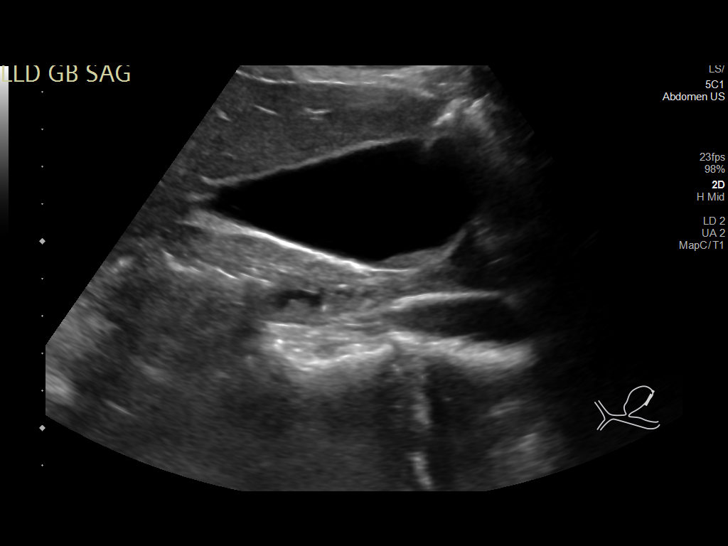
[im 16/46]
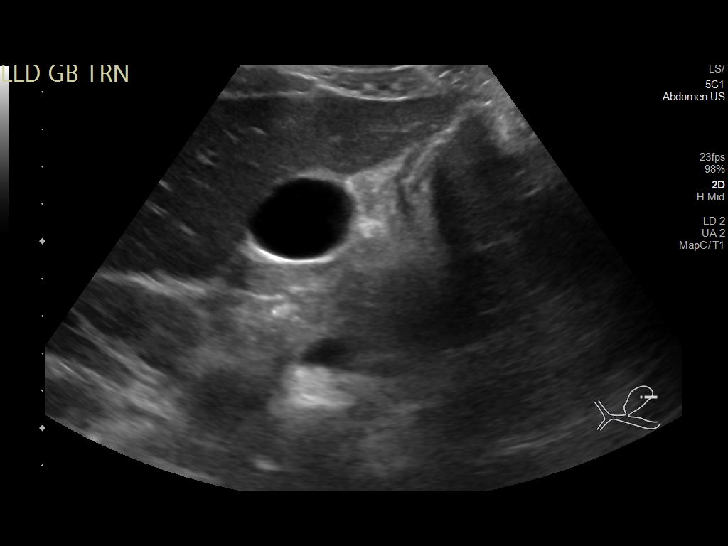
[im 17/46]
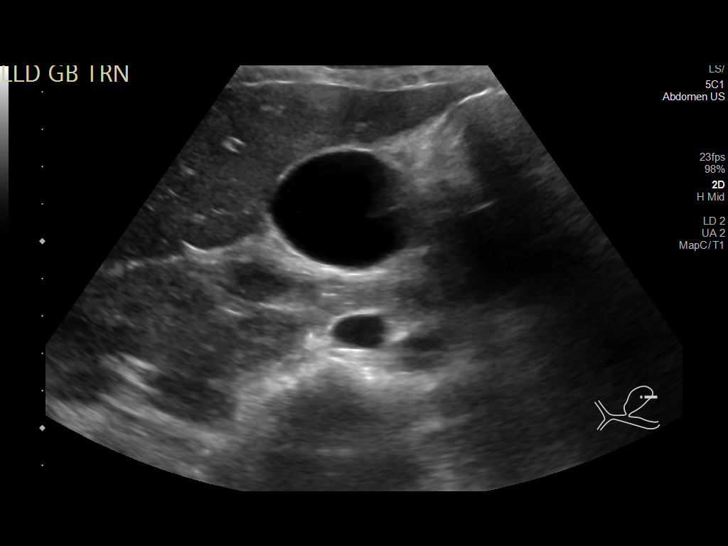
[im 21/46]
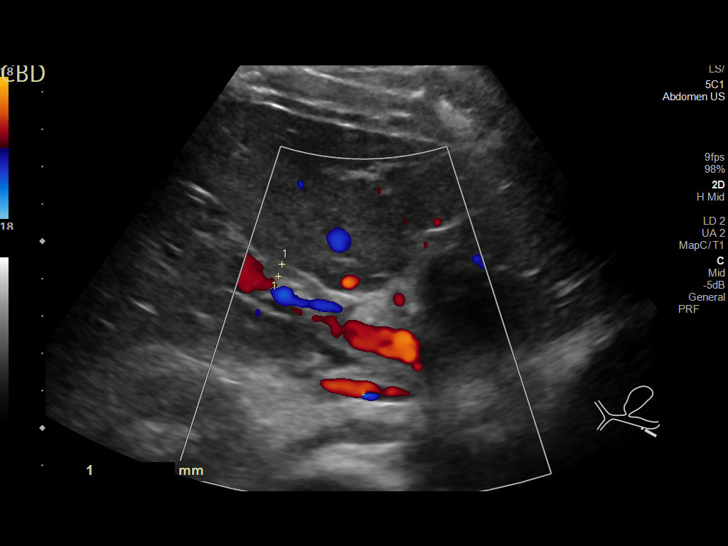
[im 25/46]
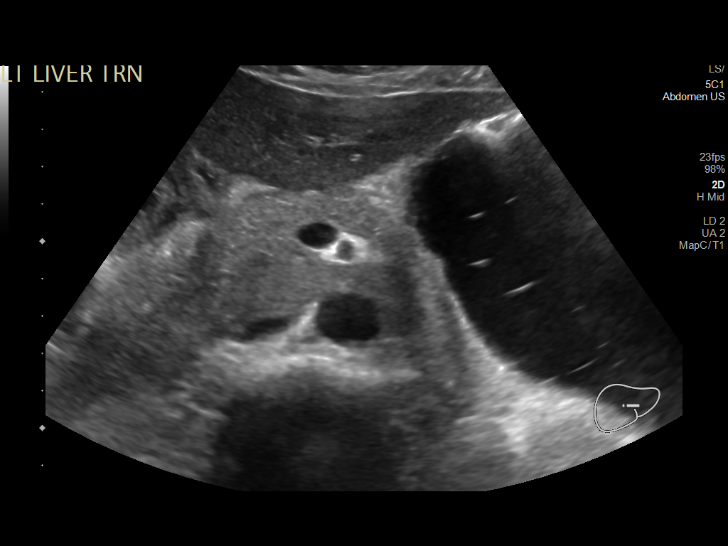
[im 29/46]
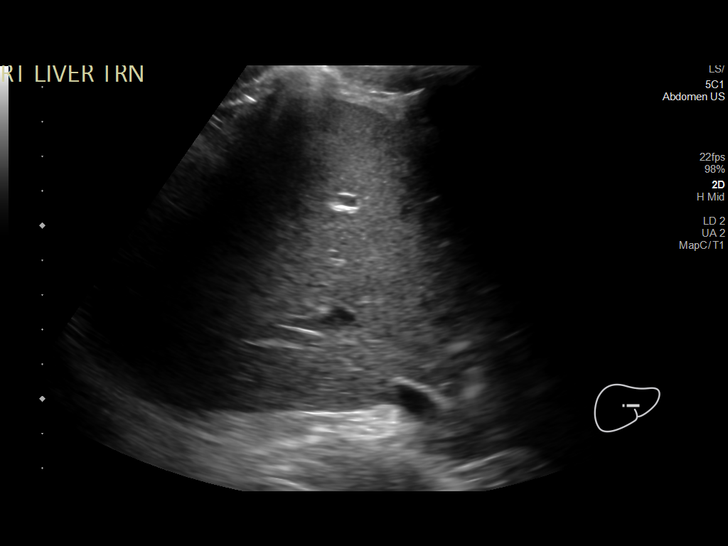
[im 31/46]
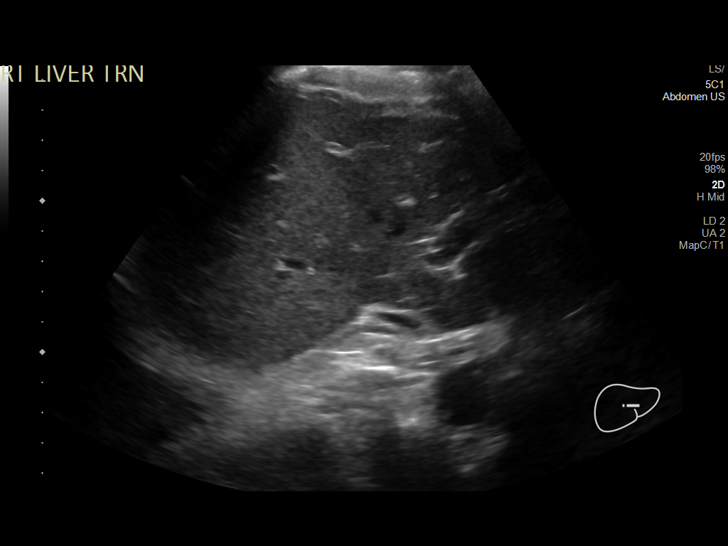
[im 34/46]
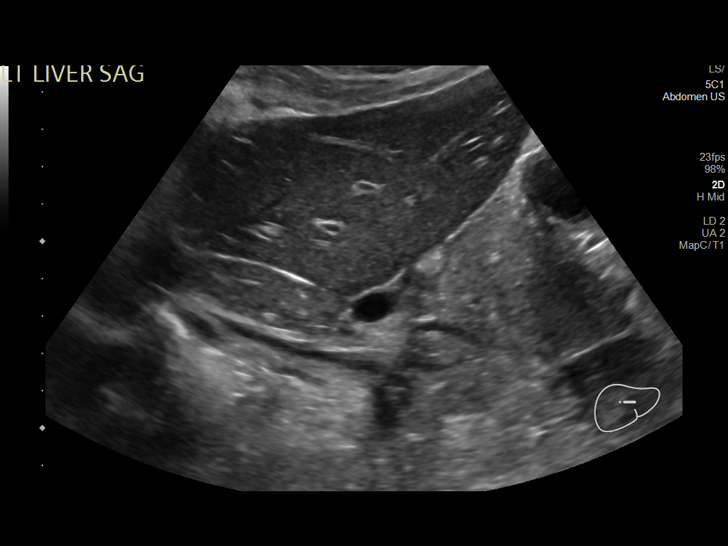
[im 38/46]
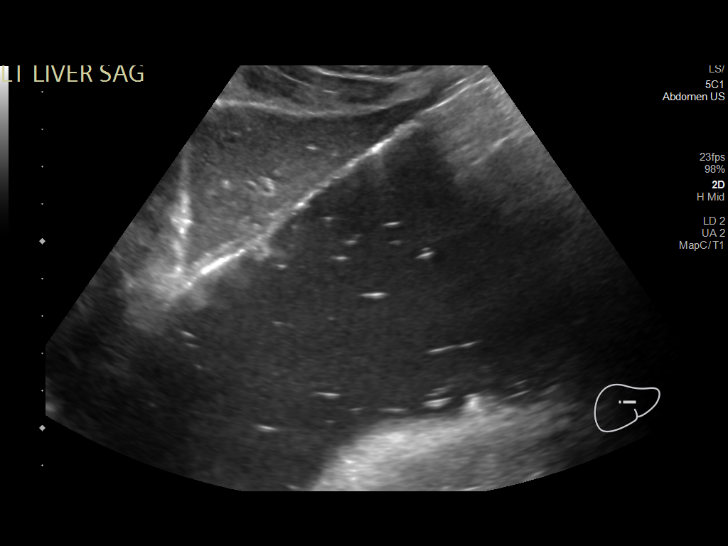
[im 42/46]
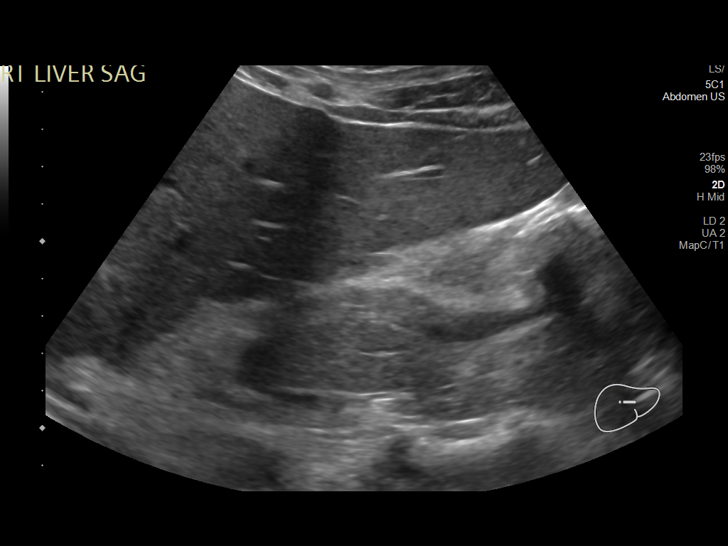
[im 46/46]
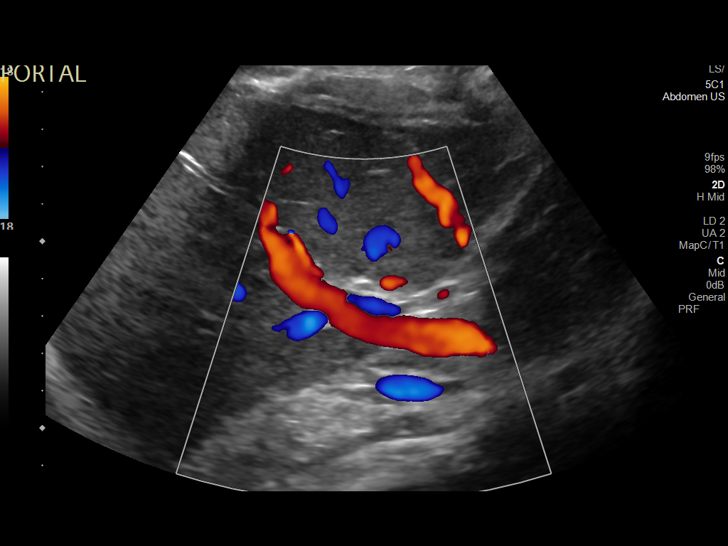

[14 of 25 positions shown; findings below may reference images not displayed]

FINDINGS: Gallbladder:

No gallstones or wall thickening visualized. Gallbladder sludge is
present. No sonographic Murphy sign noted by sonographer.

Common bile duct:

Diameter: 3.7 mm.

Liver:

No focal lesion identified. Within normal limits in parenchymal
echogenicity. Portal vein is patent on color Doppler imaging with
normal direction of blood flow towards the liver.

Other: None.
IMPRESSION: 1. Gallbladder sludge. No additional sonographic evidence for
cholelithiasis or acute cholecystitis.

## 2021-01-26 IMAGING — DX DG ANKLE COMPLETE 3+V*L*
3 series · 3 of 3 positions shown · non-contrast
Comparison: None.

CLINICAL DATA: Chills and cough.  Status post fall.

EXAM:
LEFT ANKLE COMPLETE - 3+ VIEW

[ankle ap]
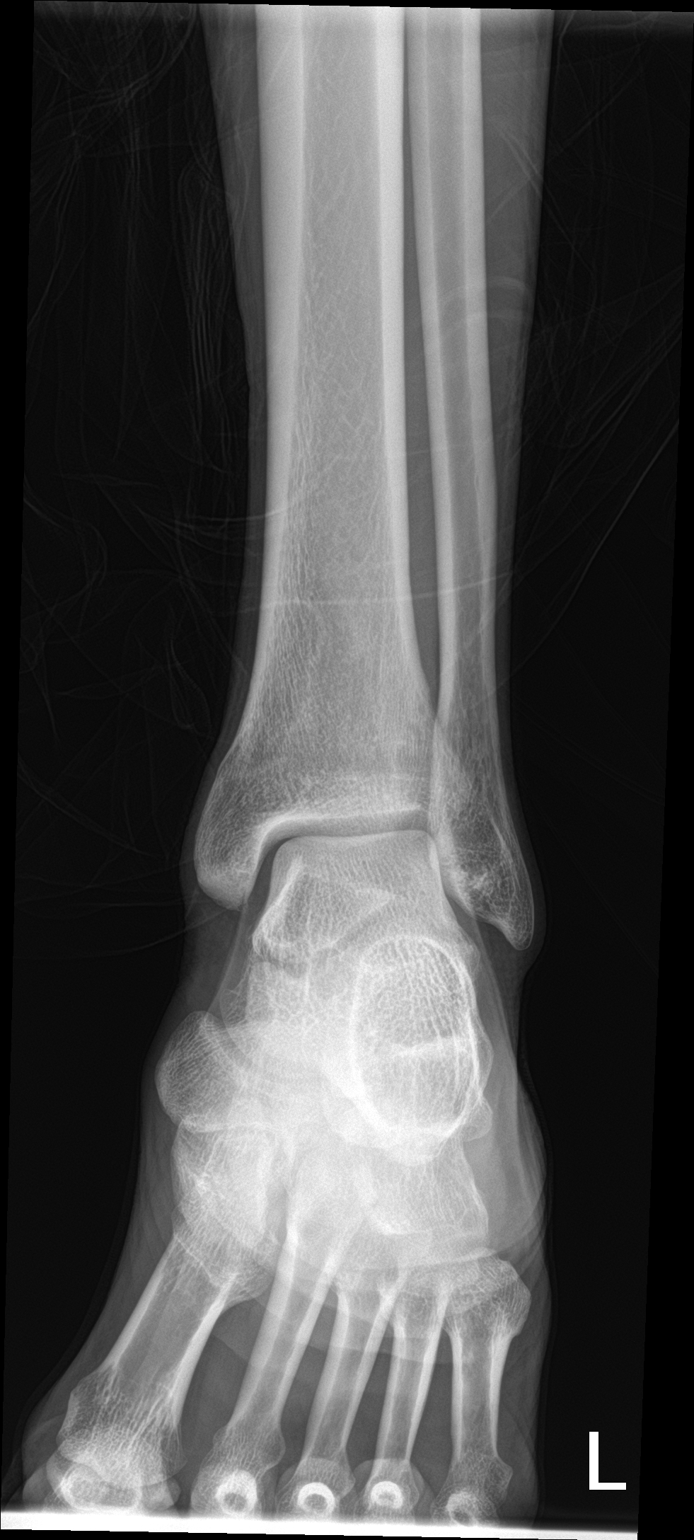

[ankle obl]
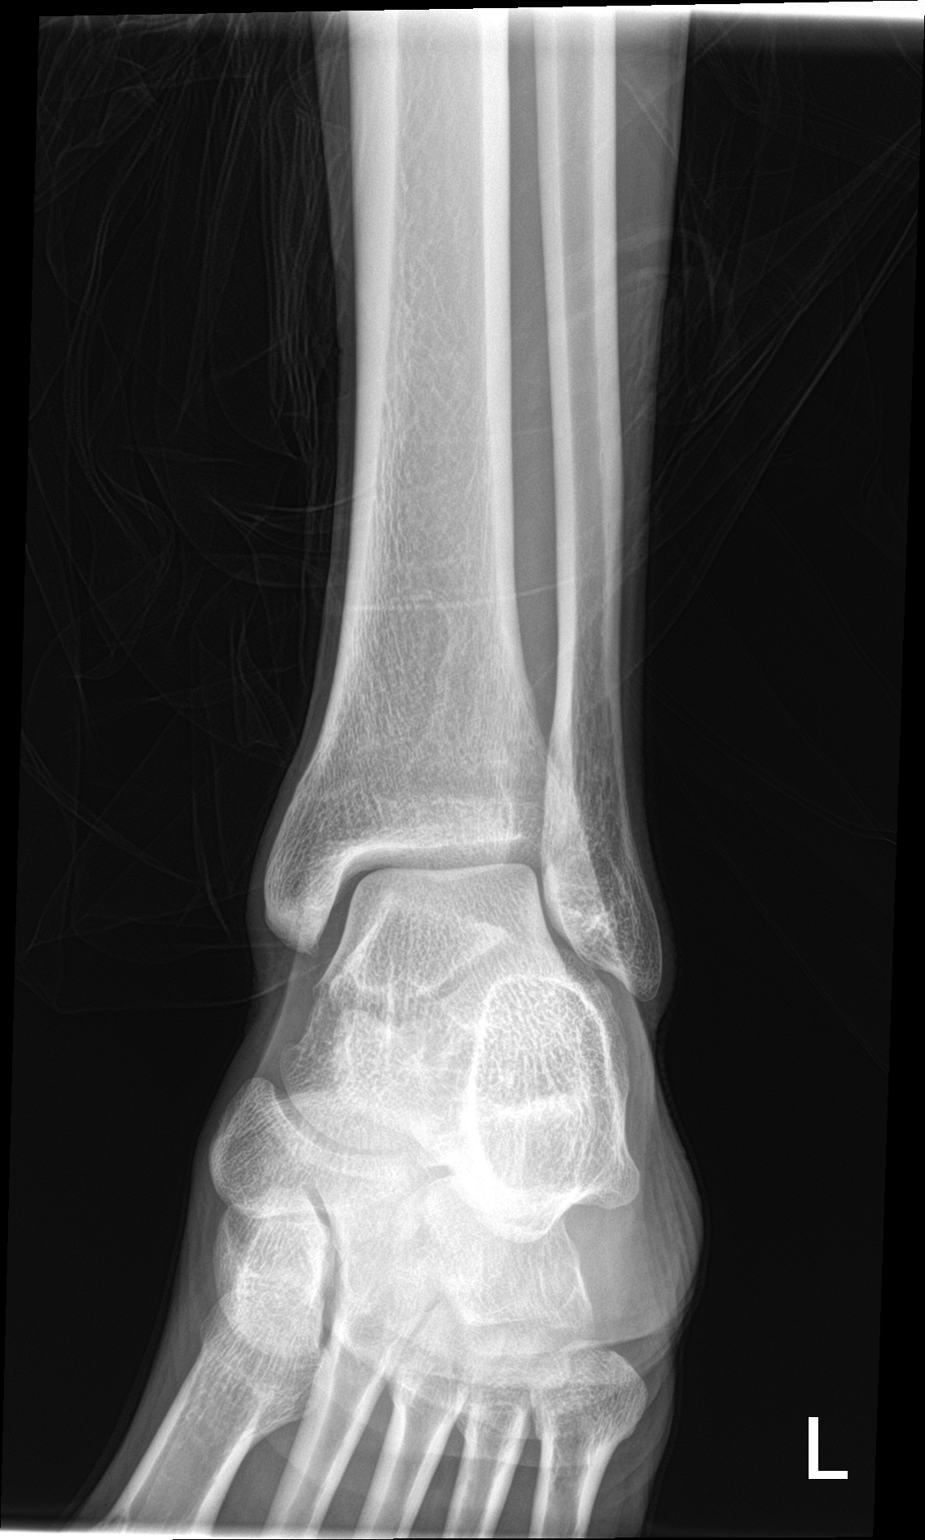

[ankle lat]
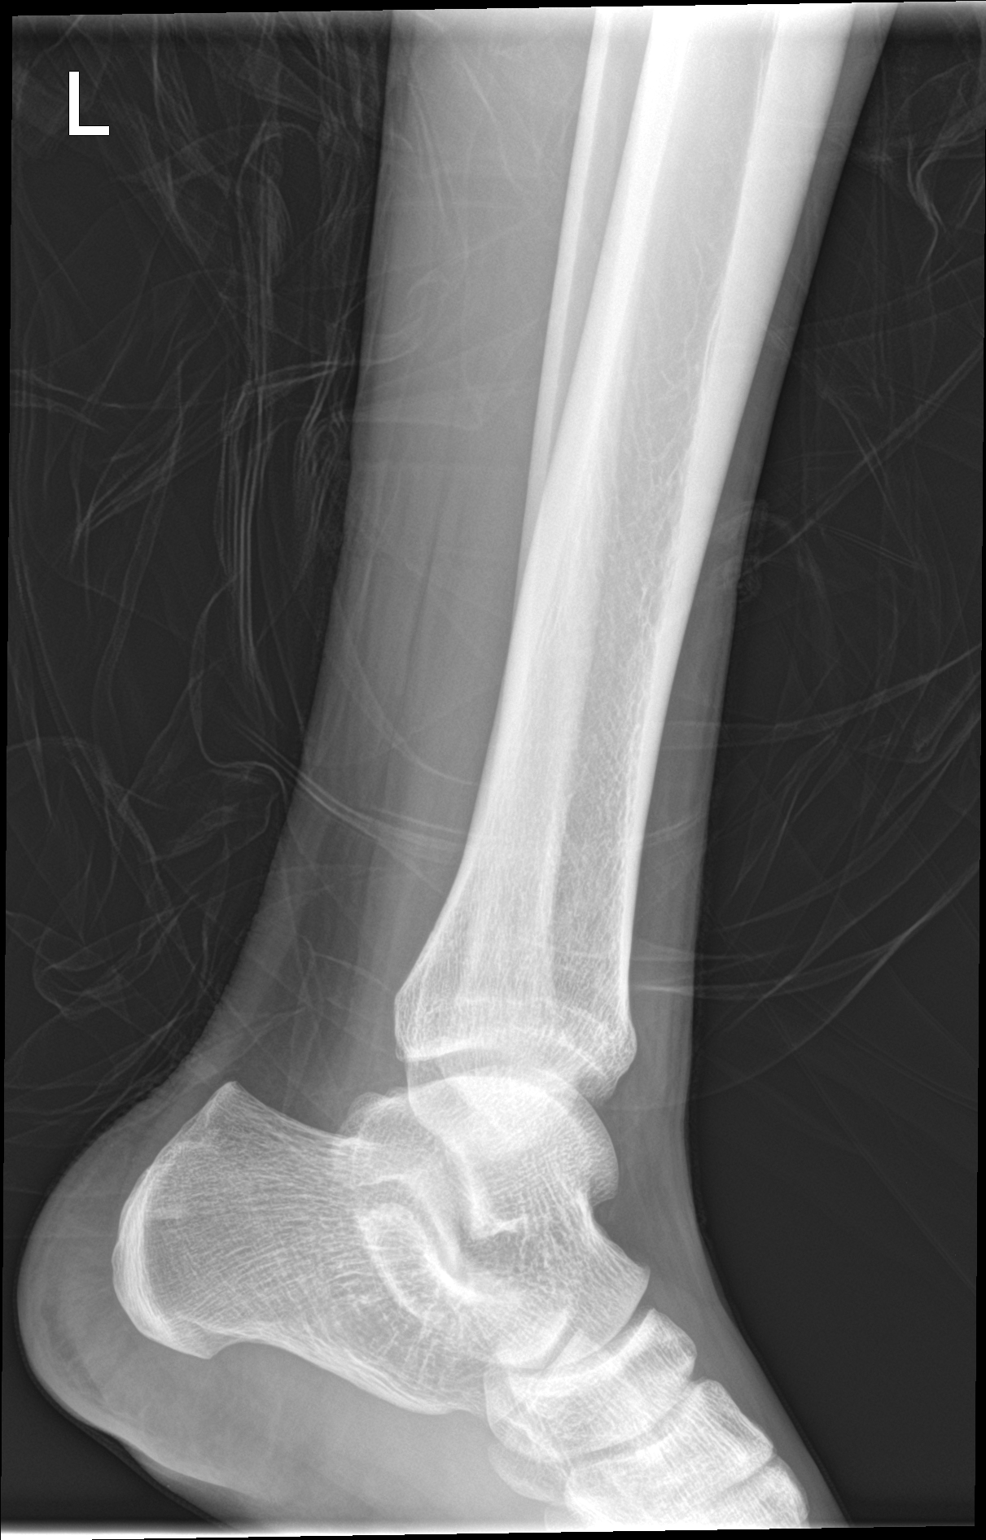

[3 of 3 positions shown; findings below may reference images not displayed]

FINDINGS: There is no evidence of fracture, dislocation, or joint effusion.
There is no evidence of arthropathy or other focal bone abnormality.
Soft tissues are unremarkable.
IMPRESSION: Negative.

## 2021-01-26 IMAGING — DX DG HIP (WITH OR WITHOUT PELVIS) 2-3V*R*
3 series · 3 of 3 positions shown · non-contrast
Comparison: [DATE]

CLINICAL DATA: Status post fall.

EXAM:
DG HIP (WITH OR WITHOUT PELVIS) 2-3V RIGHT

[hip ap]
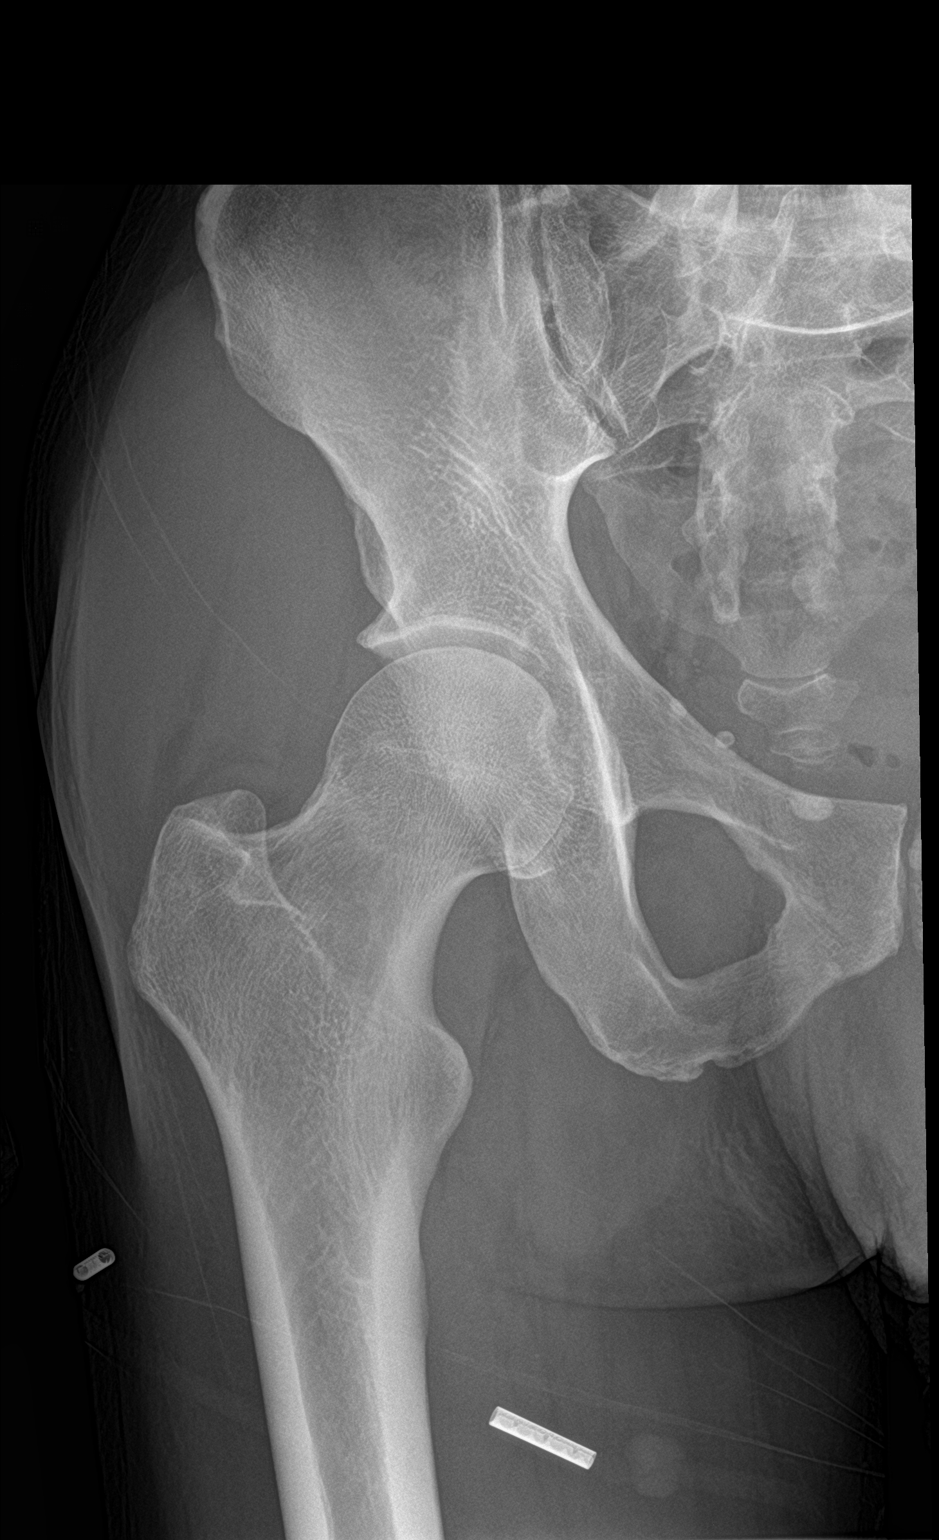

[hip lat]
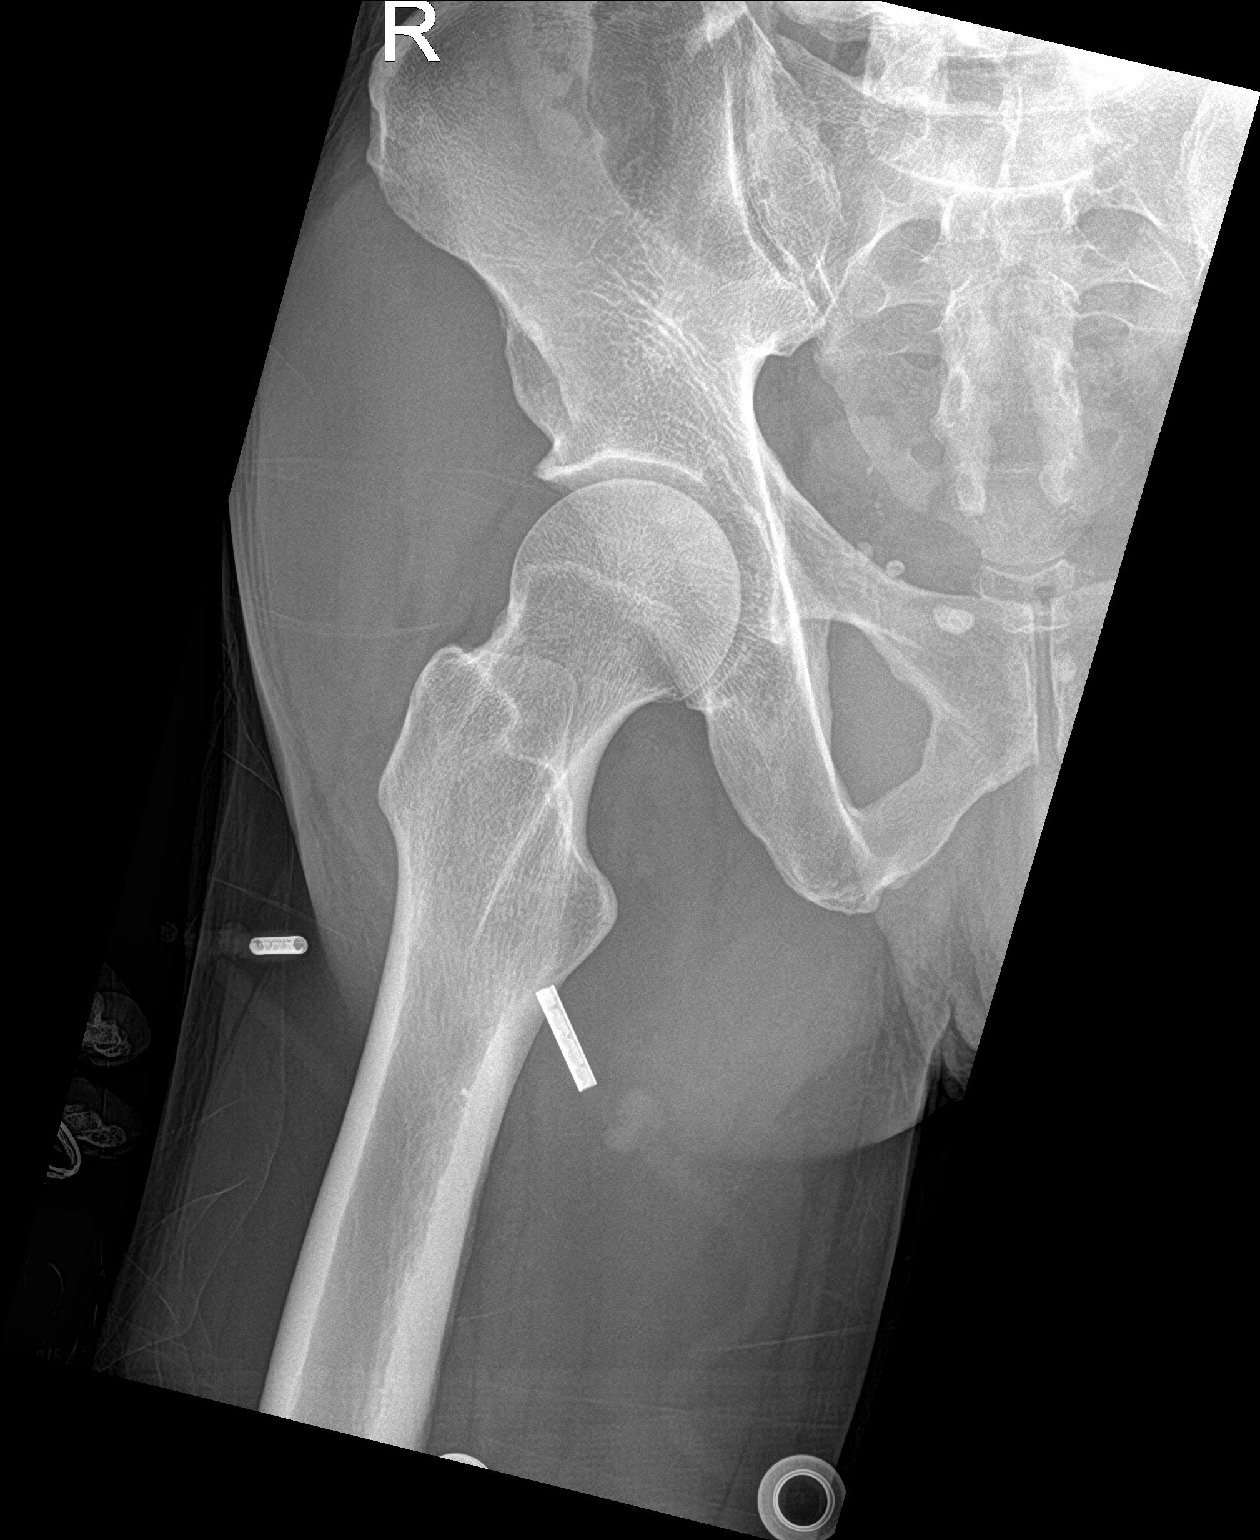

[pelvis ap]
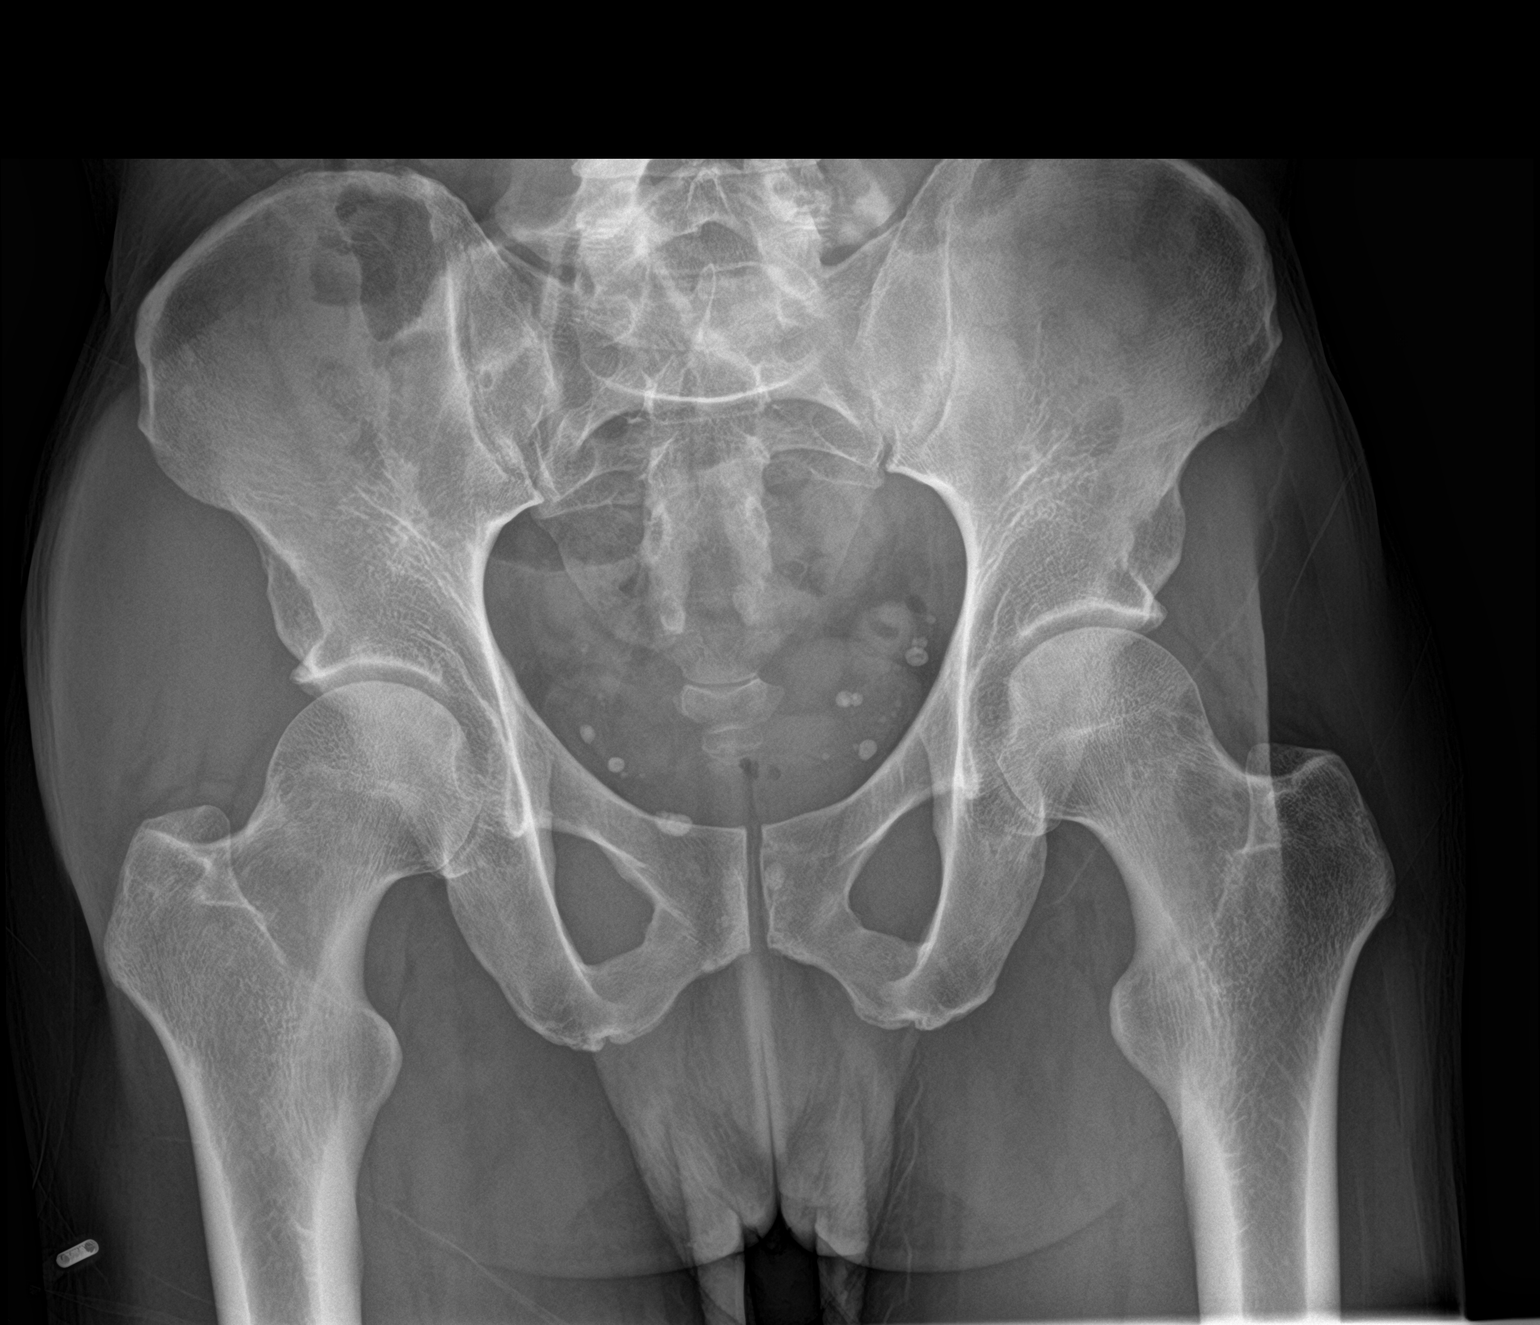

[3 of 3 positions shown; findings below may reference images not displayed]

FINDINGS: There is no evidence of hip fracture or dislocation. There is no
evidence of arthropathy or other focal bone abnormality.
IMPRESSION: Negative.

## 2021-01-26 IMAGING — DX DG RIBS W/ CHEST 3+V*R*
5 series · 5 of 5 positions shown · non-contrast
Comparison: None.

CLINICAL DATA: Status post fall.

EXAM:
RIGHT RIBS AND CHEST - 3+ VIEW

[chest ap]
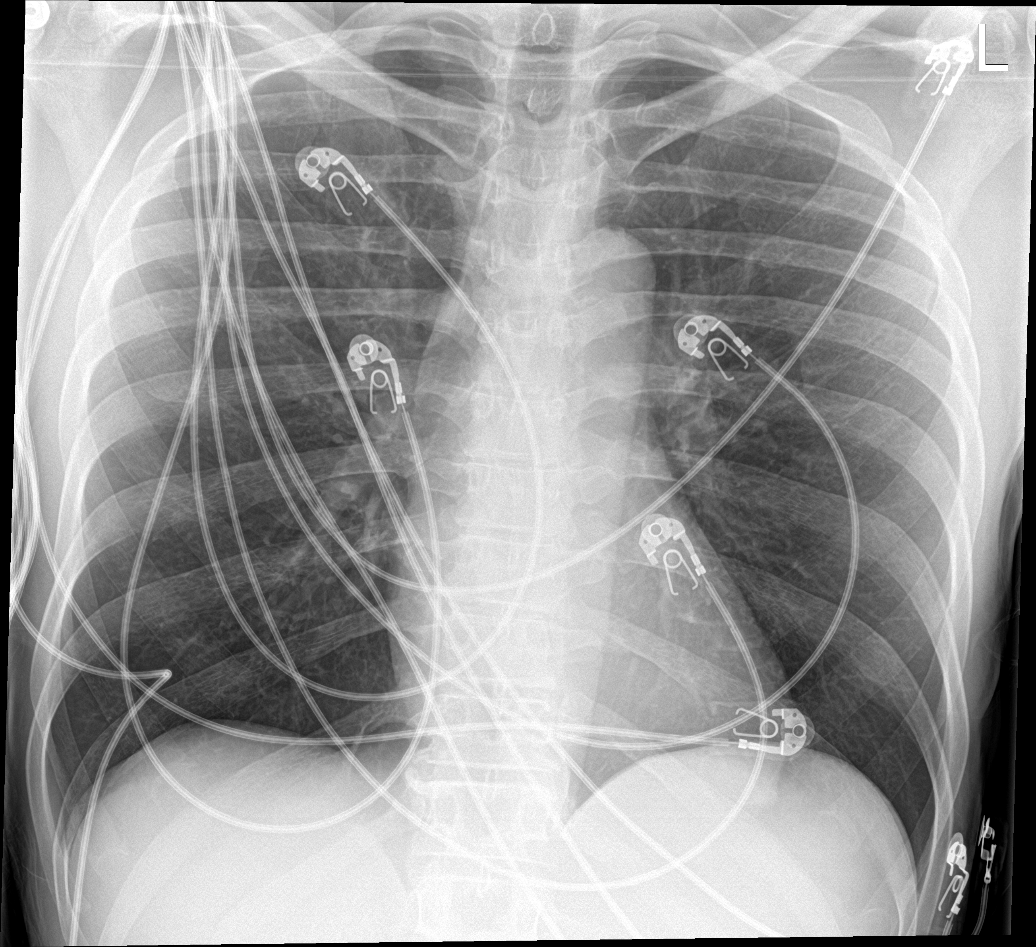

[rib ap (1 of 2)]
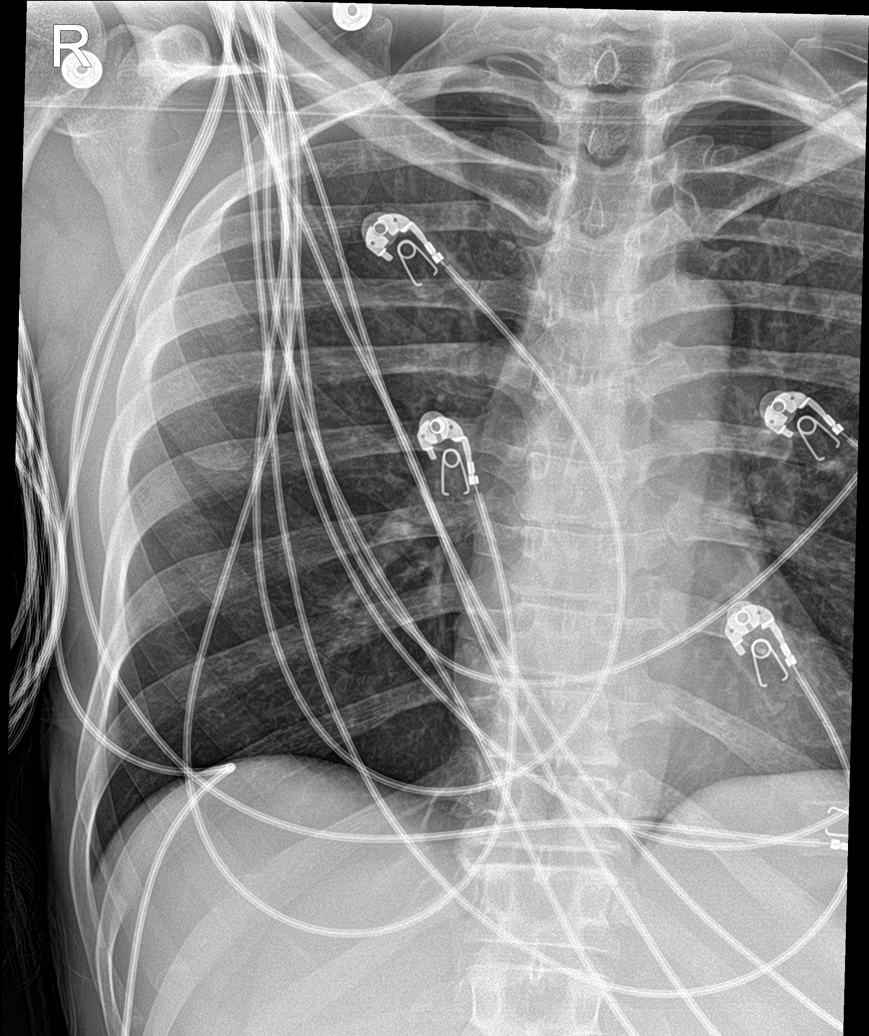

[rib ap obl (1 of 2)]
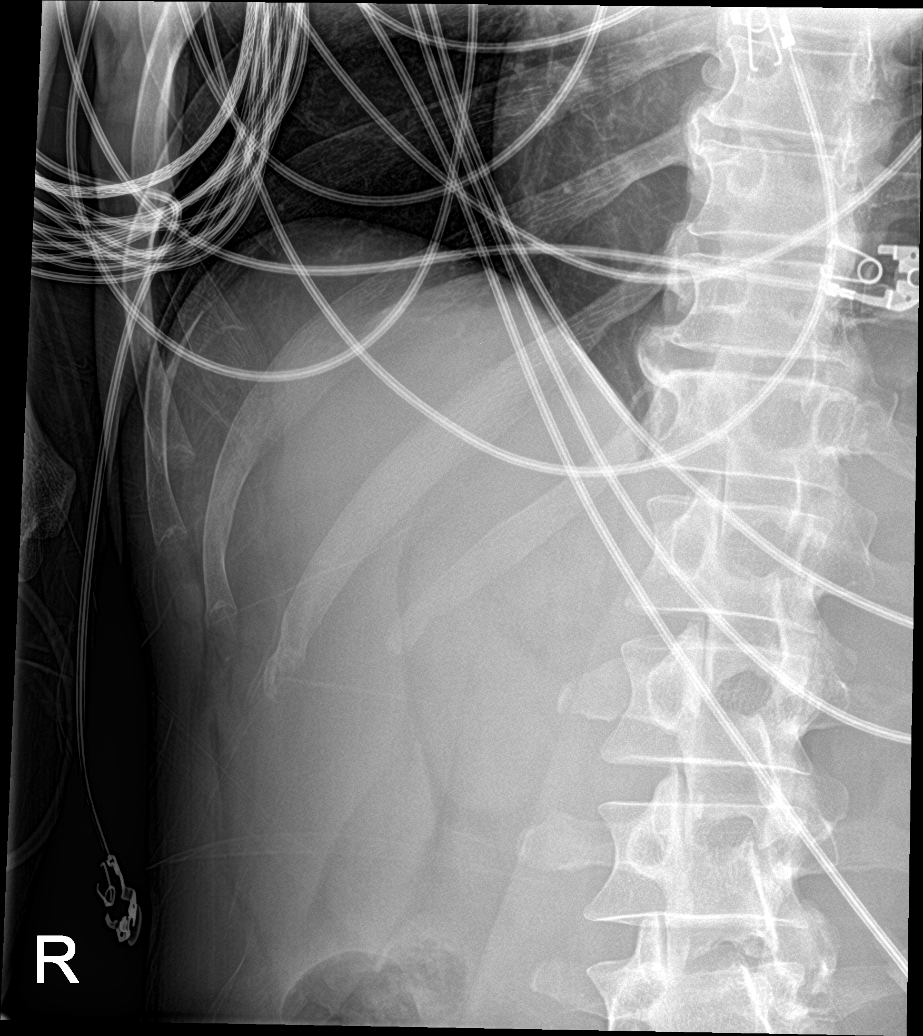

[rib ap (2 of 2)]
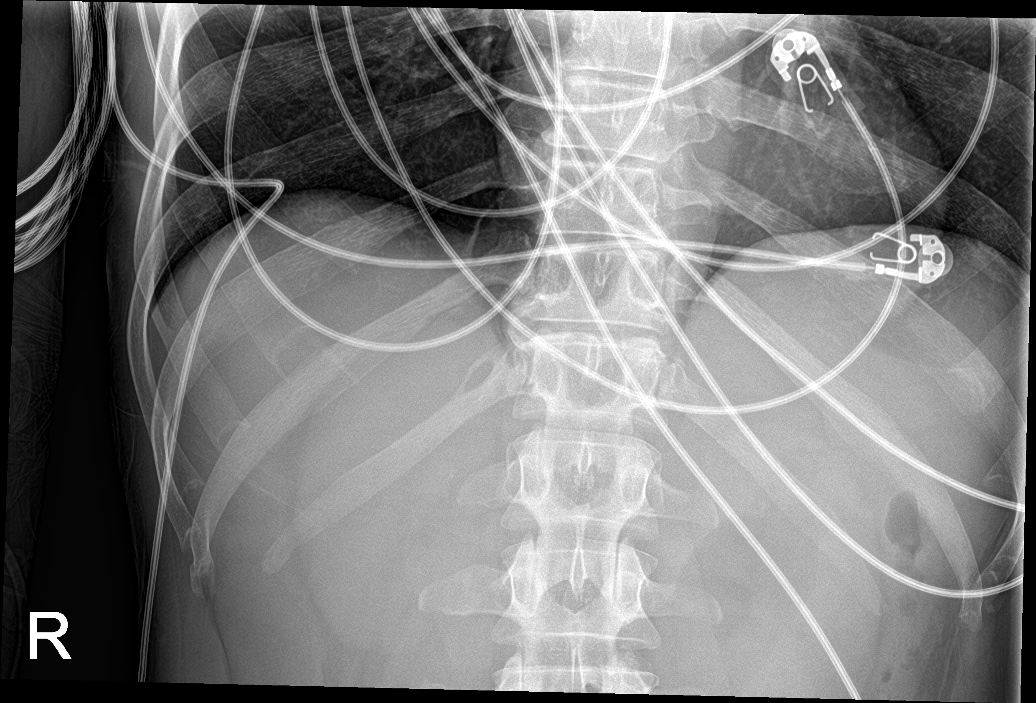

[rib ap obl (2 of 2)]
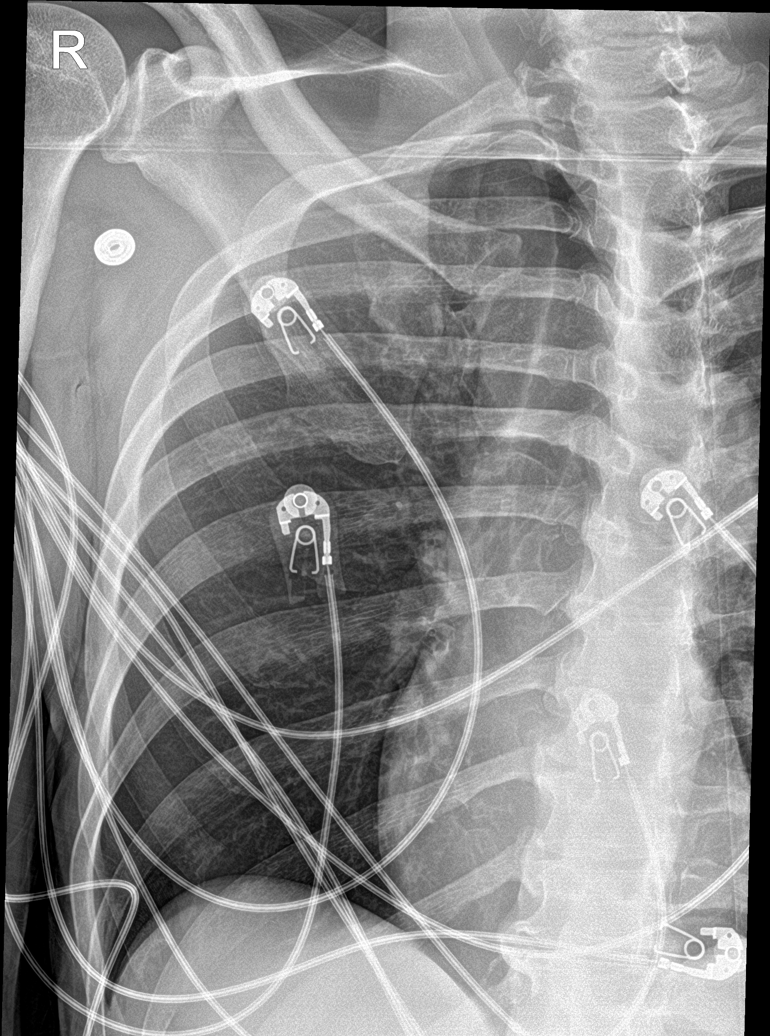

[5 of 5 positions shown; findings below may reference images not displayed]

FINDINGS: No acute displaced right rib fracture or other bone lesions are seen
involving the ribs.

The heart and mediastinal contours are within normal limits.

No focal consolidation. No pulmonary edema. No pleural effusion. No
pneumothorax.

No acute osseous abnormality.
IMPRESSION: 1. No acute displaced right rib fracture. Please note, nondisplaced
rib fractures may be occult on radiograph.
2. No acute cardiopulmonary abnormality.

## 2021-01-26 IMAGING — DX DG ANKLE COMPLETE 3+V*R*
3 series · 3 of 3 positions shown · non-contrast
Comparison: None.

CLINICAL DATA: Chills and cough, fall.

EXAM:
RIGHT ANKLE - COMPLETE 3+ VIEW

[ankle ap]
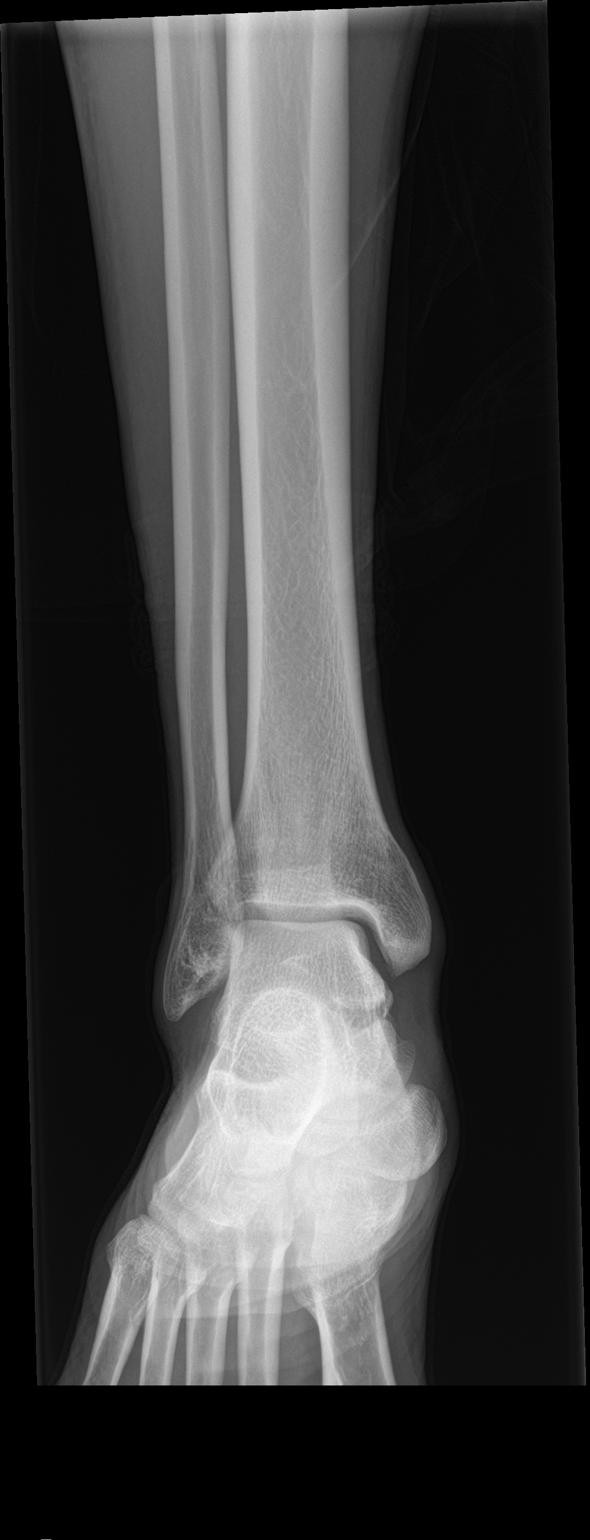

[ankle obl]
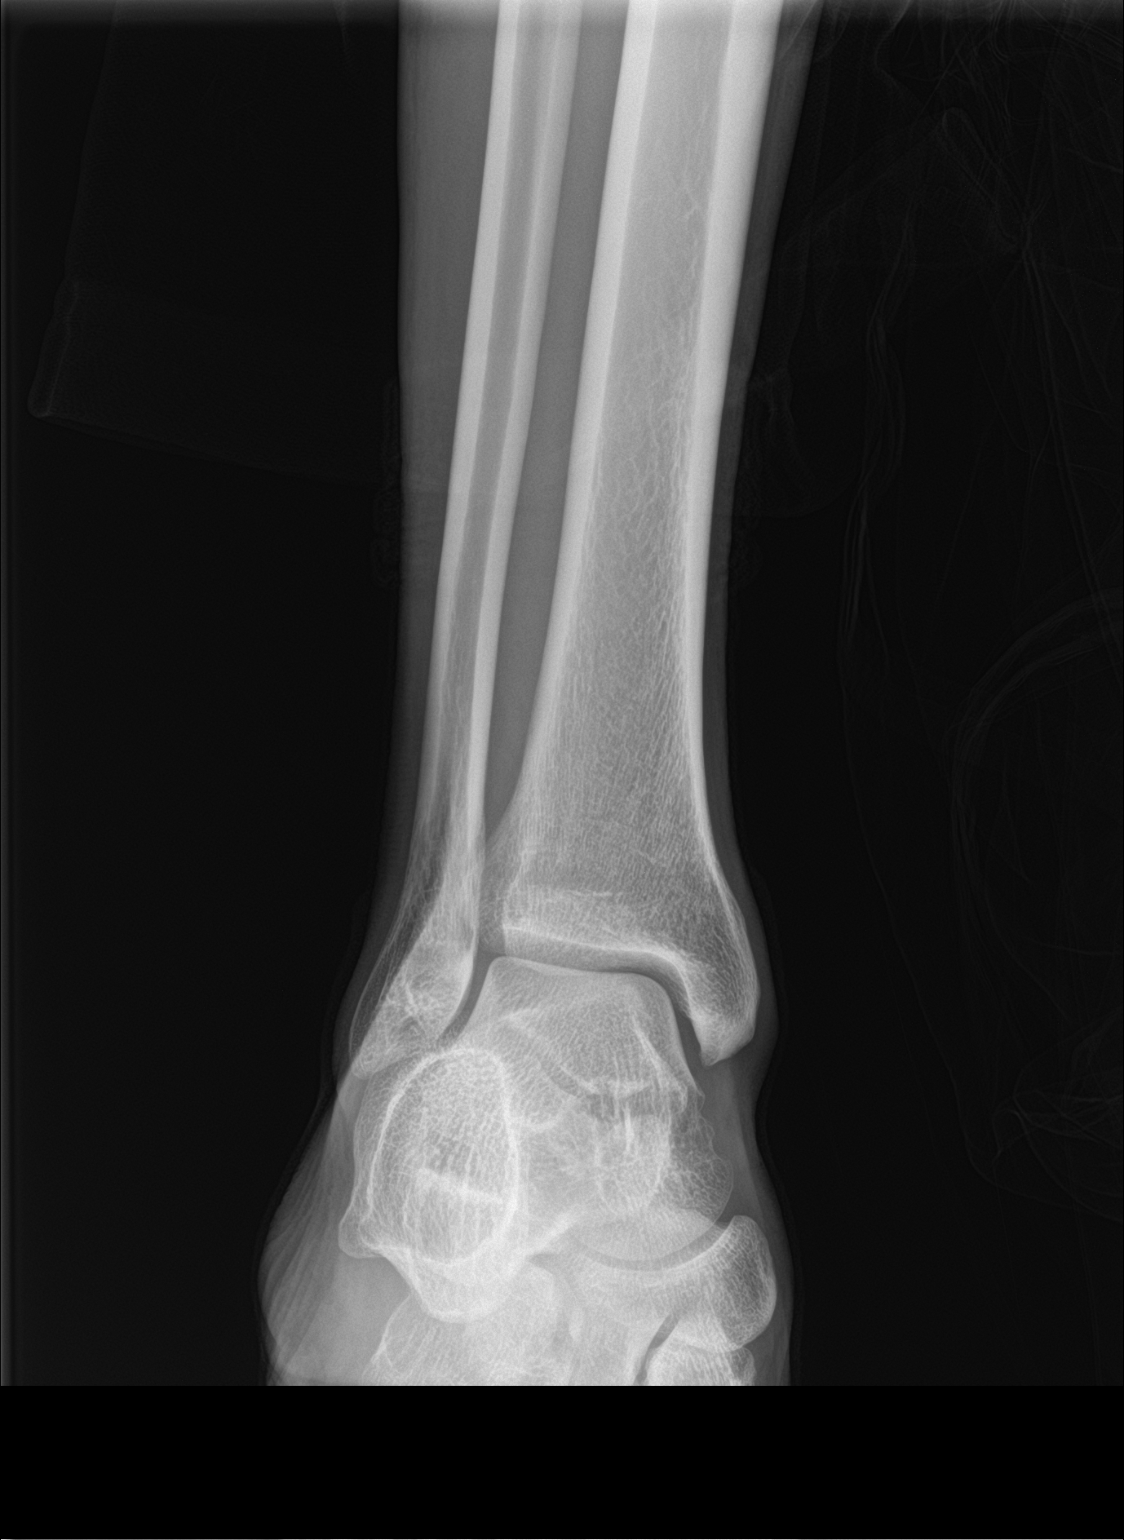

[ankle lat]
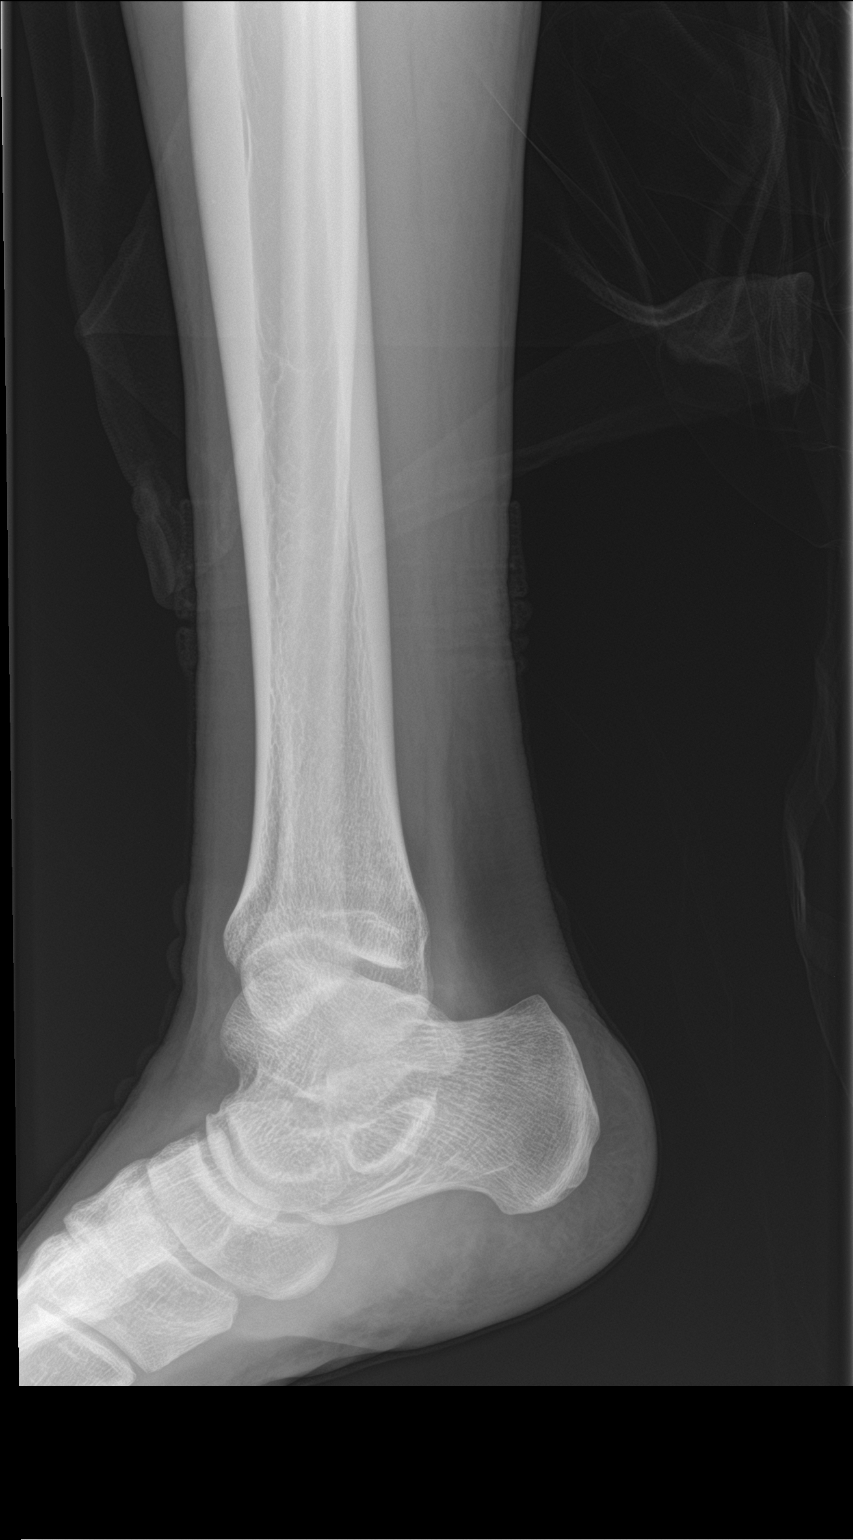

[3 of 3 positions shown; findings below may reference images not displayed]

FINDINGS: There is no evidence of fracture, dislocation, or joint effusion.
There is no evidence of arthropathy or other focal bone abnormality.
Soft tissues are unremarkable.
IMPRESSION: Negative.

## 2021-01-26 IMAGING — US US RENAL
1 series · 14 of 25 positions shown · non-contrast
Comparison: None.

CLINICAL DATA: Acute kidney injury

EXAM:
RENAL / URINARY TRACT ULTRASOUND COMPLETE

[Series 1: us renal · 14 of 32 slices shown]
[im 1/32]
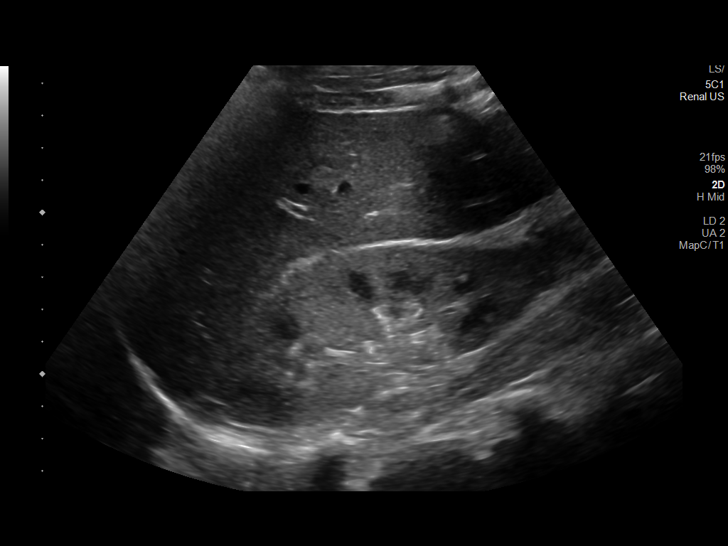
[im 3/32]
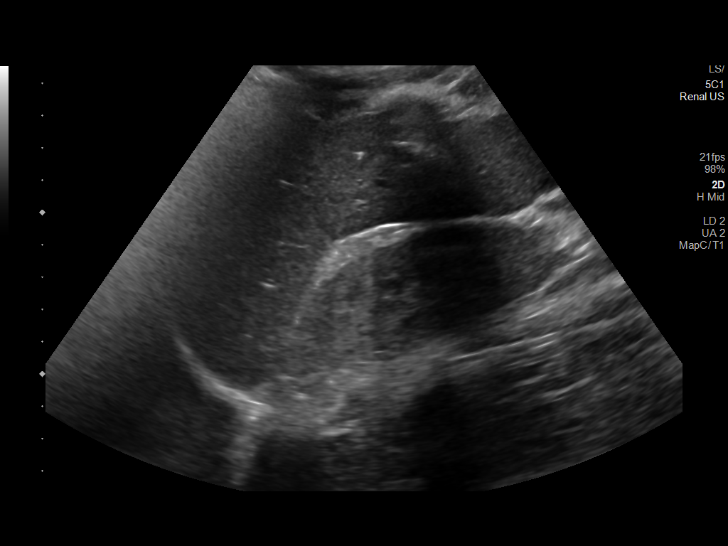
[im 6/32]
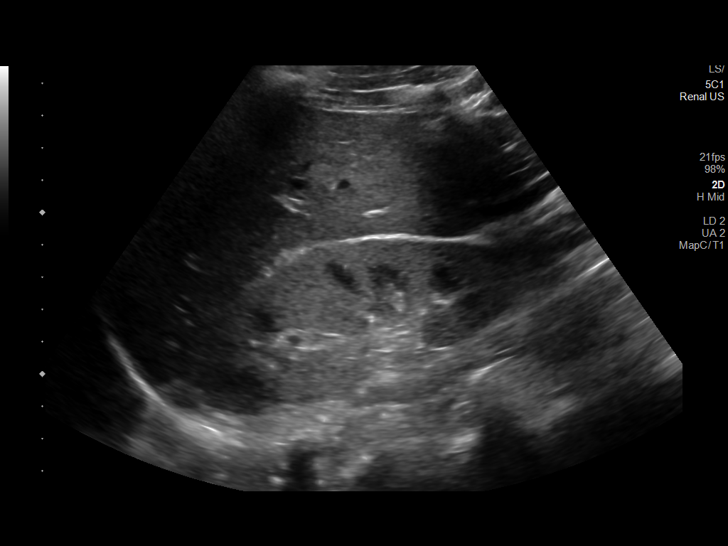
[im 8/32]
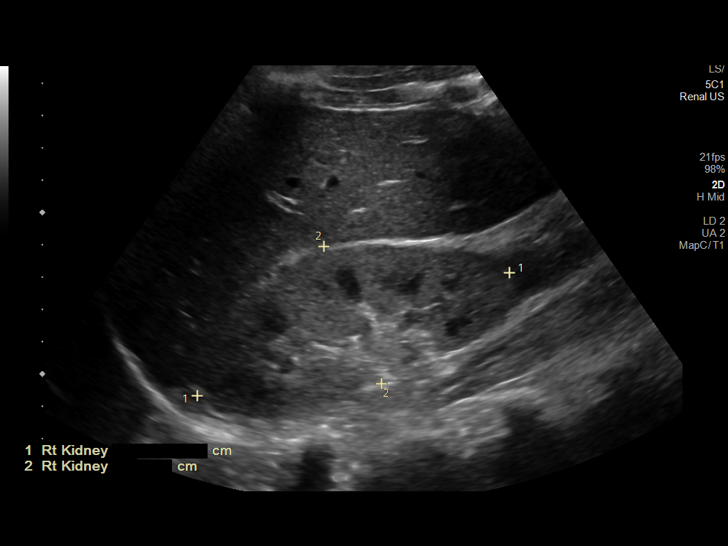
[im 11/32]
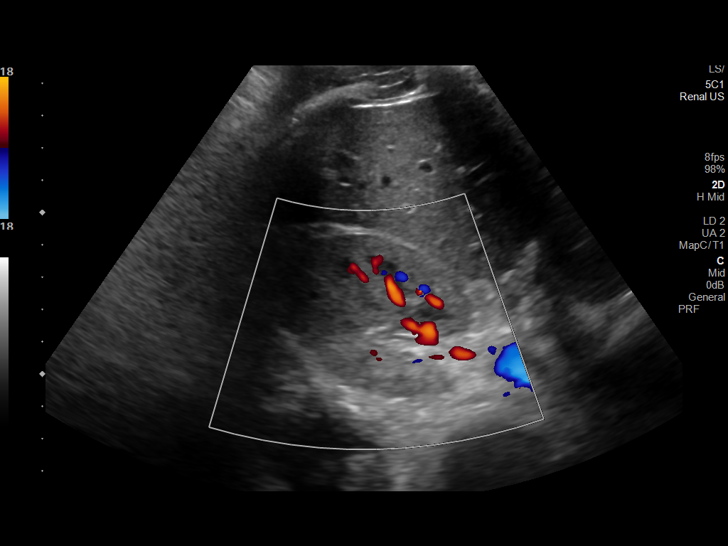
[im 12/32]
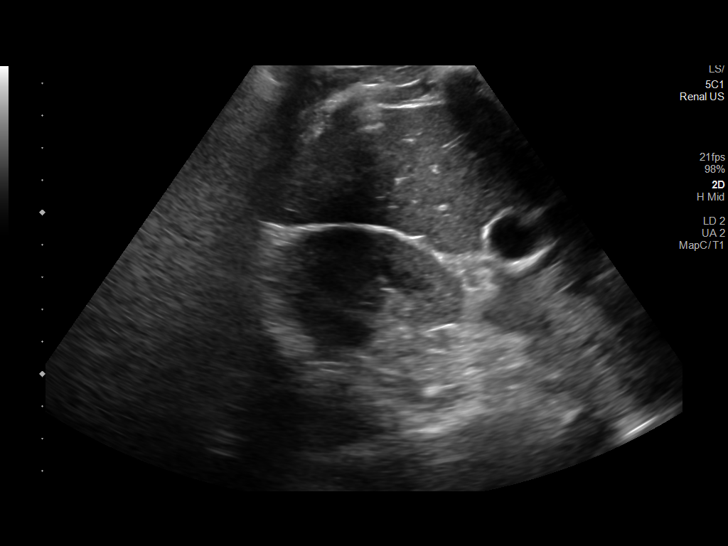
[im 15/32]
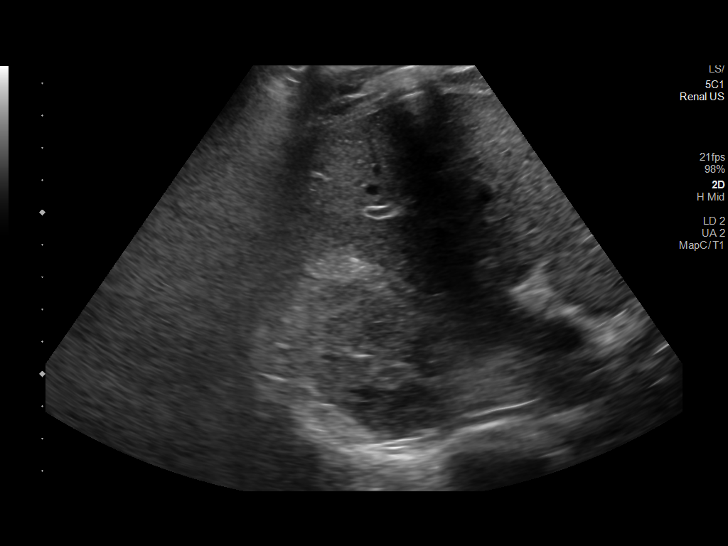
[im 17/32]
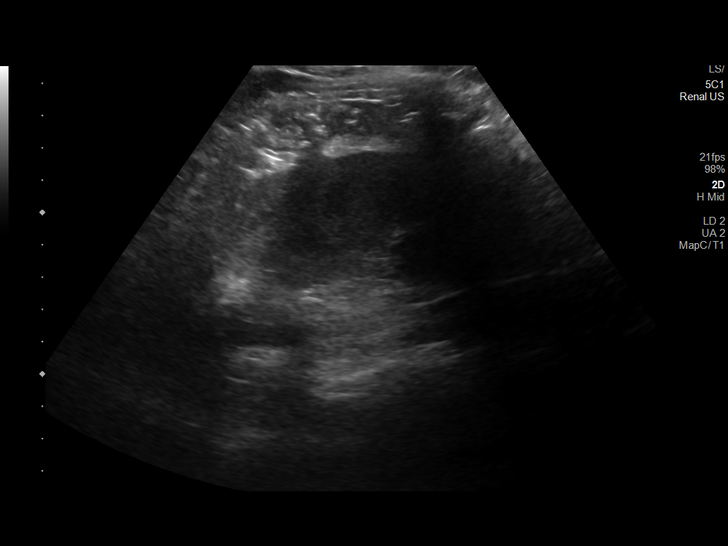
[im 20/32]
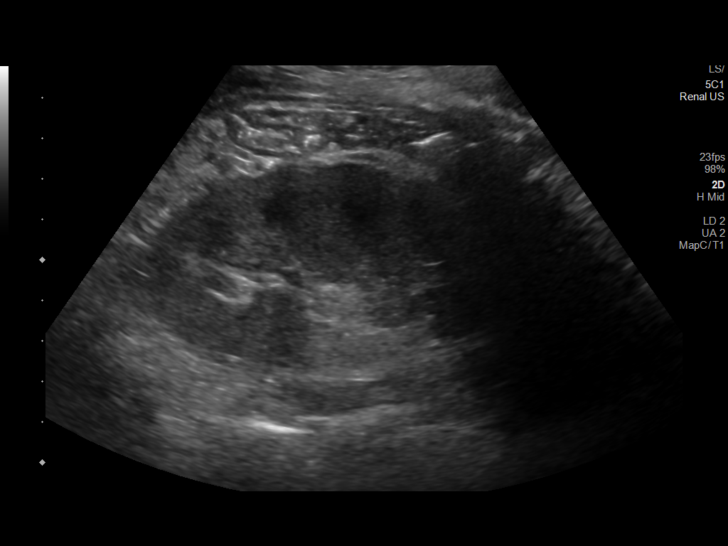
[im 21/32]
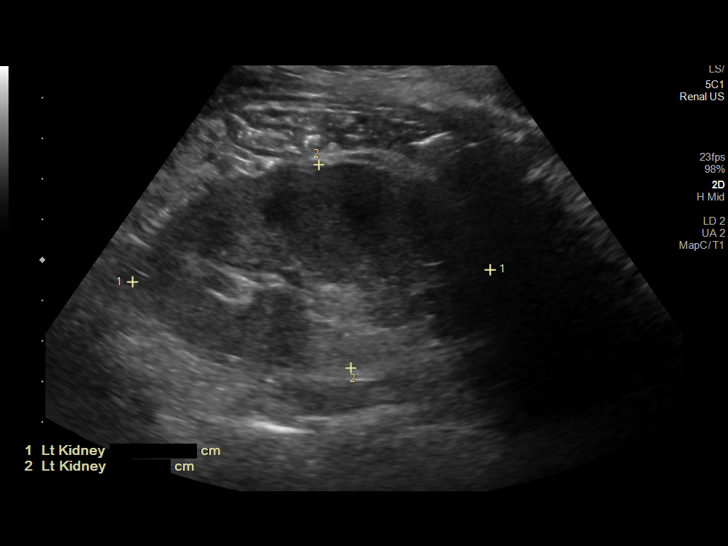
[im 24/32]
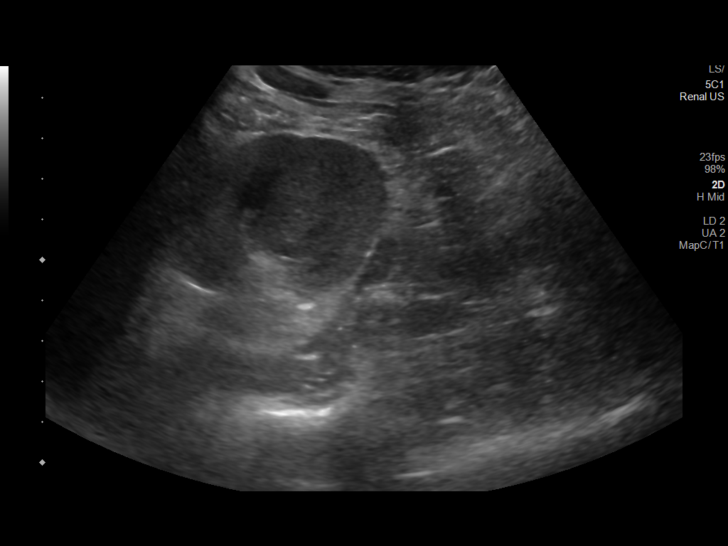
[im 26/32]
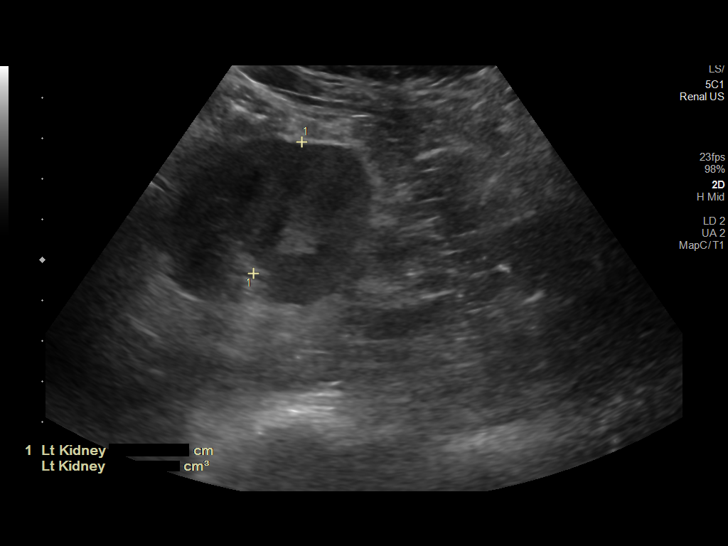
[im 29/32]
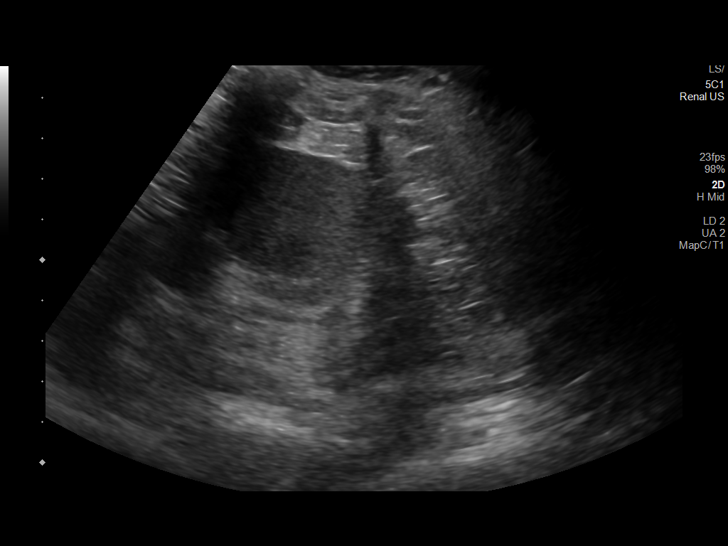
[im 32/32]
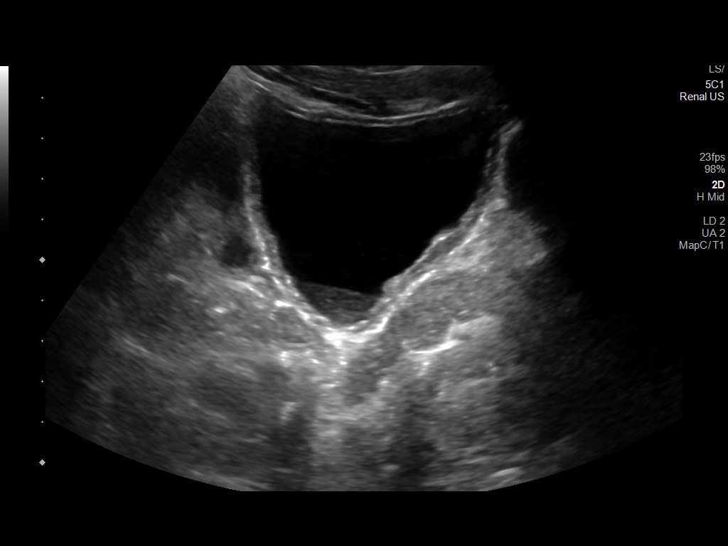

[14 of 25 positions shown; findings below may reference images not displayed]

FINDINGS: Right Kidney:

Renal measurements: 10.4 x 4.6 x 4.2 cm = volume: 105.3 mL. Cortex
appears echogenic. No mass or hydronephrosis.

Left Kidney:

Renal measurements: 8.8 x 5.1 x 3.5 cm = volume: 81.3 mL. Cortex
appears slightly echogenic. No mass or hydronephrosis

Bladder:

Debris in the urinary bladder

Other:

None.
IMPRESSION: 1. Kidneys appear slightly echogenic suggesting medical renal
disease. No hydronephrosis
2. Debris in the urinary bladder

## 2021-01-26 IMAGING — CT CT HEAD W/O CM
3 series · 15 of 47 positions shown, 18 images · non-contrast
Comparison: [DATE]

CLINICAL DATA: Head trauma, mod-severe; Neck trauma, intoxicated or
obtunded (Age >= 16y). EtOH on board, syncope/fall and hit head.

EXAM:
CT HEAD WITHOUT CONTRAST
CT CERVICAL SPINE WITHOUT CONTRAST
TECHNIQUE: Multidetector CT imaging of the head and cervical spine was
performed following the standard protocol without intravenous
contrast. Multiplanar CT image reconstructions of the cervical spine
were also generated.

[Series 4: head 5.0 h30s · axial · 0.42mm/px · z∈[+1150,+1285]mm · 9 of 33 slices shown, 12 images]
[im 3/33  brain]
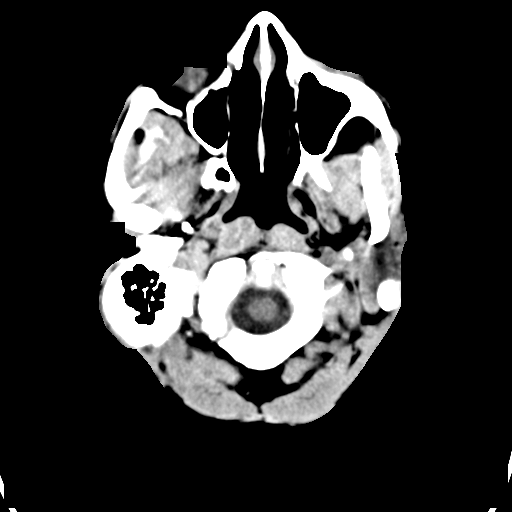
[im 3/33  bone]
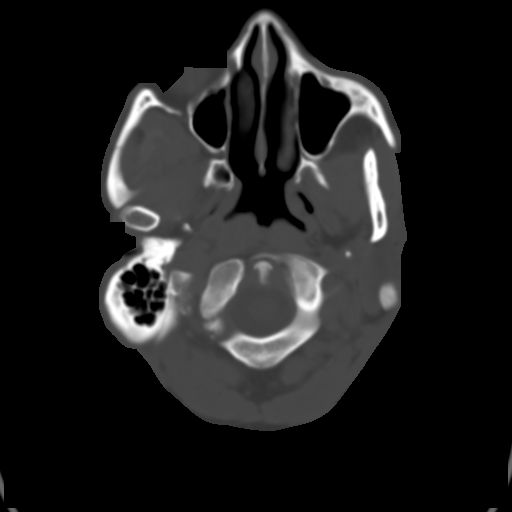
[im 6/33  brain]
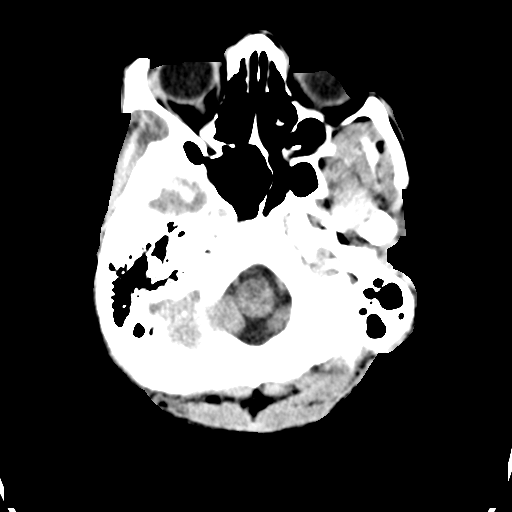
[im 9/33  brain]
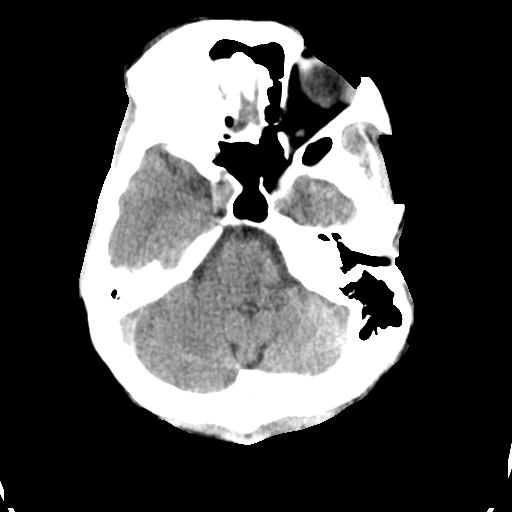
[im 13/33  brain]
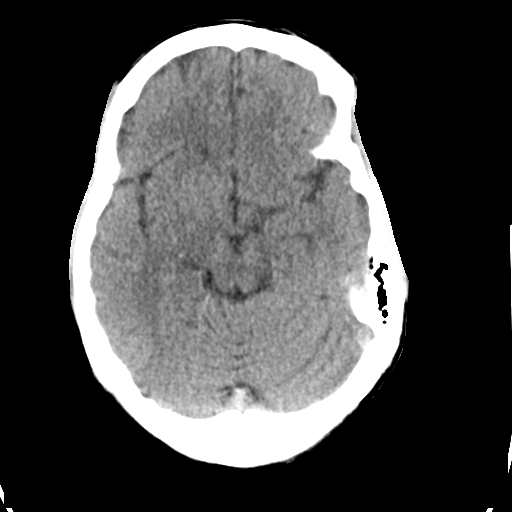
[im 17/33  brain]
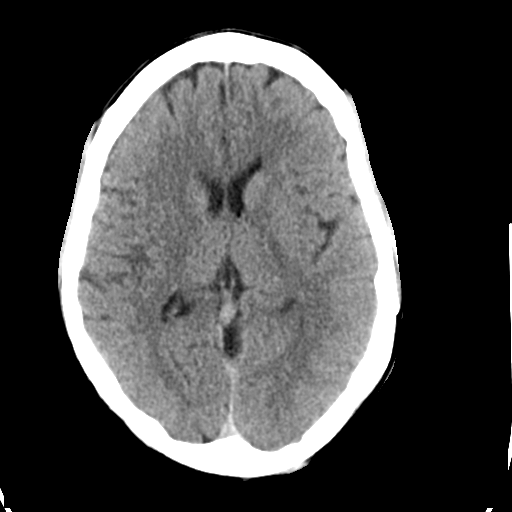
[im 17/33  bone]
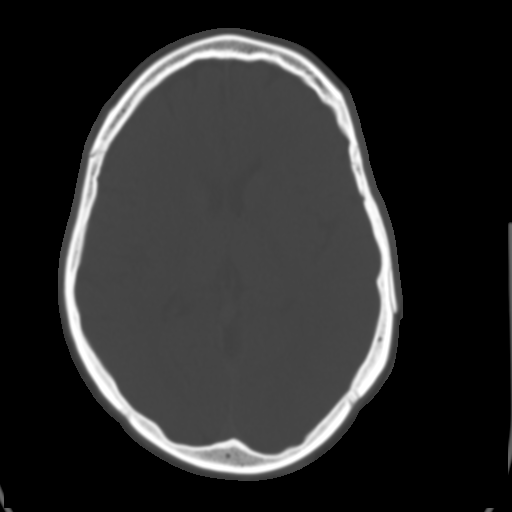
[im 20/33  brain]
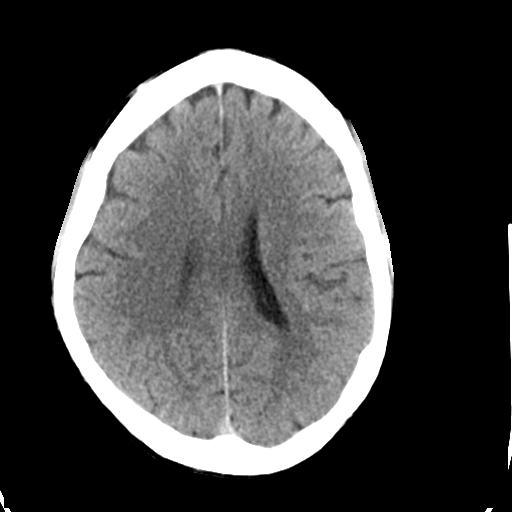
[im 24/33  brain]
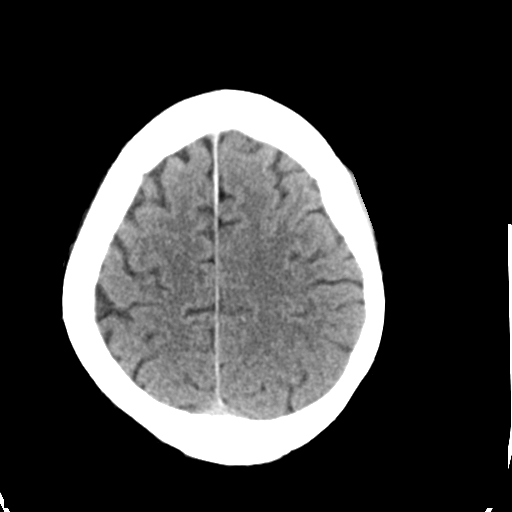
[im 27/33  brain]
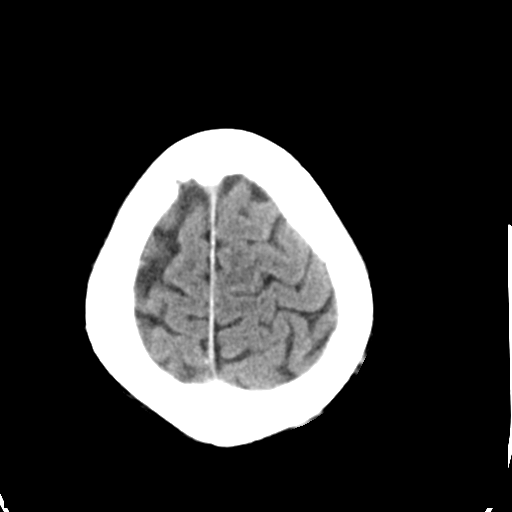
[im 30/33  brain]
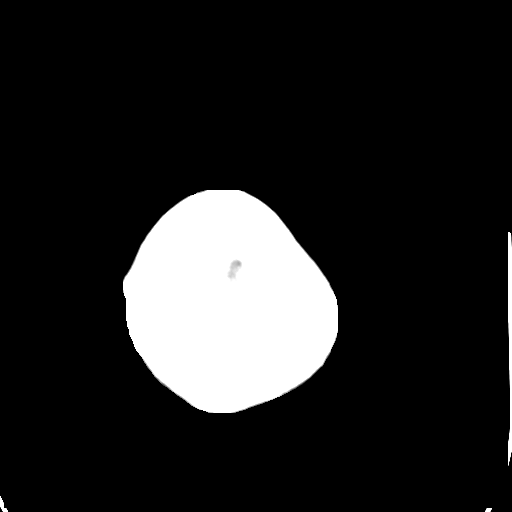
[im 30/33  bone]
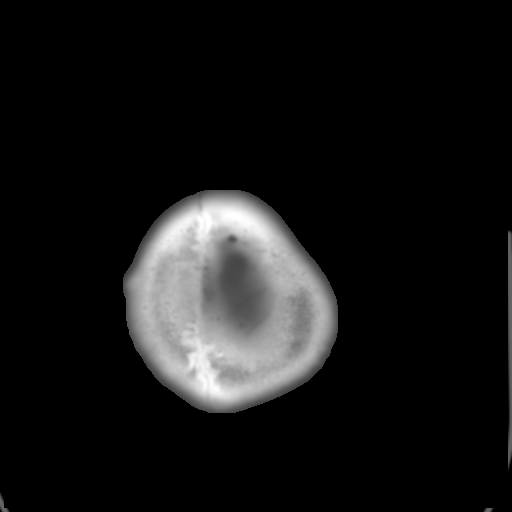

[Series 5: head 3.0 mpr cor · coronal · 0.33mm/px · 3 of 67 slices shown]
[im 23/67  brain]
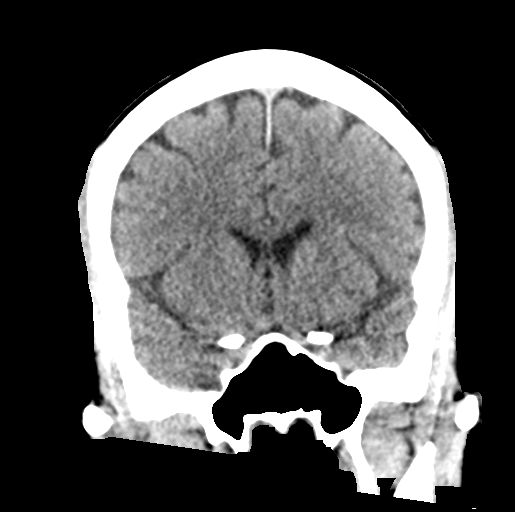
[im 30/67  brain]
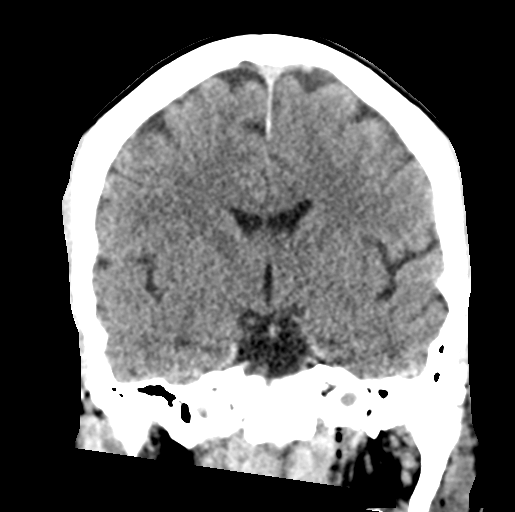
[im 37/67  brain]
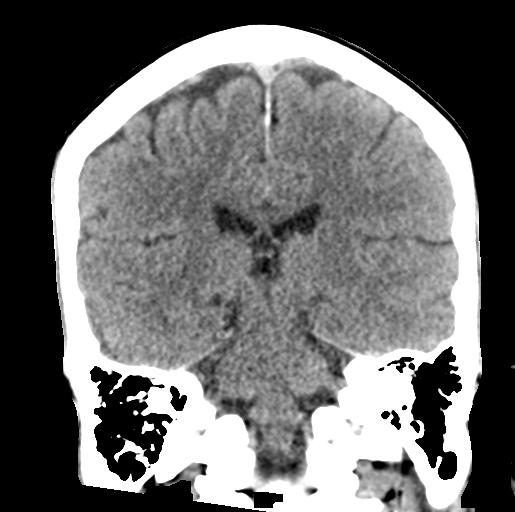

[Series 6: head 3.0 mpr sag · sagittal · 0.35mm/px · 3 of 57 slices shown]
[im 19/57  brain]
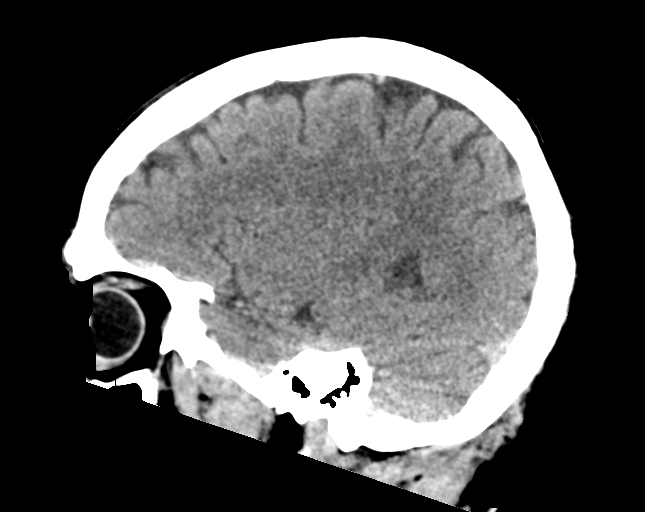
[im 29/57  brain]
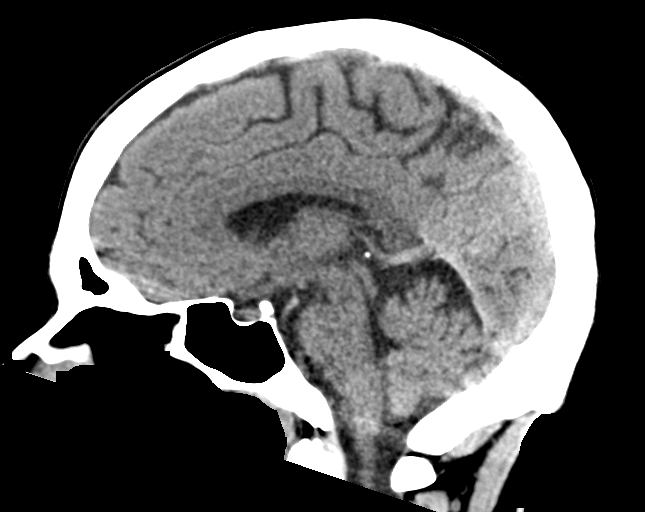
[im 38/57  brain]
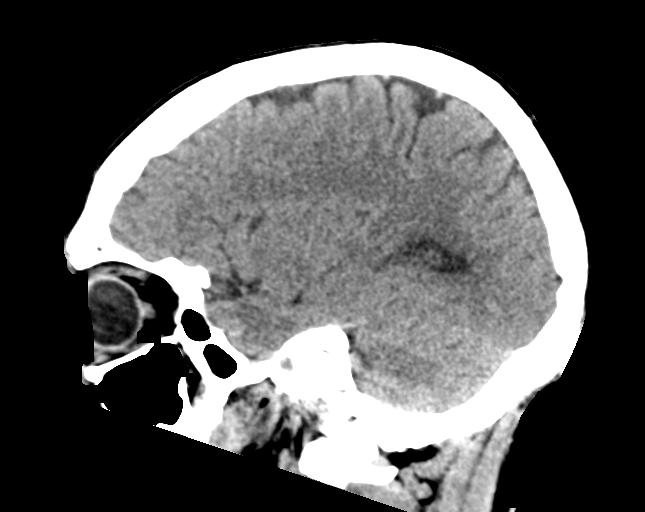

[15 of 47 positions shown; findings below may reference images not displayed]

FINDINGS: CT HEAD FINDINGS

Brain: There is no evidence of an acute infarct, intracranial
hemorrhage, mass, midline shift, or extra-axial fluid collection.
The ventricles and sulci are normal.

Vascular: No hyperdense vessel.

Skull: No fracture or suspicious osseous lesion.

Sinuses/Orbits: Visualized paranasal sinuses and mastoid air cells
are clear. Unremarkable orbits.

Other: None.

CT CERVICAL SPINE FINDINGS

Alignment: Straightening of the normal cervical lordosis. Chronic
trace anterolisthesis of C4 on C5.

Skull base and vertebrae: No acute fracture or suspicious osseous
lesion.

Soft tissues and spinal canal: No prevertebral fluid or swelling. No
visible canal hematoma.

Disc levels: Similar appearance of multilevel disc degeneration,
greatest at C5-6 and C6-7 with uncovertebral spurring resulting in
moderate bilateral neural foraminal stenosis.

Upper chest: Clear lung apices.

Other: None.
IMPRESSION: 1. No evidence of acute intracranial abnormality.
2. No acute cervical spine fracture.

## 2021-01-26 IMAGING — CT CT CERVICAL SPINE W/O CM
3 of 4 series · 13 of 35 positions shown, 16 images · non-contrast
Comparison: [DATE]

CLINICAL DATA: Head trauma, mod-severe; Neck trauma, intoxicated or
obtunded (Age >= 14y). EtOH on board, syncope/fall and hit head.

EXAM:
CT HEAD WITHOUT CONTRAST
CT CERVICAL SPINE WITHOUT CONTRAST
TECHNIQUE: Multidetector CT imaging of the head and cervical spine was
performed following the standard protocol without intravenous
contrast. Multiplanar CT image reconstructions of the cervical spine
were also generated.

[Series 4: c_spine 2.0 st · axial · 0.30mm/px · z∈[+1016,+1142]mm · 5 of 95 slices shown, 7 images]
[im 16/95  soft-tissue]
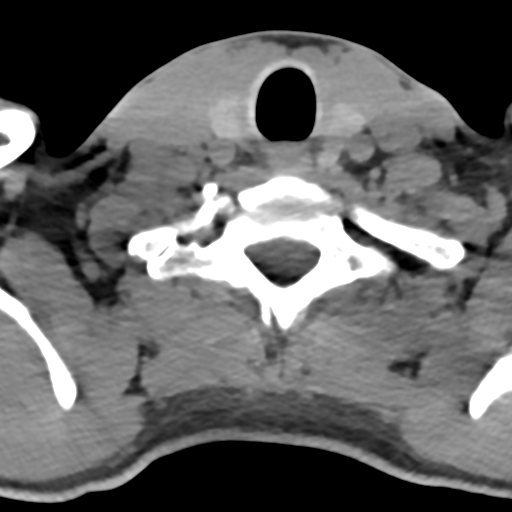
[im 16/95  bone]
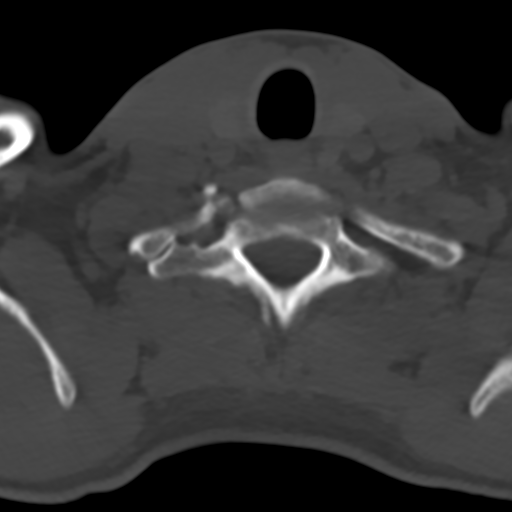
[im 32/95  bone]
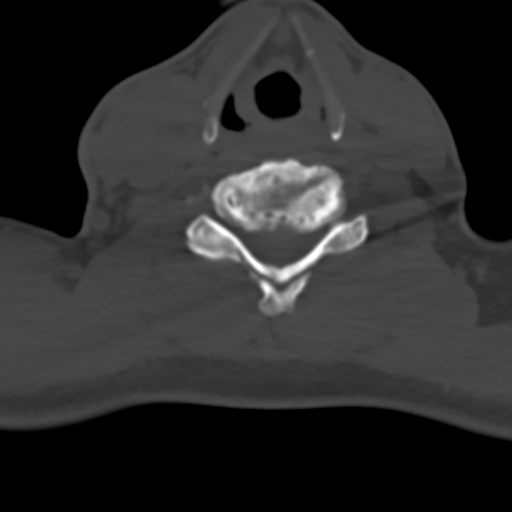
[im 48/95  bone]
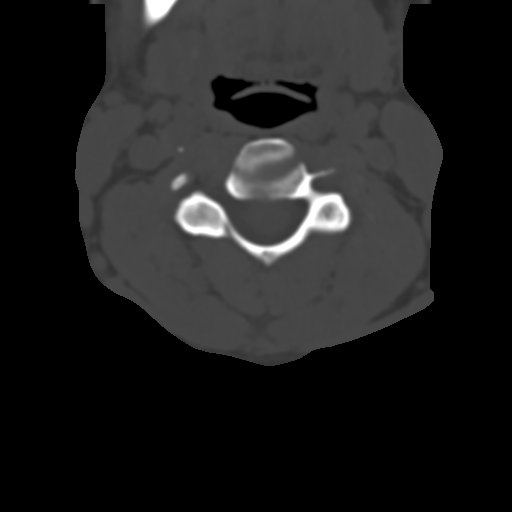
[im 63/95  bone]
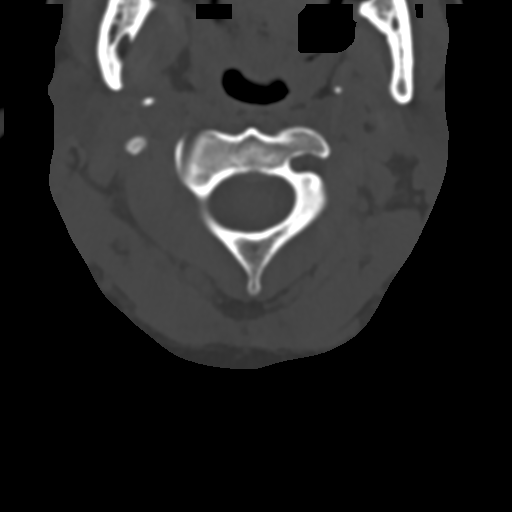
[im 79/95  soft-tissue]
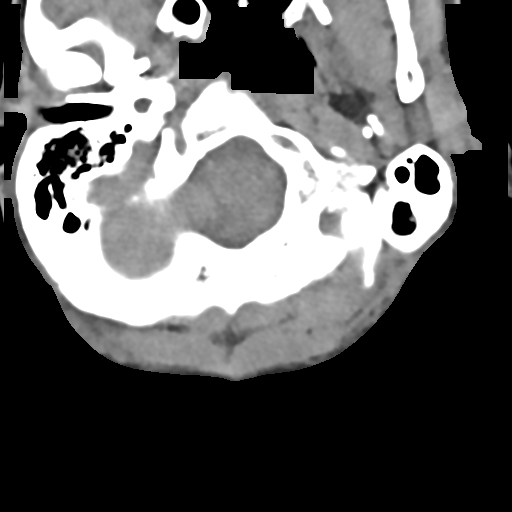
[im 79/95  bone]
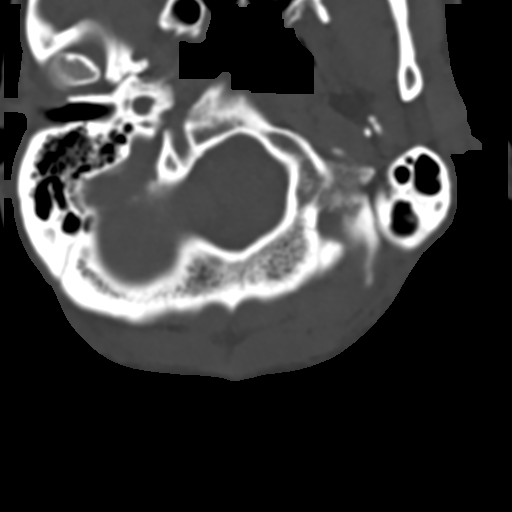

[Series 6: coronal bone · coronal · 0.31mm/px · 3 of 61 slices shown]
[im 13/61  bone]
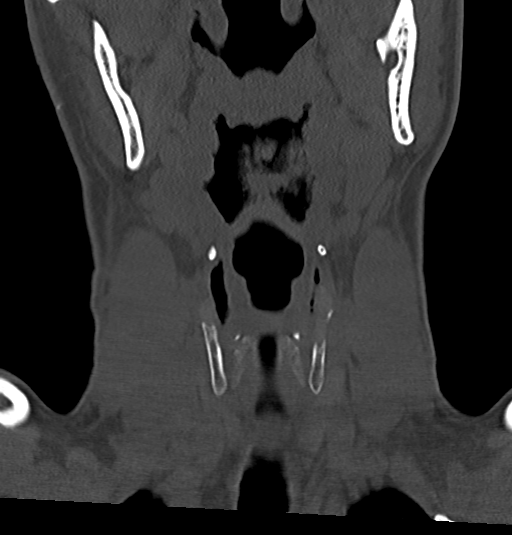
[im 25/61  bone]
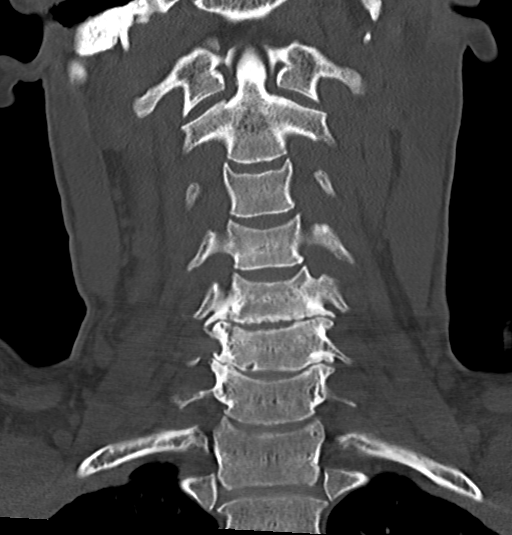
[im 37/61  bone]
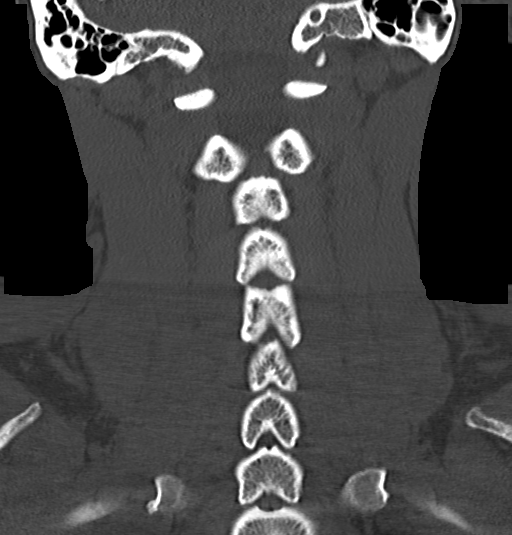

[Series 7: sagittal bone · sagittal · 0.24mm/px · 5 of 61 slices shown, 6 images]
[im 21/61  bone]
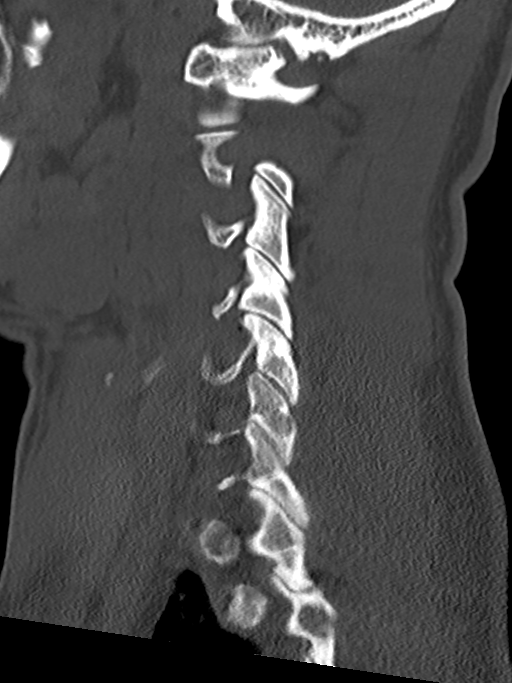
[im 26/61  bone]
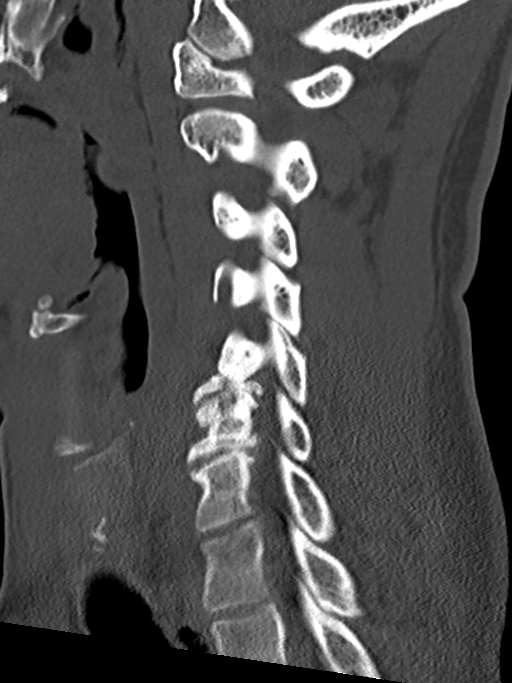
[im 31/61  soft-tissue]
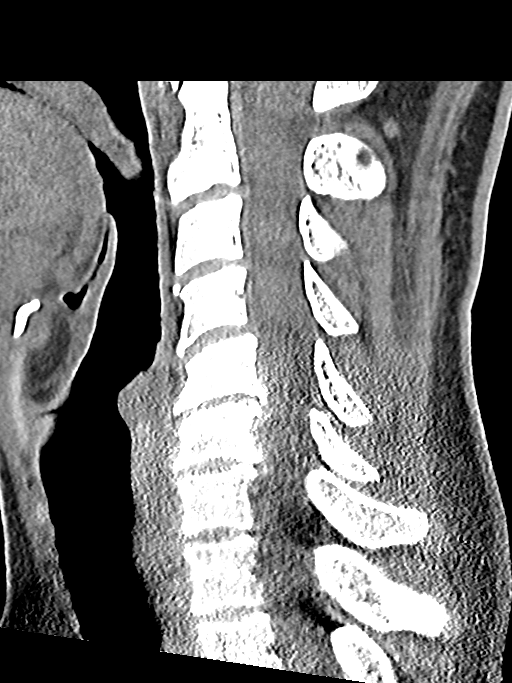
[im 31/61  bone]
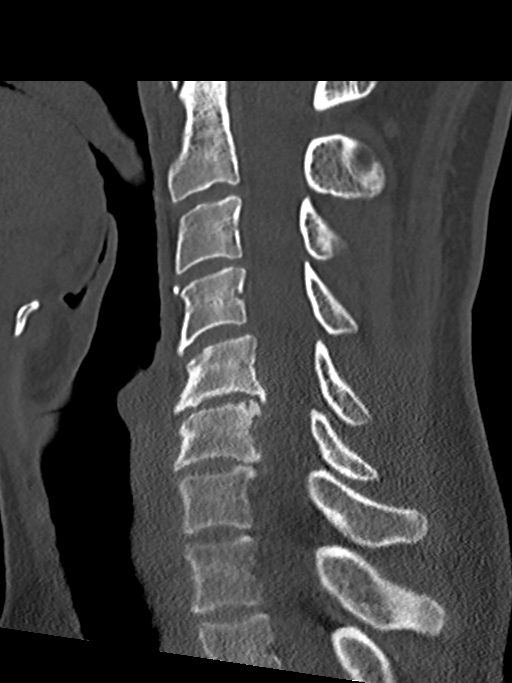
[im 36/61  bone]
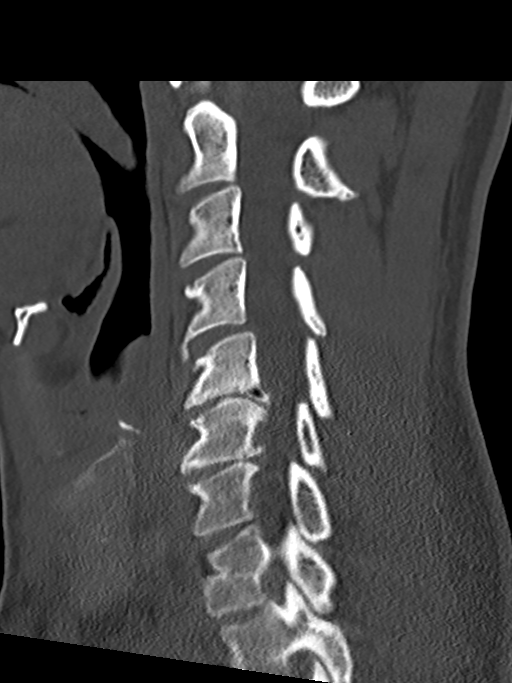
[im 41/61  bone]
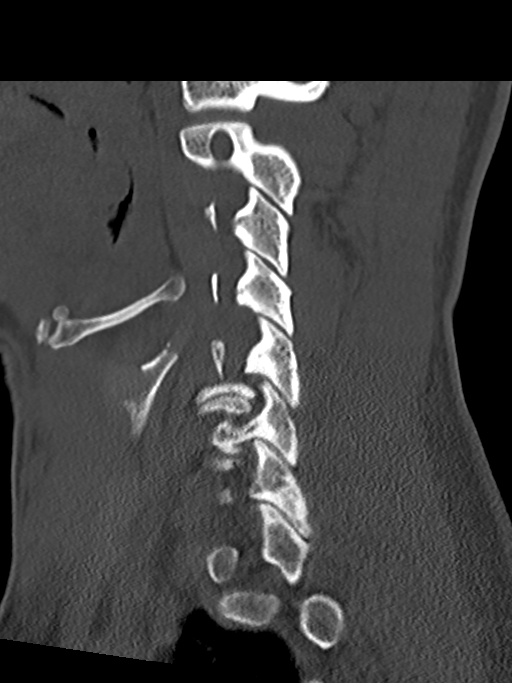

[13 of 35 positions shown; findings below may reference images not displayed]

FINDINGS: CT HEAD FINDINGS

Brain: There is no evidence of an acute infarct, intracranial
hemorrhage, mass, midline shift, or extra-axial fluid collection.
The ventricles and sulci are normal.

Vascular: No hyperdense vessel.

Skull: No fracture or suspicious osseous lesion.

Sinuses/Orbits: Visualized paranasal sinuses and mastoid air cells
are clear. Unremarkable orbits.

Other: None.

CT CERVICAL SPINE FINDINGS

Alignment: Straightening of the normal cervical lordosis. Chronic
trace anterolisthesis of C4 on C5.

Skull base and vertebrae: No acute fracture or suspicious osseous
lesion.

Soft tissues and spinal canal: No prevertebral fluid or swelling. No
visible canal hematoma.

Disc levels: Similar appearance of multilevel disc degeneration,
greatest at C5-6 and C6-7 with uncovertebral spurring resulting in
moderate bilateral neural foraminal stenosis.

Upper chest: Clear lung apices.

Other: None.
IMPRESSION: 1. No evidence of acute intracranial abnormality.
2. No acute cervical spine fracture.

## 2021-01-26 MED ORDER — ONDANSETRON HCL 4 MG/2ML IJ SOLN
4.0000 mg | Freq: Once | INTRAMUSCULAR | Status: AC
  Administered 2021-01-26: 4 mg via INTRAVENOUS
  Filled 2021-01-26: qty 2

## 2021-01-26 MED ORDER — ADULT MULTIVITAMIN W/MINERALS CH
1.0000 | ORAL_TABLET | Freq: Every day | ORAL | Status: DC
  Administered 2021-01-27 – 2021-02-06 (×11): 1 via ORAL
  Filled 2021-01-26 (×12): qty 1

## 2021-01-26 MED ORDER — THIAMINE HCL 100 MG PO TABS: 100.0000 mg | ORAL_TABLET | Freq: Every day | ORAL | Status: DC

## 2021-01-26 MED ORDER — HEPARIN SODIUM (PORCINE) 5000 UNIT/ML IJ SOLN
5000.0000 [IU] | Freq: Three times a day (TID) | INTRAMUSCULAR | Status: DC
  Administered 2021-01-26 – 2021-02-01 (×10): 5000 [IU] via SUBCUTANEOUS
  Filled 2021-01-26 (×12): qty 1

## 2021-01-26 MED ORDER — ONDANSETRON HCL 4 MG PO TABS: 4.0000 mg | ORAL_TABLET | Freq: Four times a day (QID) | ORAL | Status: DC | PRN

## 2021-01-26 MED ORDER — ONDANSETRON HCL 4 MG/2ML IJ SOLN
4.0000 mg | Freq: Four times a day (QID) | INTRAMUSCULAR | Status: DC | PRN
  Administered 2021-01-28: 4 mg via INTRAVENOUS
  Filled 2021-01-26: qty 2

## 2021-01-26 MED ORDER — SODIUM CHLORIDE 0.9 % IV BOLUS
1000.0000 mL | Freq: Once | INTRAVENOUS | Status: AC
  Administered 2021-01-26: 1000 mL via INTRAVENOUS

## 2021-01-26 MED ORDER — ACETAMINOPHEN 650 MG RE SUPP: 650.0000 mg | Freq: Four times a day (QID) | RECTAL | Status: DC | PRN

## 2021-01-26 MED ORDER — LORAZEPAM 2 MG/ML IJ SOLN
1.0000 mg | INTRAMUSCULAR | Status: DC | PRN
  Administered 2021-01-26: 1 mg via INTRAVENOUS
  Filled 2021-01-26: qty 1

## 2021-01-26 MED ORDER — FOLIC ACID 1 MG PO TABS
1.0000 mg | ORAL_TABLET | Freq: Every day | ORAL | Status: DC
  Administered 2021-01-27 – 2021-02-06 (×11): 1 mg via ORAL
  Filled 2021-01-26 (×11): qty 1

## 2021-01-26 MED ORDER — METOPROLOL TARTRATE 5 MG/5ML IV SOLN: 5.0000 mg | Freq: Four times a day (QID) | INTRAVENOUS | Status: DC | PRN

## 2021-01-26 MED ORDER — LACTATED RINGERS IV SOLN: INTRAVENOUS | Status: AC

## 2021-01-26 MED ORDER — LORAZEPAM 1 MG PO TABS: 1.0000 mg | ORAL_TABLET | ORAL | Status: DC | PRN

## 2021-01-26 MED ORDER — THIAMINE HCL 100 MG/ML IJ SOLN: 100.0000 mg | Freq: Every day | INTRAMUSCULAR | Status: DC

## 2021-01-26 MED ORDER — ACETAMINOPHEN 325 MG PO TABS
650.0000 mg | ORAL_TABLET | Freq: Four times a day (QID) | ORAL | Status: DC | PRN
  Administered 2021-01-28 – 2021-02-05 (×4): 650 mg via ORAL
  Filled 2021-01-26 (×4): qty 2

## 2021-01-26 MED ORDER — NICOTINE 21 MG/24HR TD PT24
21.0000 mg | MEDICATED_PATCH | TRANSDERMAL | Status: DC | PRN
  Administered 2021-01-31: 21 mg via TRANSDERMAL
  Filled 2021-01-26: qty 1

## 2021-01-26 NOTE — Subjective & Objective (Signed)
CC: falls HPI: 50 year old African-American male with no prior medical history presents to the ER today via EMS.  Patient's found in the bushes near [place] local [place] station.  Patient states that he tripped on a parking lot cement block and fell into the bushes.  Patient has been drinking heavily.  Patient drinks 10-15 beers per day.  He also uses cocaine and THC on a regular basis.  Per EMS report, patient smelled of alcohol, was slurring his speech.  Patient was initially hypotensive when he was found but improved after getting loaded into the ambulance.  Work-up in the ER consisted of a right lower extremity ultrasound which was negative for DVT.  CBC showed a white count of 22 hemoglobin 16 platelets of 298.  Chemistry showed a serum creatinine of 3.86 and a BUN of 39.  6 months ago patient serum creatinine 0.86 with a BUN of 11.  LFTs were markedly abnormal with an AST of 1140 and ALT of 392.  Total bili was normal at 0.8.  Right upper quadrant ultrasound demonstrated some gallbladder sludge but without gallstones.  CT head was negative for acute intracranial abnormalities.  CT spine negative for fractures.  Right rib x-rays were negative for fractures.  Right ankle x-ray was negative for fractures.  Due to the patient's abnormal LFTs and acute kidney injury, [facility] hospitalist contacted for admission.

## 2021-01-26 NOTE — ED Provider Notes (Signed)
[facility] EMERGENCY DEPARTMENT Provider Note   CSN: [id number] Arrival date & time: 01/26/21  1527     History No chief complaint on file.   Frank [name] is a 50 y.o. male.  The history is provided by the patient and medical records. No language interpreter was used.  Fall This is a recurrent problem. The current episode started less than 1 hour ago. The problem occurs rarely. The problem has not changed since onset.Associated symptoms include chest pain and headaches. Pertinent negatives include no abdominal pain and no shortness of breath. Nothing aggravates the symptoms. Nothing relieves the symptoms. He has tried nothing for the symptoms. The treatment provided no relief.      Past Medical History:  Diagnosis Date   Arthritis    Asthma    Gout    Rib fracture     There are no problems to display for this patient.   No past surgical history on file.     No family history on file.  Social History   Tobacco Use   Smoking status: Every Day    Packs/day: 0.20    Types: Cigarettes  Substance Use Topics   Alcohol use: Yes    Alcohol/week: 1.0 standard drink    Types: 1 Cans of beer per week    Comment: daily   Drug use: Yes    Types: Marijuana, Cocaine    Comment: "crack sprinkled on top of tobacco"     Home Medications Prior to Admission medications   Medication Sig Start Date End Date Taking? Authorizing Provider  albuterol (PROVENTIL HFA;VENTOLIN HFA) 108 (90 BASE) MCG/ACT inhaler Inhale 1-2 puffs into the lungs every 4 (four) hours as needed for wheezing or shortness of breath. 02/05/13   Marisa Severin, MD  albuterol (PROVENTIL) (2.5 MG/3ML) 0.083% nebulizer solution Take 3 mLs (2.5 mg total) by nebulization every 4 (four) hours as needed for wheezing or shortness of breath. 07/11/20   Arby Barrette, MD  albuterol (VENTOLIN HFA) 108 (90 Base) MCG/ACT inhaler Inhale 2 puffs into the lungs every 4 (four) hours as needed for wheezing or  shortness of breath. 07/11/20   Arby Barrette, MD  azithromycin (ZITHROMAX Z-PAK) 250 MG tablet Take 1 tablet (250 mg total) by mouth daily. 2 tablets on the first day.  1 tablet daily for the next 4 days 07/11/20   Arby Barrette, MD  naproxen (NAPROSYN) 500 MG tablet Take 1 tablet (500 mg total) by mouth 2 (two) times daily as needed for moderate pain. 09/03/20   Petrucelli, Pleas Koch, PA-C  predniSONE (DELTASONE) 20 MG tablet 3 tabs po day one, then 2 po daily x 4 days 07/11/20   Arby Barrette, MD    Allergies    Other  Review of Systems   Review of Systems  Constitutional:  Positive for chills and fatigue. Negative for fever.  HENT:  Negative for congestion.   Eyes:  Negative for photophobia and visual disturbance.  Respiratory:  Positive for cough and chest tightness. Negative for shortness of breath and wheezing.   Cardiovascular:  Positive for chest pain. Negative for palpitations and leg swelling.  Gastrointestinal:  Positive for diarrhea and nausea. Negative for abdominal pain, constipation and vomiting.  Genitourinary:  Negative for dysuria and frequency.  Musculoskeletal:  Negative for back pain, neck pain and neck stiffness.  Skin:  Negative for rash and wound.  Neurological:  Positive for light-headedness and headaches. Negative for dizziness and weakness. Syncope: possible. Psychiatric/Behavioral:  Negative  for agitation and confusion.   All other systems reviewed and are negative.  Physical Exam Updated Vital Signs There were no vitals taken for this visit.  Physical Exam Vitals and nursing note reviewed.  Constitutional:      General: He is not in acute distress.    Appearance: He is well-developed. He is not ill-appearing, toxic-appearing or diaphoretic.  HENT:     Head: Normocephalic and atraumatic.     Nose: No congestion or rhinorrhea.     Mouth/Throat:     Mouth: Mucous membranes are dry.     Pharynx: No oropharyngeal exudate or posterior oropharyngeal  erythema.  Eyes:     Extraocular Movements: Extraocular movements intact.     Conjunctiva/sclera: Conjunctivae normal.     Pupils: Pupils are equal, round, and reactive to light.  Cardiovascular:     Rate and Rhythm: Normal rate and regular rhythm.     Heart sounds: No murmur heard. Pulmonary:     Effort: Pulmonary effort is normal. No respiratory distress.     Breath sounds: Normal breath sounds. No wheezing, rhonchi or rales.  Chest:     Chest wall: Tenderness present.  Abdominal:     General: Abdomen is flat.     Palpations: Abdomen is soft.     Tenderness: There is no abdominal tenderness. There is no right CVA tenderness, left CVA tenderness, guarding or rebound.  Musculoskeletal:        General: Tenderness present.     Cervical back: Neck supple. No tenderness.     Right lower leg: No edema.     Left lower leg: No edema.  Skin:    General: Skin is warm and dry.     Capillary Refill: Capillary refill takes less than 2 seconds.     Findings: Bruising present. No erythema.  Neurological:     General: No focal deficit present.     Mental Status: He is alert.     Sensory: No sensory deficit.     Motor: No weakness.  Psychiatric:        Mood and Affect: Mood normal.    ED Results / Procedures / Treatments   Labs (all labs ordered are listed, but only abnormal results are displayed) Labs Reviewed  CBC WITH DIFFERENTIAL/PLATELET - Abnormal; Notable for the following components:      Result Value   WBC 22.0 (*)    MCH 34.4 (*)    Neutro Abs 19.5 (*)    Lymphs Abs 0.5 (*)    Monocytes Absolute 1.8 (*)    Abs Immature Granulocytes 0.19 (*)    All other components within normal limits  COMPREHENSIVE METABOLIC PANEL - Abnormal; Notable for the following components:   Chloride 96 (*)    CO2 20 (*)    Glucose, Bld 136 (*)    BUN 39 (*)    Creatinine, Ser 3.86 (*)    Calcium 8.0 (*)    AST 1,140 (*)    ALT 392 (*)    GFR, Estimated 18 (*)    Anion gap 20 (*)    All  other components within normal limits  AMMONIA - Abnormal; Notable for the following components:   Ammonia 56 (*)    All other components within normal limits  LACTIC ACID, PLASMA - Abnormal; Notable for the following components:   Lactic Acid, Venous 2.3 (*)    All other components within normal limits  TROPONIN I (HIGH SENSITIVITY) - Abnormal; Notable for the following components:  Troponin I (High Sensitivity) 45 (*)    All other components within normal limits  TROPONIN I (HIGH SENSITIVITY) - Abnormal; Notable for the following components:   Troponin I (High Sensitivity) 47 (*)    All other components within normal limits  TROPONIN I (HIGH SENSITIVITY) - Abnormal; Notable for the following components:   Troponin I (High Sensitivity) 39 (*)    All other components within normal limits  RESP PANEL BY RT-PCR (FLU A&B, COVID) [id number]  URINE CULTURE  GASTROINTESTINAL PANEL BY PCR, STOOL (REPLACES STOOL CULTURE)  ETHANOL  TSH  LACTIC ACID, PLASMA  PROTIME-INR  HIV ANTIBODY (ROUTINE TESTING W REFLEX)  RAPID URINE DRUG SCREEN, HOSP PERFORMED  URINALYSIS, ROUTINE W REFLEX MICROSCOPIC  HEPATITIS PANEL, ACUTE  MAGNESIUM  CBC WITH DIFFERENTIAL/PLATELET  COMPREHENSIVE METABOLIC PANEL    EKG EKG Interpretation  Date/Time:  Tuesday January 26 2021 15:33:25 EDT Ventricular Rate:  79 PR Interval:  133 QRS Duration: 96 QT Interval:  408 QTC Calculation: 468 R Axis:   77 Text Interpretation: Sinus rhythm Right atrial enlargement Anteroseptal infarct, age indeterminate ST elevation, consider inferior injury When compared to prior, similar appearance. No STEMI Confirmed by Theda Belfast ([id number]) on 01/26/2021 4:12:20 PM  Radiology DG Ribs Unilateral W/Chest Right  Result Date: 01/26/2021 CLINICAL DATA:  Status post fall. EXAM: RIGHT RIBS AND CHEST - 3+ VIEW COMPARISON:  None. FINDINGS: No acute displaced right rib fracture or other bone lesions are seen involving the ribs. The heart and  mediastinal contours are within normal limits. No focal consolidation. No pulmonary edema. No pleural effusion. No pneumothorax. No acute osseous abnormality. IMPRESSION: 1. No acute displaced right rib fracture. Please note, nondisplaced rib fractures may be occult on radiograph. 2. No acute cardiopulmonary abnormality. Electronically Signed   By: Tish Frederickson M.D.   On: 01/26/2021 17:15   DG Ankle Complete Left  Result Date: 01/26/2021 CLINICAL DATA:  Chills and cough.  Status post fall. EXAM: LEFT ANKLE COMPLETE - 3+ VIEW COMPARISON:  None. FINDINGS: There is no evidence of fracture, dislocation, or joint effusion. There is no evidence of arthropathy or other focal bone abnormality. Soft tissues are unremarkable. IMPRESSION: Negative. Electronically Signed   By: Signa Kell M.D.   On: 01/26/2021 17:10   DG Ankle Complete Right  Result Date: 01/26/2021 CLINICAL DATA:  Chills and cough, fall. EXAM: RIGHT ANKLE - COMPLETE 3+ VIEW COMPARISON:  None. FINDINGS: There is no evidence of fracture, dislocation, or joint effusion. There is no evidence of arthropathy or other focal bone abnormality. Soft tissues are unremarkable. IMPRESSION: Negative. Electronically Signed   By: Darliss Cheney M.D.   On: 01/26/2021 17:09   CT HEAD WO CONTRAST ( )  Result Date: 01/26/2021 CLINICAL DATA:  Head trauma, mod-severe; Neck trauma, intoxicated or obtunded (Age >= 16y). EtOH on board, syncope/fall and hit head. EXAM: CT HEAD WITHOUT CONTRAST CT CERVICAL SPINE WITHOUT CONTRAST TECHNIQUE: Multidetector CT imaging of the head and cervical spine was performed following the standard protocol without intravenous contrast. Multiplanar CT image reconstructions of the cervical spine were also generated. COMPARISON:  09/03/2020 FINDINGS: CT HEAD FINDINGS Brain: There is no evidence of an acute infarct, intracranial hemorrhage, mass, midline shift, or extra-axial fluid collection. The ventricles and sulci are normal.  Vascular: No hyperdense vessel. Skull: No fracture or suspicious osseous lesion. Sinuses/Orbits: Visualized paranasal sinuses and mastoid air cells are clear. Unremarkable orbits. Other: None. CT CERVICAL SPINE FINDINGS Alignment: Straightening of the normal cervical lordosis. Chronic trace anterolisthesis of  C4 on C5. Skull base and vertebrae: No acute fracture or suspicious osseous lesion. Soft tissues and spinal canal: No prevertebral fluid or swelling. No visible canal hematoma. Disc levels: Similar appearance of multilevel disc degeneration, greatest at C5-6 and C6-7 with uncovertebral spurring resulting in moderate bilateral neural foraminal stenosis. Upper chest: Clear lung apices. Other: None. IMPRESSION: 1. No evidence of acute intracranial abnormality. 2. No acute cervical spine fracture. Electronically Signed   By: Sebastian Ache M.D.   On: 01/26/2021 17:50   CT Cervical Spine Wo Contrast  Result Date: 01/26/2021 CLINICAL DATA:  Head trauma, mod-severe; Neck trauma, intoxicated or obtunded (Age >= 16y). EtOH on board, syncope/fall and hit head. EXAM: CT HEAD WITHOUT CONTRAST CT CERVICAL SPINE WITHOUT CONTRAST TECHNIQUE: Multidetector CT imaging of the head and cervical spine was performed following the standard protocol without intravenous contrast. Multiplanar CT image reconstructions of the cervical spine were also generated. COMPARISON:  09/03/2020 FINDINGS: CT HEAD FINDINGS Brain: There is no evidence of an acute infarct, intracranial hemorrhage, mass, midline shift, or extra-axial fluid collection. The ventricles and sulci are normal. Vascular: No hyperdense vessel. Skull: No fracture or suspicious osseous lesion. Sinuses/Orbits: Visualized paranasal sinuses and mastoid air cells are clear. Unremarkable orbits. Other: None. CT CERVICAL SPINE FINDINGS Alignment: Straightening of the normal cervical lordosis. Chronic trace anterolisthesis of C4 on C5. Skull base and vertebrae: No acute fracture or  suspicious osseous lesion. Soft tissues and spinal canal: No prevertebral fluid or swelling. No visible canal hematoma. Disc levels: Similar appearance of multilevel disc degeneration, greatest at C5-6 and C6-7 with uncovertebral spurring resulting in moderate bilateral neural foraminal stenosis. Upper chest: Clear lung apices. Other: None. IMPRESSION: 1. No evidence of acute intracranial abnormality. 2. No acute cervical spine fracture. Electronically Signed   By: Sebastian Ache M.D.   On: 01/26/2021 17:50   US RENAL  Result Date: 01/26/2021 CLINICAL DATA:  Acute kidney injury EXAM: RENAL / URINARY TRACT ULTRASOUND COMPLETE COMPARISON:  None. FINDINGS: Right Kidney: Renal measurements: 10.4 x 4.6 x 4.2 cm = volume: 105.3 mL. Cortex appears echogenic. No mass or hydronephrosis. Left Kidney: Renal measurements: 8.8 x 5.1 x 3.5 cm = volume: 81.3 mL. Cortex appears slightly echogenic. No mass or hydronephrosis Bladder: Debris in the urinary bladder Other: None. IMPRESSION: 1. Kidneys appear slightly echogenic suggesting medical renal disease. No hydronephrosis 2. Debris in the urinary bladder Electronically Signed   By: Jasmine Pang M.D.   On: 01/26/2021 23:30   DG Hip Unilat W or Wo Pelvis 2-3 Views Right  Result Date: 01/26/2021 CLINICAL DATA:  Status post fall. EXAM: DG HIP (WITH OR WITHOUT PELVIS) 2-3V RIGHT COMPARISON:  01/29/2009 FINDINGS: There is no evidence of hip fracture or dislocation. There is no evidence of arthropathy or other focal bone abnormality. IMPRESSION: Negative. Electronically Signed   By: Signa Kell M.D.   On: 01/26/2021 17:08   VAS [place] LOWER EXTREMITY VENOUS (DVT)  Result Date: 01/26/2021  Lower Venous DVT Study Patient Name:  Frank [name]  Date of Exam:   01/26/2021 Medical Rec #: [id number]        Accession #:    [id number] Date of Birth: Oct 21, 1970        Patient Gender: M Patient Age:   32 years Exam Location:  [facility] Procedure:      VAS [place] LOWER EXTREMITY  VENOUS (DVT) Referring Phys: Lynden Oxford --------------------------------------------------------------------------------  Indications: Pain s/p fall.  Comparison Study: No prior studies. Performing Technologist: Fleet Contras  Hodge RDMS, RVT  Examination Guidelines: A complete evaluation includes B-mode imaging, spectral Doppler, color Doppler, and power Doppler as needed of all accessible portions of each vessel. Bilateral testing is considered an integral part of a complete examination. Limited examinations for reoccurring indications may be performed as noted. The reflux portion of the exam is performed with the patient in reverse Trendelenburg.  +---------+---------------+---------+-----------+----------+--------------+ RIGHT    CompressibilityPhasicitySpontaneityPropertiesThrombus Aging +---------+---------------+---------+-----------+----------+--------------+ CFV      Full           Yes      Yes                                 +---------+---------------+---------+-----------+----------+--------------+ SFJ      Full                                                        +---------+---------------+---------+-----------+----------+--------------+ FV Prox  Full                                                        +---------+---------------+---------+-----------+----------+--------------+ FV Mid   Full                                                        +---------+---------------+---------+-----------+----------+--------------+ FV DistalFull                                                        +---------+---------------+---------+-----------+----------+--------------+ PFV      Full                                                        +---------+---------------+---------+-----------+----------+--------------+ POP      Full           Yes      Yes                                  +---------+---------------+---------+-----------+----------+--------------+ PTV      Full                                                        +---------+---------------+---------+-----------+----------+--------------+ PERO     Full                                                        +---------+---------------+---------+-----------+----------+--------------+  Gastroc  Full                                                        +---------+---------------+---------+-----------+----------+--------------+     Summary: RIGHT: - There is no evidence of deep vein thrombosis in the lower extremity.  - No cystic structure found in the popliteal fossa.   *See table(s) above for measurements and observations.    Preliminary    US Abdomen Limited RUQ (LIVER/GB)  Result Date: 01/26/2021 CLINICAL DATA:  Right upper quadrant pain. EXAM: ULTRASOUND ABDOMEN LIMITED RIGHT UPPER QUADRANT COMPARISON:  CT chest abdomen and pelvis 04/04/2009 report only. FINDINGS: Gallbladder: No gallstones or wall thickening visualized. Gallbladder sludge is present. No sonographic Murphy sign noted by sonographer. Common bile duct: Diameter: 3.7 mm. Liver: No focal lesion identified. Within normal limits in parenchymal echogenicity. Portal vein is patent on color Doppler imaging with normal direction of blood flow towards the liver. Other: None. IMPRESSION: 1. Gallbladder sludge. No additional sonographic evidence for cholelithiasis or acute cholecystitis. Electronically Signed   By: Darliss Cheney M.D.   On: 01/26/2021 18:55    Procedures Procedures   Medications Ordered in ED Medications  lactated ringers infusion ( Intravenous New Bag/Given 01/26/21 2235)  LORazepam (ATIVAN) tablet 1-4 mg ( Oral See Alternative 01/26/21 2221)    Or  LORazepam (ATIVAN) injection 1-4 mg (1 mg Intravenous Given 01/26/21 2221)  thiamine tablet 100 mg (has no administration in time range)    Or  thiamine (B-1) injection 100 mg (has  no administration in time range)  folic acid (FOLVITE) tablet 1 mg (has no administration in time range)  multivitamin with minerals tablet 1 tablet (has no administration in time range)  heparin injection 5,000 Units (5,000 Units Subcutaneous Given 01/26/21 2334)  ondansetron (ZOFRAN) tablet 4 mg (has no administration in time range)    Or  ondansetron (ZOFRAN) injection 4 mg (has no administration in time range)  acetaminophen (TYLENOL) tablet 650 mg (has no administration in time range)    Or  acetaminophen (TYLENOL) suppository 650 mg (has no administration in time range)  nicotine (NICODERM CQ - dosed in mg/24 hours) patch 21 mg (has no administration in time range)  metoprolol tartrate (LOPRESSOR) injection 5 mg (has no administration in time range)  sodium chloride 0.9 % bolus 1,000 mL (0 mLs Intravenous Stopped 01/26/21 1705)  sodium chloride 0.9 % bolus 1,000 mL (0 mLs Intravenous Stopped 01/26/21 2311)  ondansetron (ZOFRAN) injection 4 mg (4 mg Intravenous Given 01/26/21 2220)    ED Course  I have reviewed the triage vital signs and the nursing notes.  Pertinent labs & imaging results that were available during my care of the patient were reviewed by me and considered in my medical decision making (see chart for details).    MDM Rules/Calculators/A&P                           AMED DATTA is a 50 y.o. male with a past medical history significant for asthma, gout, previous rib fracture, and EtOH use who presents via EMS for a fall.  According to EMS, patient was witnessed falling into some bushes and he does not member if he passed out or not leading to the fall.  He does report drinking "  a couple 40s" prior to falling today.  He reports he is having some pain in his head and EMS noted him to have some headache and initially was groggy and slurring his speech.  EMS suspected was more EtOH than stroke or head injury based on their exam initially.  Patient says that he is having  some pain in his right lateral chest wall, his right hip, and his bilateral ankles.  He also says that when he woke up this morning he was having some discomfort in his right calf.  He denies history of blood clots in his legs or lungs.  Denies any other trauma.  He reports no nausea, vomiting, constipation, diarrhea.  Denies any urinary changes.  He reports that he has had "some chills" as well as a nonproductive cough but denies any known COVID contacts.  He is denying any vision changes and denies any blood thinner use.  EMS, patient was slightly hypotensive in route with a blood pressure of 80/60 but then upon arrival is normotensive.  Otherwise he was afebrile and had normal heart rate.  On exam, lungs are clear but right lateral chest is tender to palpation.  Abdomen was nontender he did have some right hip tenderness.  Ankles are tender bilaterally and he reports his right calf was tender.  He otherwise has intact strength and sensation in all extremities and had clear speech for me.  Pupils are symmetric and reactive normal extract movements.  He has some tenderness to his right temple with some bruising and otherwise did not have any neck tenderness.  Clinically I do feel need to rule out some traumatic injuries due to his fall with likely EtOH on board.  We will check an EtOH as well screening labs will get CT of the head and neck.  We will get chest x-ray for the right lateral chest pain as well as get imaging of his right hip and his bilateral ankles.  Due to the calf pain he reported before any of this, we will get a DVT ultrasound of the right leg to rule out clot.  Patient appears dry, will give some fluids.  Anticipate reassessment after fluids and work-up for this likely fall in the setting of intoxication this afternoon.  8:39 PM Patient's work-up began to return.  Troponin elevated but appears similarly elevated.  Most concerning is the metabolic panel showing acute kidney injury with  creatinine of 3.86 which most recently was 0.8 sick several months ago.  The LFTs are also very elevated with AST of 1140 and ALT of 392.  Bilirubin is normal.  White blood cell count is elevated at 22.  Given the right torso tenderness and pain with these findings, a right upper quadrant ultrasound was obtained and did not show evidence of acute cholecystitis.  Suspect symptoms related to dehydration and his reported EtOH consumption.  EtOH was undetectable however here.  COVID and flu negative.  Chest x-ray did not show rib fractures.  Ammonia slightly elevated and lactic acid was initially elevated but downtrending.  Ultrasound the leg did not show blood clot and CT of the head and neck were reassuring.  Clinically I am concerned about the patient who is still having nausea, right-sided discomfort with elevated liver function and kidney function.  As he was slightly hypotensive in route and may have had a syncopal episode today, do not feel he safe for discharge home.  Will give more fluids and call for admission.  Final Clinical Impression(s) /  ED Diagnoses Final diagnoses:  RUQ abdominal pain  AKI (acute kidney injury) (HCC)    Clinical Impression: 1. RUQ abdominal pain   2. AKI (acute kidney injury) (HCC)     Disposition: Admit  This note was prepared with assistance of Dragon voice recognition software. Occasional wrong-word or sound-a-like substitutions may have occurred due to the inherent limitations of voice recognition software.     Gwenn Teodoro, Canary Brim, MD 01/27/21 [phone]

## 2021-01-26 NOTE — Assessment & Plan Note (Signed)
Pt admits to [facility] and cocaine use. UDS pending.

## 2021-01-26 NOTE — Assessment & Plan Note (Signed)
3-4 diarrheal stools per day for several days. Checking acute hepatitis panel and HIV. Check GI panel.

## 2021-01-26 NOTE — Progress Notes (Signed)
Lower extremity venous right study completed.  Preliminary results relayed to Tegeler, MD.  See CV Proc for preliminary results report.   Abygail Galeno, RDMS, RVT  
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
## 2021-01-26 NOTE — Assessment & Plan Note (Signed)
Likely due to alcohol abuse. However, given pt's recent diarrhea, infectious etiologies are not ruled out. Check acute hepatitis panel. Check HIV. RUQ negative for cholecystitis.

## 2021-01-26 NOTE — Assessment & Plan Note (Signed)
Pt drinks at least 10-15 beers per day. Pt drinks before going to work each day. At high risk for alcohol withdrawal. Start CIWA protocol.

## 2021-01-26 NOTE — Assessment & Plan Note (Signed)
Admit to med/surg bed. Continue with IVF. Avoid nephrotoxic drugs. Check renal U/S.

## 2021-01-27 ENCOUNTER — Observation Stay (HOSPITAL_COMMUNITY): Payer: Self-pay

## 2021-01-27 ENCOUNTER — Inpatient Hospital Stay (HOSPITAL_COMMUNITY): Payer: Self-pay

## 2021-01-27 LAB — CBC WITH DIFFERENTIAL/PLATELET
Abs Immature Granulocytes: 0.08 10*3/uL — ABNORMAL HIGH (ref 0.00–0.07)
Basophils Absolute: 0 10*3/uL (ref 0.0–0.1)
Basophils Relative: 0 %
Eosinophils Absolute: 0 10*3/uL (ref 0.0–0.5)
Eosinophils Relative: 0 %
HCT: 33.4 % — ABNORMAL LOW (ref 39.0–52.0)
Hemoglobin: 11.8 g/dL — ABNORMAL LOW (ref 13.0–17.0)
Immature Granulocytes: 1 %
Lymphocytes Relative: 6 %
Lymphs Abs: 0.9 10*3/uL (ref 0.7–4.0)
MCH: 34.4 pg — ABNORMAL HIGH (ref 26.0–34.0)
MCHC: 35.3 g/dL (ref 30.0–36.0)
MCV: 97.4 fL (ref 80.0–100.0)
Monocytes Absolute: 1.2 10*3/uL — ABNORMAL HIGH (ref 0.1–1.0)
Monocytes Relative: 8 %
Neutro Abs: 12.2 10*3/uL — ABNORMAL HIGH (ref 1.7–7.7)
Neutrophils Relative %: 85 %
Platelets: 210 10*3/uL (ref 150–400)
RBC: 3.43 MIL/uL — ABNORMAL LOW (ref 4.22–5.81)
RDW: 12.9 % (ref 11.5–15.5)
WBC: 14.4 10*3/uL — ABNORMAL HIGH (ref 4.0–10.5)
nRBC: 0 % (ref 0.0–0.2)

## 2021-01-27 LAB — COMPREHENSIVE METABOLIC PANEL
ALT: 373 U/L — ABNORMAL HIGH (ref 0–44)
AST: 1001 U/L — ABNORMAL HIGH (ref 15–41)
Albumin: 2.9 g/dL — ABNORMAL LOW (ref 3.5–5.0)
Alkaline Phosphatase: 54 U/L (ref 38–126)
Anion gap: 12 (ref 5–15)
BUN: 61 mg/dL — ABNORMAL HIGH (ref 6–20)
CO2: 18 mmol/L — ABNORMAL LOW (ref 22–32)
Calcium: 6.4 mg/dL — CL (ref 8.9–10.3)
Chloride: 99 mmol/L (ref 98–111)
Creatinine, Ser: 5.6 mg/dL — ABNORMAL HIGH (ref 0.61–1.24)
GFR, Estimated: 11 mL/min — ABNORMAL LOW (ref >60)
Glucose, Bld: 127 mg/dL — ABNORMAL HIGH (ref 70–99)
Potassium: 4.5 mmol/L (ref 3.5–5.1)
Sodium: 129 mmol/L — ABNORMAL LOW (ref 135–145)
Total Bilirubin: 0.6 mg/dL (ref 0.3–1.2)
Total Protein: 5.4 g/dL — ABNORMAL LOW (ref 6.5–8.1)

## 2021-01-27 LAB — URINALYSIS, COMPLETE (UACMP) WITH MICROSCOPIC
Bilirubin Urine: NEGATIVE
Glucose, UA: 50 mg/dL — AB
Ketones, ur: NEGATIVE mg/dL
Leukocytes,Ua: NEGATIVE
Nitrite: NEGATIVE
Protein, ur: 100 mg/dL — AB
Specific Gravity, Urine: 1.016 (ref 1.005–1.030)
pH: 5 (ref 5.0–8.0)

## 2021-01-27 LAB — CK: Total CK: >50000 U/L — ABNORMAL HIGH (ref 49–397)

## 2021-01-27 LAB — HIV ANTIBODY (ROUTINE TESTING W REFLEX): HIV Screen 4th Generation wRfx: NONREACTIVE

## 2021-01-27 LAB — MAGNESIUM: Magnesium: 2 mg/dL (ref 1.7–2.4)
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
## 2021-01-27 IMAGING — CT CT ABDOMEN W/O CM
2 of 4 series · 14 of 46 positions shown, 16 images · non-contrast
Comparison: X-ray chest [DATE], CT pelvis [DATE]
COMPARISON: X-ray chest [DATE], CT pelvis [DATE]

Addendum:
CLINICAL DATA: Hemoperitoneum. stumble at gas station and fall into
bushes,

EXAM:
CT ABDOMEN AND PELVS WITHOUT CONTRAST
TECHNIQUE: Multidetector CT imaging of the abdomen was performed following the
standard protocol without IV contrast.

[Series 3: abd/ pelvis 5.0 i30f 2 · axial · 0.73mm/px · z∈[+891,+1271]mm · 11 of 90 slices shown, 13 images]
[im 7/90  soft-tissue]
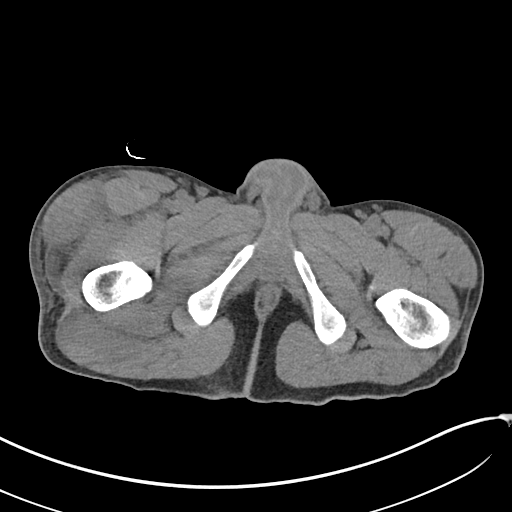
[im 7/90  bone]
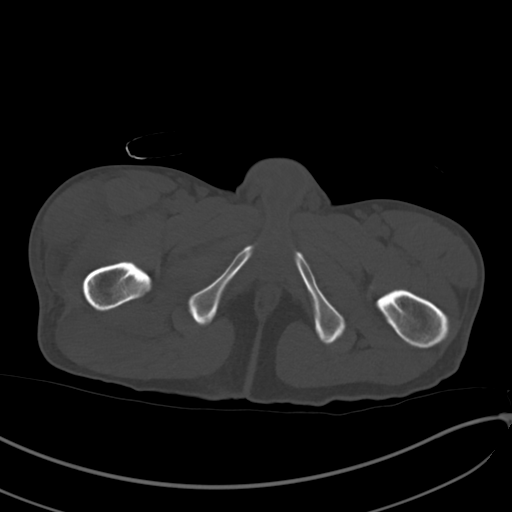
[im 14/90  soft-tissue]
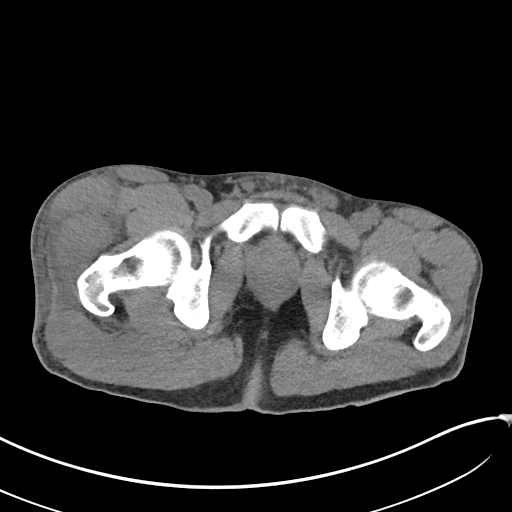
[im 21/90  soft-tissue]
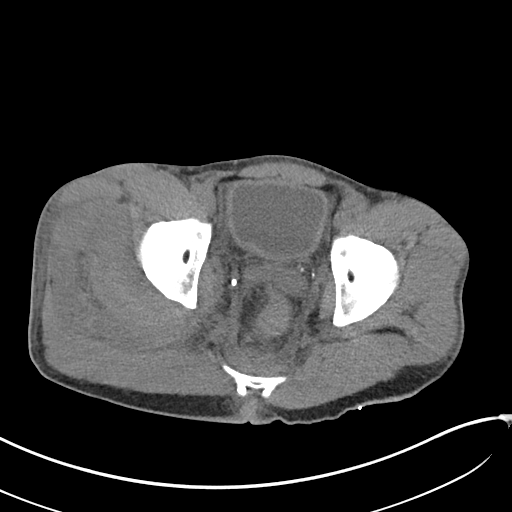
[im 28/90  soft-tissue]
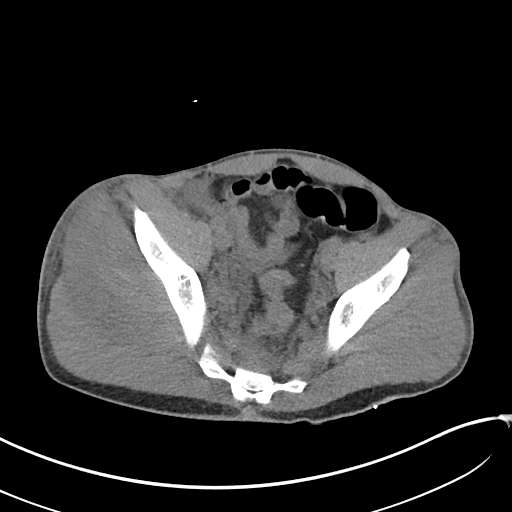
[im 38/90  soft-tissue]
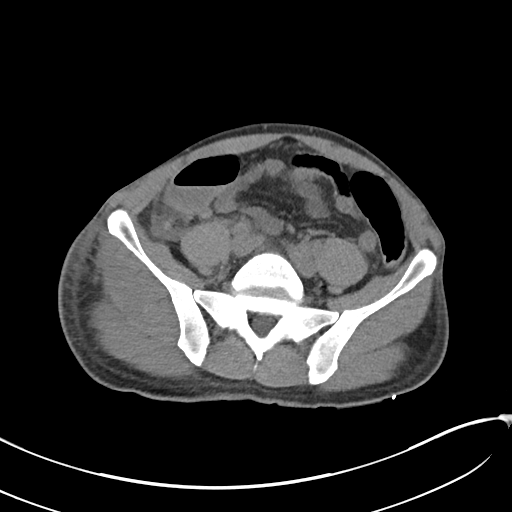
[im 45/90  soft-tissue]
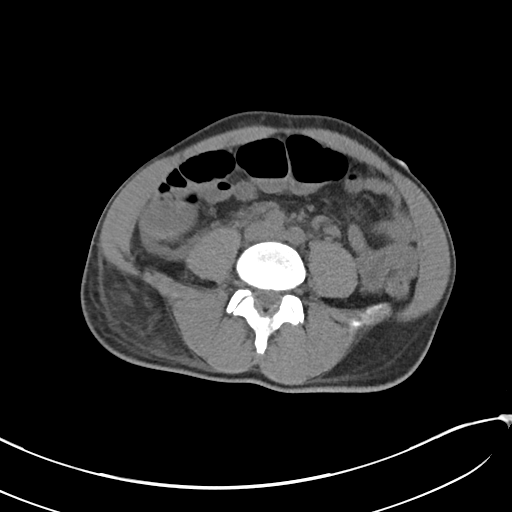
[im 52/90  soft-tissue]
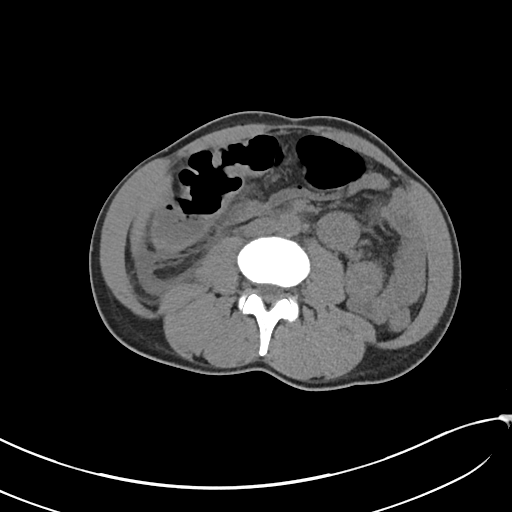
[im 62/90  soft-tissue]
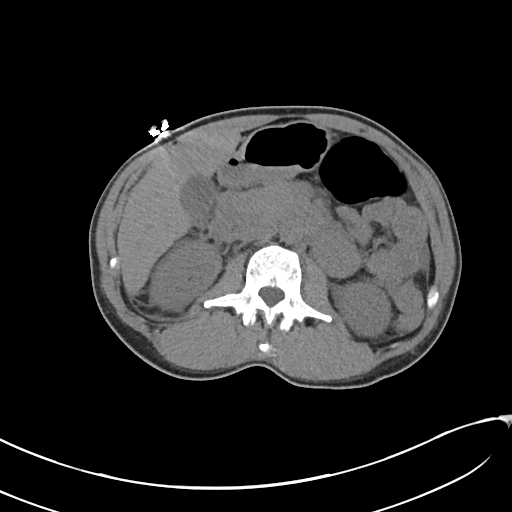
[im 69/90  soft-tissue]
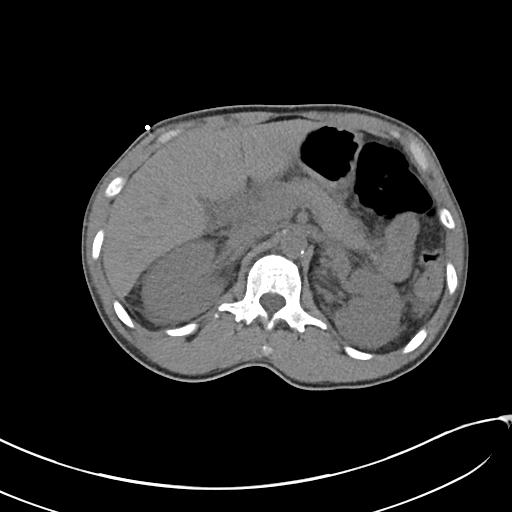
[im 69/90  bone]
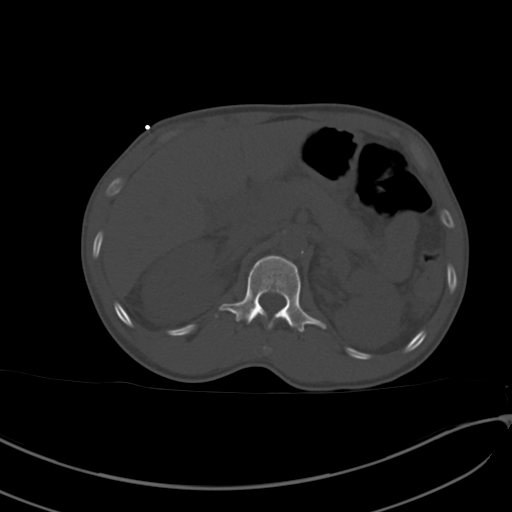
[im 76/90  soft-tissue]
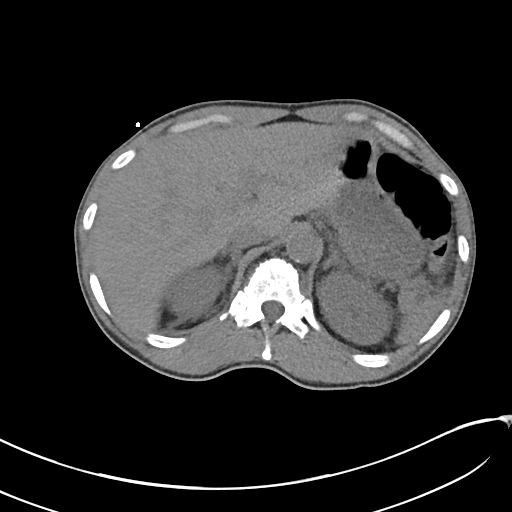
[im 83/90  soft-tissue]
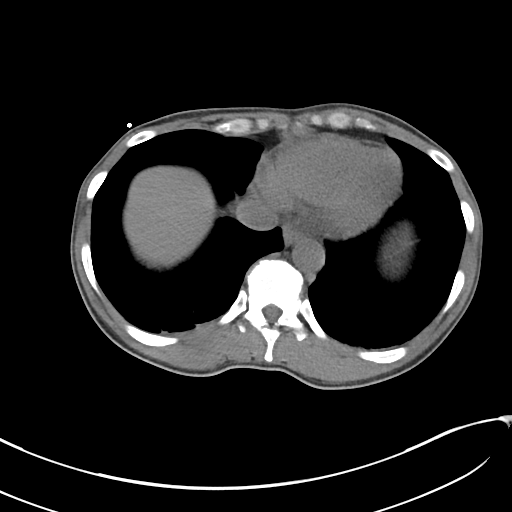

[Series 6: cor st · coronal · 0.76mm/px · 3 of 82 slices shown]
[im 28/82  soft-tissue]
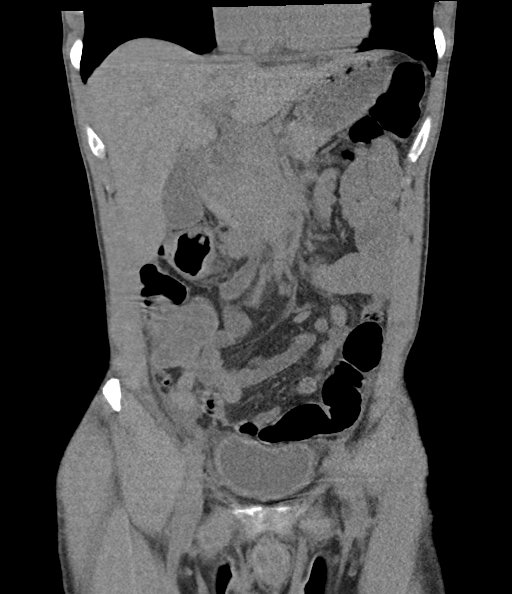
[im 37/82  soft-tissue]
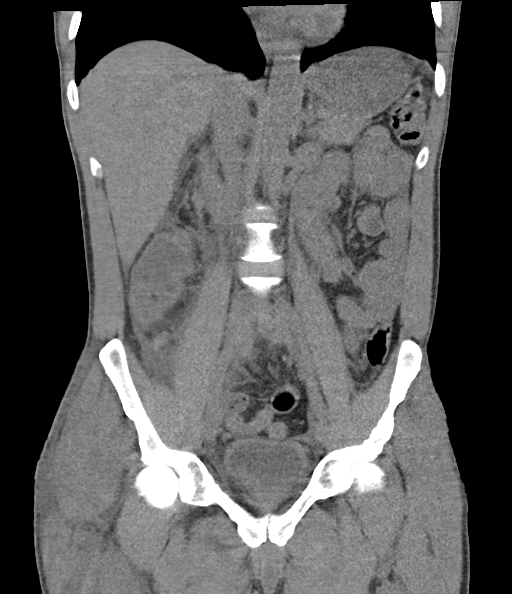
[im 46/82  soft-tissue]
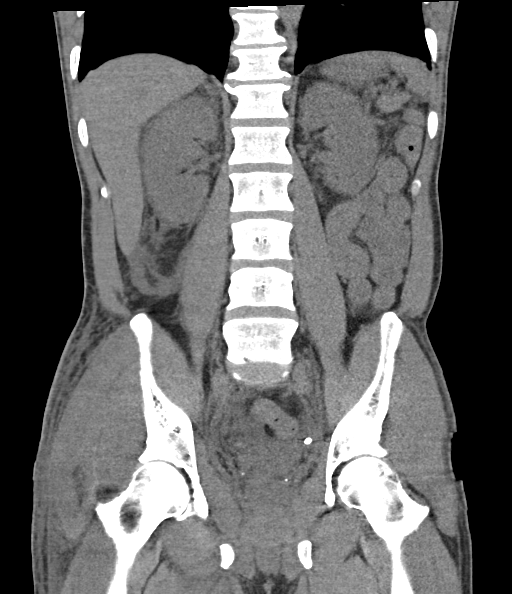

[14 of 46 positions shown; findings below may reference images not displayed]

FINDINGS: Lower abdomen: No acute abnormality.

Liver: Not enlarged. No focal lesion.

Biliary System: The gallbladder is otherwise unremarkable with no
radio-opaque gallstones. No biliary ductal dilatation.

Pancreas: Normal pancreatic contour. No main pancreatic duct
dilatation.

Spleen: Not enlarged. No focal lesion.

Adrenal Glands: No nodularity bilaterally.

Kidneys:

Marked perinephric stranding surrounding the right kidney with free
fluid coursing inferiorly along the right pericolic gutter. No
hydroureteronephrosis. No nephroureterolithiasis. No contour
deforming renal mass.

The urinary bladder is unremarkable.

Bowel: No small or large bowel wall thickening or dilatation. The
appendix is unremarkable.

Mesentery, Omentum, and Peritoneum: High density trace to small
volume abdominal and pelvic free fluid. Fat stranding noted within
the left upper quadrant and surrounding the left kidney. No
pneumoperitoneum. No mesenteric hematoma identified. No organized
fluid collection.

Pelvic Organs: Normal.

Lymph Nodes: No abdominal, pelvic, inguinal lymphadenopathy.

Vasculature: Atherosclerotic plaque. No abdominal aorta or iliac
aneurysm.

Musculoskeletal:

Markedly asymmetric, hypodense, and heterogeneous right gluteal
musculature compared to the left. Associated overlying subcutaneus
soft tissue edema.

Tiny fat containing umbilical hernia.

No suspicious lytic or blastic osseous lesions. No acute displaced
fracture. Multilevel degenerative changes of the spine.
IMPRESSION: 1. Marked perinephric stranding surrounding the right kidney with at
least small volume hemorrhage within the retroperitoneum. Findings
concerning for trauma. Superimposed infection not excluded.
Correlate with urinalysis.
2. Markedly asymmetric, hypodense, and heterogeneous right gluteal
musculature compared to the left. Findings suggestive of an
underlying hematoma formation. Mass is not excluded.
3. Fat stranding noted within the left upper quadrant and
surrounding the left kidney.
4.  Aortic Atherosclerosis ([SW]-[SW]).
5. Markedly limited evaluation on this noncontrast study. Recommend
repeat trauma protocol with intravenous contrast (including a
delayed phase of the abdomen and pelvis).

ADDENDUM:
These results were called by telephone at the time of interpretation
on [DATE] at [DATE] to provider Dr. ZEINULLA, who verbally
acknowledged these results.

*** End of Addendum ***
FINDINGS: Lower abdomen: No acute abnormality.

Liver: Not enlarged. No focal lesion.

Biliary System: The gallbladder is otherwise unremarkable with no
radio-opaque gallstones. No biliary ductal dilatation.

Pancreas: Normal pancreatic contour. No main pancreatic duct
dilatation.

Spleen: Not enlarged. No focal lesion.

Adrenal Glands: No nodularity bilaterally.

Kidneys:

Marked perinephric stranding surrounding the right kidney with free
fluid coursing inferiorly along the right pericolic gutter. No
hydroureteronephrosis. No nephroureterolithiasis. No contour
deforming renal mass.

The urinary bladder is unremarkable.

Bowel: No small or large bowel wall thickening or dilatation. The
appendix is unremarkable.

Mesentery, Omentum, and Peritoneum: High density trace to small
volume abdominal and pelvic free fluid. Fat stranding noted within
the left upper quadrant and surrounding the left kidney. No
pneumoperitoneum. No mesenteric hematoma identified. No organized
fluid collection.

Pelvic Organs: Normal.

Lymph Nodes: No abdominal, pelvic, inguinal lymphadenopathy.

Vasculature: Atherosclerotic plaque. No abdominal aorta or iliac
aneurysm.

Musculoskeletal:

Markedly asymmetric, hypodense, and heterogeneous right gluteal
musculature compared to the left. Associated overlying subcutaneus
soft tissue edema.

Tiny fat containing umbilical hernia.

No suspicious lytic or blastic osseous lesions. No acute displaced
fracture. Multilevel degenerative changes of the spine.
IMPRESSION: 1. Marked perinephric stranding surrounding the right kidney with at
least small volume hemorrhage within the retroperitoneum. Findings
concerning for trauma. Superimposed infection not excluded.
Correlate with urinalysis.
2. Markedly asymmetric, hypodense, and heterogeneous right gluteal
musculature compared to the left. Findings suggestive of an
underlying hematoma formation. Mass is not excluded.
3. Fat stranding noted within the left upper quadrant and
surrounding the left kidney.
4.  Aortic Atherosclerosis ([SW]-[SW]).
5. Markedly limited evaluation on this noncontrast study. Recommend
repeat trauma protocol with intravenous contrast (including a
delayed phase of the abdomen and pelvis).

## 2021-01-27 IMAGING — MR MR HEAD W/O CM
9 of 10 series · 39 of 48 positions shown · non-contrast
Comparison: No prior MRI correlation is made with [DATE] CT
brain.

CLINICAL DATA: Neuro deficit, stroke suspected

EXAM:
MRI HEAD WITHOUT CONTRAST
TECHNIQUE: Multiplanar, multiecho pulse sequences of the brain and surrounding
structures were obtained without intravenous contrast.

[Series 3: DWI · axial · 3.0mm · 1.09mm/px · z∈[-28,+121]mm · 11 of 102 slices shown (1 of 4)]
[im 1/102]
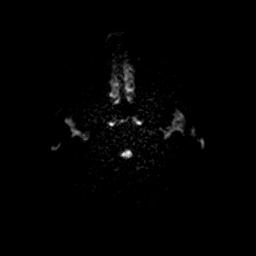
[im 11/102]
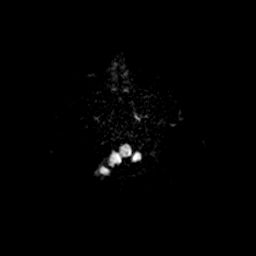
[im 21/102]
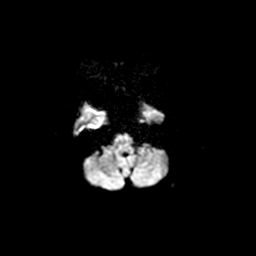
[im 31/102]
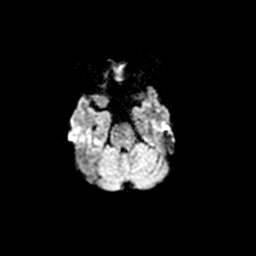
[im 41/102]
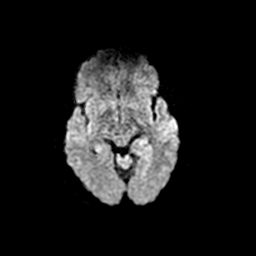
[im 51/102]
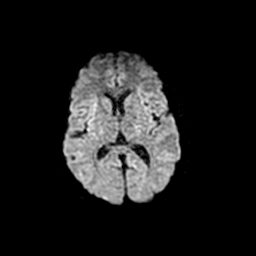
[im 61/102]
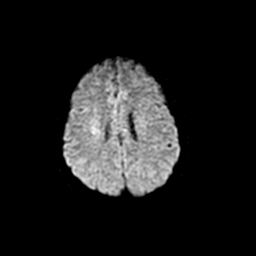
[im 71/102]
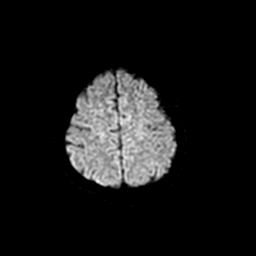
[im 81/102]
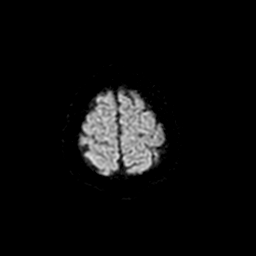
[im 91/102]
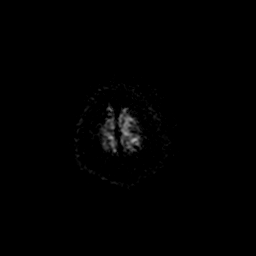
[im 102/102]
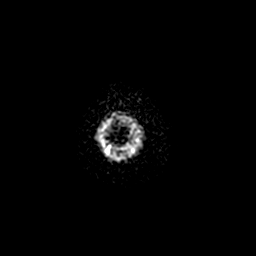

[Series 4: DWI · coronal · 5.0mm · 1.09mm/px · 8 of 76 slices shown (2 of 4)]
[im 1/76]
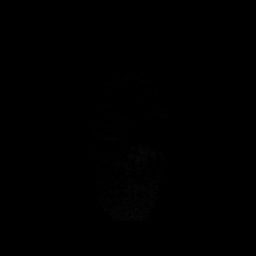
[im 11/76]
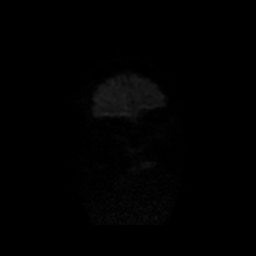
[im 22/76]
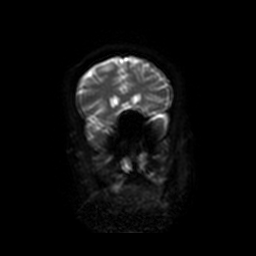
[im 33/76]
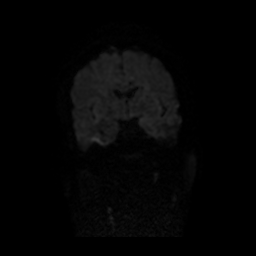
[im 43/76]
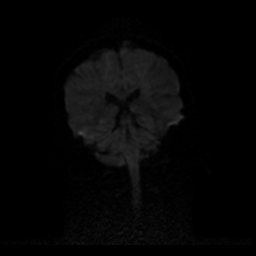
[im 54/76]
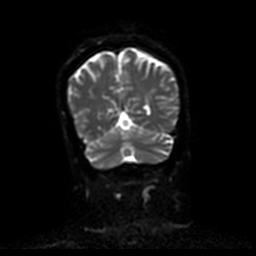
[im 65/76]
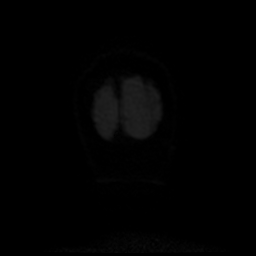
[im 76/76]
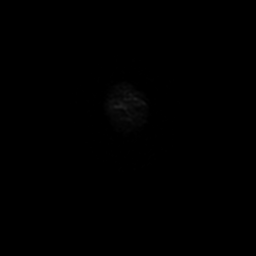

[Series 6: T2 · axial · 5.0mm · 0.43mm/px · z∈[-36,+107]mm · 2 of 25 slices shown (1 of 2)]
[im 1/25]
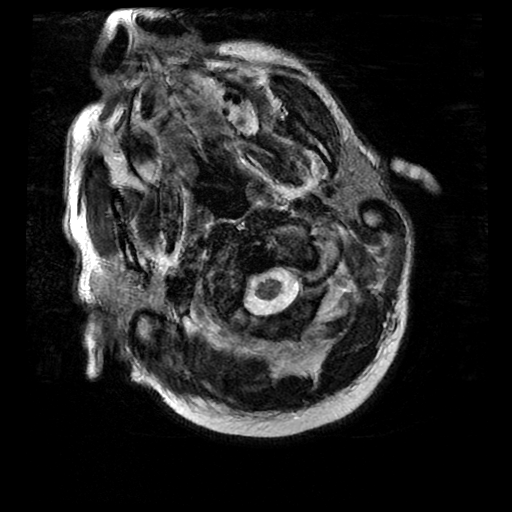
[im 25/25]
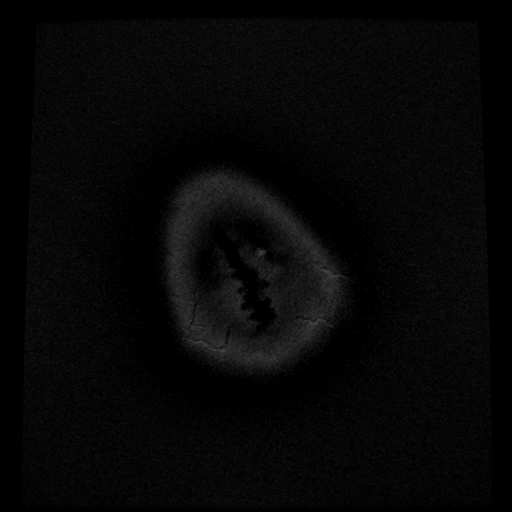

[Series 7: FLAIR · axial · 5.0mm · 0.43mm/px · z∈[-36,+107]mm · 2 of 25 slices shown (1 of 2)]
[im 1/25]
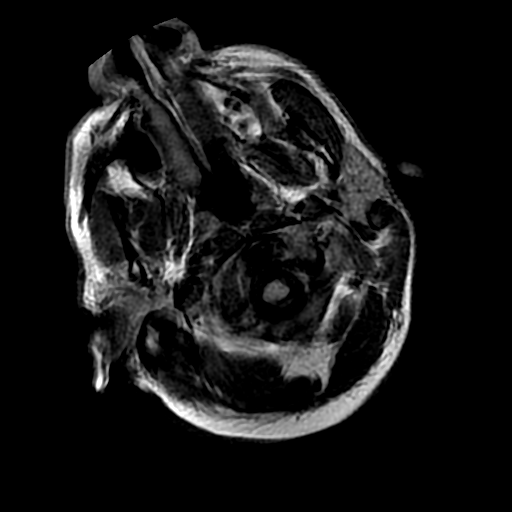
[im 25/25]
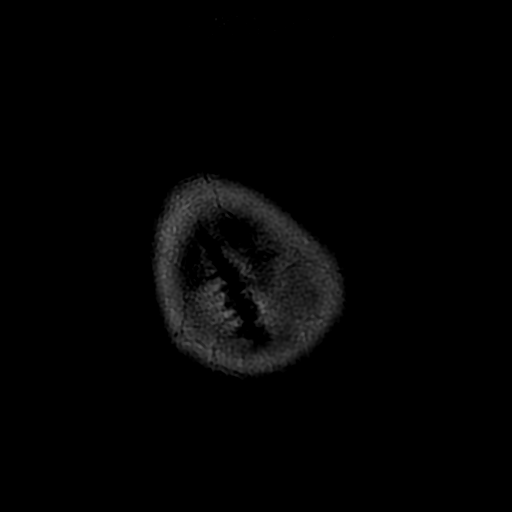

[Series 8: T2 · coronal · 5.0mm · 0.43mm/px · 2 of 24 slices shown (2 of 2)]
[im 1/24]
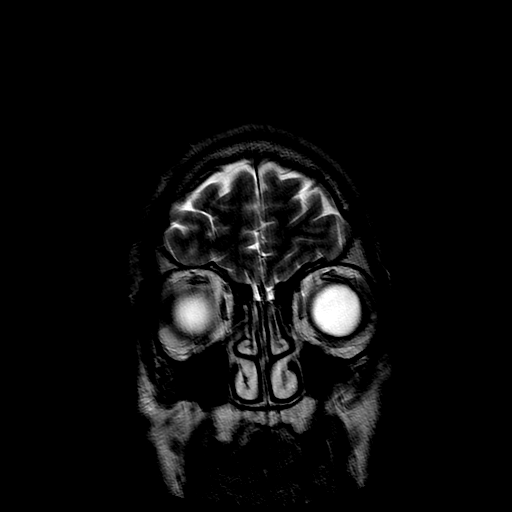
[im 24/24]
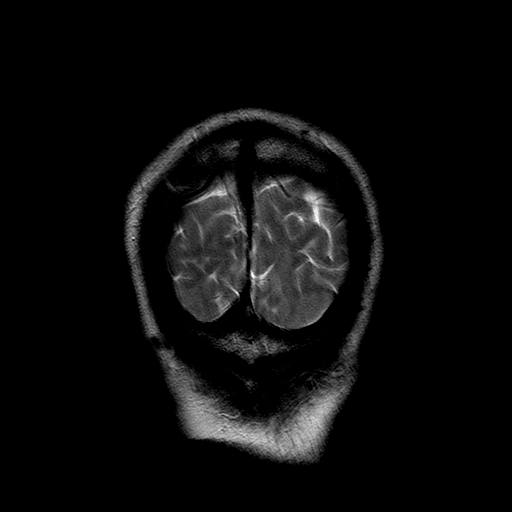

[Series 10: T1 · axial · 3.0mm · 0.43mm/px · z∈[-38,+11]mm · 3 of 100 slices shown]
[im 1/100]
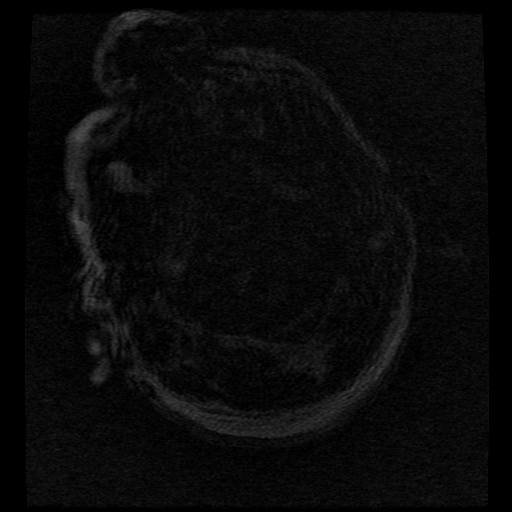
[im 12/100]
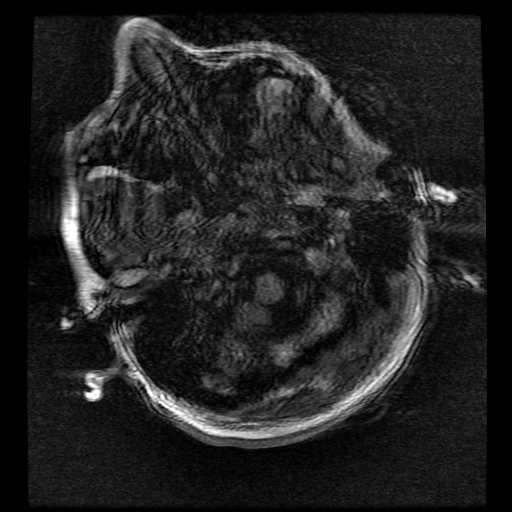
[im 34/100]
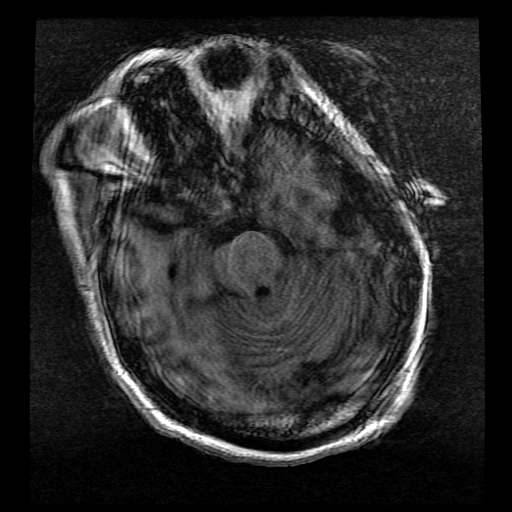

[Series 11: FLAIR · sagittal · 5.0mm · 0.47mm/px · 2 of 23 slices shown (2 of 2)]
[im 1/23]
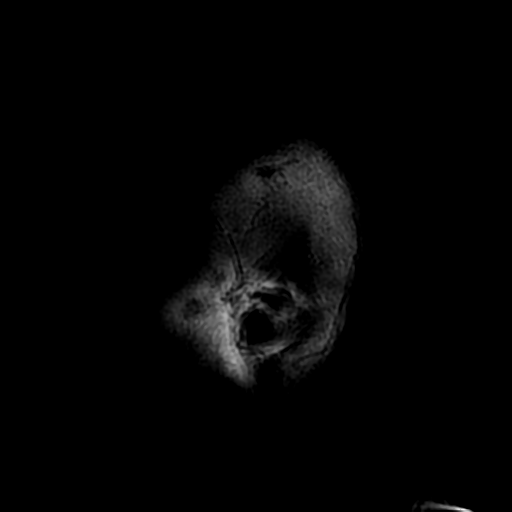
[im 23/23]
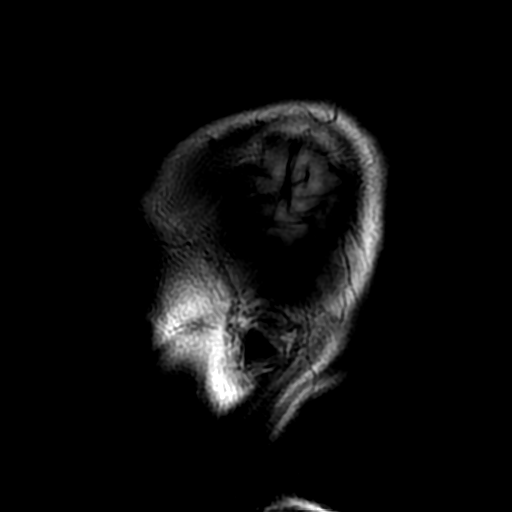

[Series 300: DWI · axial · 3.0mm · 1.09mm/px · z∈[-28,+121]mm · 5 of 51 slices shown (3 of 4)]
[im 1/51]
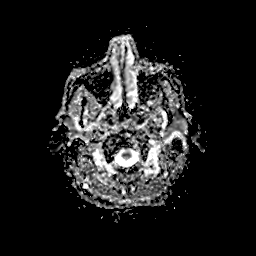
[im 13/51]
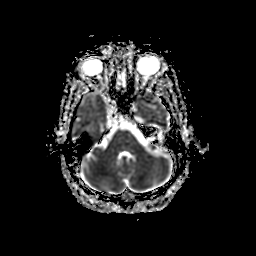
[im 26/51]
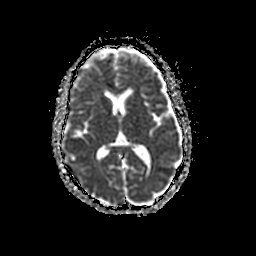
[im 38/51]
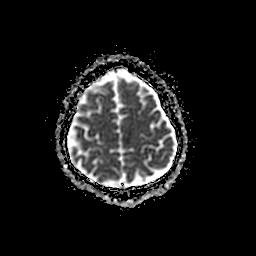
[im 51/51]
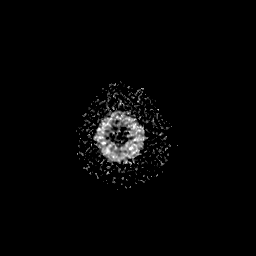

[Series 400: DWI · coronal · 5.0mm · 1.09mm/px · 4 of 38 slices shown (4 of 4)]
[im 1/38]
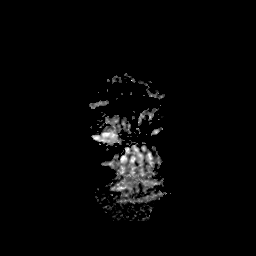
[im 13/38]
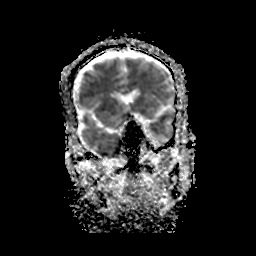
[im 25/38]
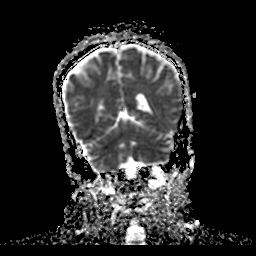
[im 38/38]
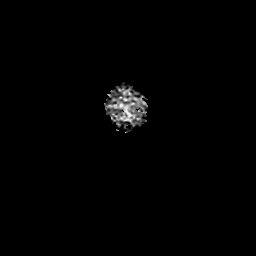

[39 of 48 positions shown; findings below may reference images not displayed]

FINDINGS: Brain: Evaluation somewhat limited by motion artifact. No acute
infarction, hemorrhage, hydrocephalus, mass, mass effect, or midline
shift. Scattered T2 hyperintense signal in the periventricular white
matter, likely the sequela of chronic small vessel ischemic disease.

Vascular: Normal flow voids.

Skull and upper cervical spine: Normal marrow signal.

Sinuses/Orbits: Imaged sinuses are clear. The orbits are
unremarkable.

Other: Trace fluid in mastoid air cells.
IMPRESSION: No acute intracranial process.

## 2021-01-27 MED ORDER — LORAZEPAM 1 MG PO TABS
1.0000 mg | ORAL_TABLET | ORAL | Status: AC | PRN
  Administered 2021-01-28 – 2021-01-29 (×2): 1 mg via ORAL
  Filled 2021-01-27 (×2): qty 1

## 2021-01-27 MED ORDER — THIAMINE HCL 100 MG/ML IJ SOLN
500.0000 mg | Freq: Three times a day (TID) | INTRAVENOUS | Status: AC
  Administered 2021-01-27 – 2021-01-28 (×3): 500 mg via INTRAVENOUS
  Filled 2021-01-27 (×4): qty 5

## 2021-01-27 MED ORDER — LACTATED RINGERS IV SOLN: INTRAVENOUS | Status: DC

## 2021-01-27 MED ORDER — SODIUM CHLORIDE 0.9 % IV SOLN: INTRAVENOUS | Status: DC

## 2021-01-27 MED ORDER — SODIUM CHLORIDE 0.9 % IV BOLUS
500.0000 mL | Freq: Once | INTRAVENOUS | Status: AC
  Administered 2021-01-27: 500 mL via INTRAVENOUS

## 2021-01-27 MED ORDER — LORAZEPAM 2 MG/ML IJ SOLN
1.0000 mg | Freq: Once | INTRAMUSCULAR | Status: AC | PRN
  Administered 2021-01-27: 1 mg via INTRAVENOUS
  Filled 2021-01-27: qty 1

## 2021-01-27 MED ORDER — THIAMINE HCL 100 MG PO TABS: 100.0000 mg | ORAL_TABLET | Freq: Every day | ORAL | Status: DC

## 2021-01-27 MED ORDER — ADULT MULTIVITAMIN W/MINERALS CH: 1.0000 | ORAL_TABLET | Freq: Every day | ORAL | Status: DC

## 2021-01-27 MED ORDER — THIAMINE HCL 100 MG/ML IJ SOLN
250.0000 mg | Freq: Every day | INTRAVENOUS | Status: DC
  Administered 2021-01-28: 250 mg via INTRAVENOUS
  Filled 2021-01-27: qty 2.5

## 2021-01-27 MED ORDER — THIAMINE HCL 100 MG/ML IJ SOLN: 100.0000 mg | Freq: Every day | INTRAMUSCULAR | Status: DC

## 2021-01-27 MED ORDER — LORAZEPAM 2 MG/ML IJ SOLN: 0.0000 mg | Freq: Four times a day (QID) | INTRAMUSCULAR | Status: AC

## 2021-01-27 MED ORDER — LORAZEPAM 2 MG/ML IJ SOLN
0.0000 mg | Freq: Two times a day (BID) | INTRAMUSCULAR | Status: AC
  Administered 2021-01-29 – 2021-01-30 (×3): 2 mg via INTRAVENOUS
  Filled 2021-01-27 (×3): qty 1

## 2021-01-27 MED ORDER — LORAZEPAM 2 MG/ML IJ SOLN
1.0000 mg | INTRAMUSCULAR | Status: AC | PRN
  Administered 2021-01-29: 2 mg via INTRAVENOUS
  Filled 2021-01-27: qty 1

## 2021-01-27 MED ORDER — FOLIC ACID 1 MG PO TABS: 1.0000 mg | ORAL_TABLET | Freq: Every day | ORAL | Status: DC

## 2021-01-27 NOTE — ED Notes (Signed)
Pt has not had a BM today. No stool sample to collect.

## 2021-01-27 NOTE — Progress Notes (Addendum)
Notified by Radiologist, Dr. Lequita Halt, that the noncontrast CT abdomen and pelvis does not give any useful information. Has hematoma in gluteal but cannot determine where bleeding from.  Pt has AKI and is on IVF hydration since being in ER.  Radiology requests a CT Abdomen/Pelvis with contrast with tramua protocol and delayed images to better evaluate for source of bleeding. Continue IVF hydration.  CT ordered.   Addendum: Discussed with radiology, Dr. Stephan Minister, who doesn't see need for another stat CT scan with contrast in light of his renal function. He states that hematoma looks stable.  Pt is hemodynamically stable and Dr. Stephan Minister does not see active bleeding on scans already. Recommends delaying the scan until renal function improves as pt is hemodynamically stable and not in distress.

## 2021-01-27 NOTE — ED Notes (Signed)
Placed Breakfast orders 

## 2021-01-27 NOTE — ED Notes (Signed)
Lunch ordered 

## 2021-01-27 NOTE — ED Notes (Signed)
Pt noted to have respirations from 7-10. Pt arousable to voice and answering all questions appropriately. MD aware.

## 2021-01-27 NOTE — Consult Note (Signed)
ORTHOPAEDIC CONSULTATION  REQUESTING PHYSICIAN: Zigmund Daniel., *  PCP:  Pcp, No  Chief Complaint: Rule out compartment syndrome  HPI: Frank [name] is a 50 y.o. male who complains of right lower extremity paresthesias and right hip pain.  Patient has history of polysubstance abuse.  He apparently smokes cigarettes that have crack sprinkled on them.  [place] an unknown amount of time.  This incident was yesterday over 24 hours ago.  I was consulted by the hospitalist to rule out compartment syndrome.  Past Medical History:  Diagnosis Date   Arthritis    Asthma    Gout    Rib fracture    No past surgical history on file. Social History   Socioeconomic History   Marital status: Single    Spouse name: Not on file   Number of children: Not on file   Years of education: Not on file   Highest education level: Not on file  Occupational History   Not on file  Tobacco Use   Smoking status: Every Day    Packs/day: 0.20    Types: Cigarettes   Smokeless tobacco: Not on file  Substance and Sexual Activity   Alcohol use: Yes    Alcohol/week: 15.0 standard drinks    Types: 15 Cans of beer per week    Comment: daily   Drug use: Yes    Types: Marijuana, Cocaine, "Crack" cocaine    Comment: "crack sprinkled on top of tobacco"    Sexual activity: Not on file  Other Topics Concern   Not on file  Social History Narrative   Not on file   Social Determinants of Health   Financial Resource Strain: Not on file  Food Insecurity: Not on file  Transportation Needs: Not on file  Physical Activity: Not on file  Stress: Not on file  Social Connections: Not on file   No family history on file. Allergies  Allergen Reactions   Other Hives and Itching    Allergic to white chocolate   Prior to Admission medications   Medication Sig Start Date End Date Taking? Authorizing Provider  albuterol (PROVENTIL) (2.5 MG/3ML) 0.083%  nebulizer solution Take 3 mLs (2.5 mg total) by nebulization every 4 (four) hours as needed for wheezing or shortness of breath. 07/11/20  Yes Arby Barrette, MD  albuterol (VENTOLIN HFA) 108 (90 Base) MCG/ACT inhaler Inhale 2 puffs into the lungs every 4 (four) hours as needed for wheezing or shortness of breath. 07/11/20  Yes Arby Barrette, MD  albuterol (PROVENTIL HFA;VENTOLIN HFA) 108 (90 BASE) MCG/ACT inhaler Inhale 1-2 puffs into the lungs every 4 (four) hours as needed for wheezing or shortness of breath. Patient not taking: Reported on 01/26/2021 02/05/13   Marisa Severin, MD  azithromycin (ZITHROMAX Z-PAK) 250 MG tablet Take 1 tablet (250 mg total) by mouth daily. 2 tablets on the first day.  1 tablet daily for the next 4 days Patient not taking: Reported on 01/26/2021 07/11/20   Arby Barrette, MD  naproxen (NAPROSYN) 500 MG tablet Take 1 tablet (500 mg total) by mouth 2 (two) times daily as needed for moderate pain. Patient not taking: Reported on 01/26/2021 09/03/20   Petrucelli, Pleas Koch, PA-C  predniSONE (DELTASONE) 20 MG tablet 3 tabs po day one, then 2 po daily x 4 days Patient not taking: Reported on 01/26/2021 07/11/20   Arby Barrette, MD   DG Ribs Unilateral W/Chest Right  Result Date: 01/26/2021 CLINICAL DATA:  Status post fall. EXAM: RIGHT RIBS AND CHEST - 3+ VIEW COMPARISON:  None. FINDINGS: No acute displaced right rib fracture or other bone lesions are seen involving the ribs. The heart and mediastinal contours are within normal limits. No focal consolidation. No pulmonary edema. No pleural effusion. No pneumothorax. No acute osseous abnormality. IMPRESSION: 1. No acute displaced right rib fracture. Please note, nondisplaced rib fractures may be occult on radiograph. 2. No acute cardiopulmonary abnormality. Electronically Signed   By: Tish Frederickson M.D.   On: 01/26/2021 17:15   DG Ankle Complete Left  Result Date: 01/26/2021 CLINICAL DATA:  Chills and cough.  Status post fall.  EXAM: LEFT ANKLE COMPLETE - 3+ VIEW COMPARISON:  None. FINDINGS: There is no evidence of fracture, dislocation, or joint effusion. There is no evidence of arthropathy or other focal bone abnormality. Soft tissues are unremarkable. IMPRESSION: Negative. Electronically Signed   By: Signa Kell M.D.   On: 01/26/2021 17:10   DG Ankle Complete Right  Result Date: 01/26/2021 CLINICAL DATA:  Chills and cough, fall. EXAM: RIGHT ANKLE - COMPLETE 3+ VIEW COMPARISON:  None. FINDINGS: There is no evidence of fracture, dislocation, or joint effusion. There is no evidence of arthropathy or other focal bone abnormality. Soft tissues are unremarkable. IMPRESSION: Negative. Electronically Signed   By: Darliss Cheney M.D.   On: 01/26/2021 17:09   CT HEAD WO CONTRAST ( )  Result Date: 01/26/2021 CLINICAL DATA:  Head trauma, mod-severe; Neck trauma, intoxicated or obtunded (Age >= 16y). EtOH on board, syncope/fall and hit head. EXAM: CT HEAD WITHOUT CONTRAST CT CERVICAL SPINE WITHOUT CONTRAST TECHNIQUE: Multidetector CT imaging of the head and cervical spine was performed following the standard protocol without intravenous contrast. Multiplanar CT image reconstructions of the cervical spine were also generated. COMPARISON:  09/03/2020 FINDINGS: CT HEAD FINDINGS Brain: There is no evidence of an acute infarct, intracranial hemorrhage, mass, midline shift, or extra-axial fluid collection. The ventricles and sulci are normal. Vascular: No hyperdense vessel. Skull: No fracture or suspicious osseous lesion. Sinuses/Orbits: Visualized paranasal sinuses and mastoid air cells are clear. Unremarkable orbits. Other: None. CT CERVICAL SPINE FINDINGS Alignment: Straightening of the normal cervical lordosis. Chronic trace anterolisthesis of C4 on C5. Skull base and vertebrae: No acute fracture or suspicious osseous lesion. Soft tissues and spinal canal: No prevertebral fluid or swelling. No visible canal hematoma. Disc levels: Similar  appearance of multilevel disc degeneration, greatest at C5-6 and C6-7 with uncovertebral spurring resulting in moderate bilateral neural foraminal stenosis. Upper chest: Clear lung apices. Other: None. IMPRESSION: 1. No evidence of acute intracranial abnormality. 2. No acute cervical spine fracture. Electronically Signed   By: Sebastian Ache M.D.   On: 01/26/2021 17:50   CT Cervical Spine Wo Contrast  Result Date: 01/26/2021 CLINICAL DATA:  Head trauma, mod-severe; Neck trauma, intoxicated or obtunded (Age >= 16y). EtOH on board, syncope/fall and hit head. EXAM: CT HEAD WITHOUT CONTRAST CT CERVICAL SPINE WITHOUT CONTRAST TECHNIQUE: Multidetector CT imaging of the head and cervical spine was performed following the standard protocol without intravenous contrast. Multiplanar CT image reconstructions of the cervical spine were also generated. COMPARISON:  09/03/2020 FINDINGS: CT HEAD FINDINGS Brain: There is no evidence of an acute infarct, intracranial hemorrhage, mass, midline shift, or extra-axial fluid collection. The ventricles and sulci are normal. Vascular: No hyperdense vessel. Skull: No fracture or suspicious osseous lesion. Sinuses/Orbits: Visualized paranasal sinuses and mastoid air cells are clear. Unremarkable orbits. Other: None. CT CERVICAL SPINE  FINDINGS Alignment: Straightening of the normal cervical lordosis. Chronic trace anterolisthesis of C4 on C5. Skull base and vertebrae: No acute fracture or suspicious osseous lesion. Soft tissues and spinal canal: No prevertebral fluid or swelling. No visible canal hematoma. Disc levels: Similar appearance of multilevel disc degeneration, greatest at C5-6 and C6-7 with uncovertebral spurring resulting in moderate bilateral neural foraminal stenosis. Upper chest: Clear lung apices. Other: None. IMPRESSION: 1. No evidence of acute intracranial abnormality. 2. No acute cervical spine fracture. Electronically Signed   By: Sebastian Ache M.D.   On: 01/26/2021 17:50    CT PELVIS WO CONTRAST  Result Date: 01/27/2021 CLINICAL DATA:  Pelvic trauma, right lower extremity numbness, right hip pain EXAM: CT PELVIS WITHOUT CONTRAST TECHNIQUE: Multidetector CT imaging of the pelvis was performed following the standard protocol without intravenous contrast. COMPARISON:  Pelvis radiographs 12/21/2013 FINDINGS: Urinary Tract:  No abnormality visualized. Bowel: There is no abnormal bowel wall thickening or inflammatory change the level imaged. Vascular/Lymphatic: The vasculature is unremarkable within the confines of noncontrast technique. There is no lymphadenopathy in the pelvis. Reproductive:  The prostate and seminal vesicles are unremarkable. Other: There is blood in the presacral space and right pericolic gutter. There is no free air to the level imaged. Musculoskeletal: There is an intramuscular hematoma in the right gluteus musculature with musculotendinous injury with probable involvement of at least the right gluteus medius and piriformis muscles. No fracture is identified. IMPRESSION: 1. Hemoperitoneum, incompletely evaluated. No free air is seen in the pelvis. Recommend CT of the abdomen and pelvis with IV contrast for further evaluation. 2. S on the right uspected musculotendinous injuries with probable involvement of at least the right gluteus medius and piriformis muscles. 3. No fracture is identified. Electronically Signed   By: Lesia Hausen M.D.   On: 01/27/2021 14:01   MR BRAIN WO CONTRAST  Result Date: 01/27/2021 CLINICAL DATA:  Neuro deficit, stroke suspected EXAM: MRI HEAD WITHOUT CONTRAST TECHNIQUE: Multiplanar, multiecho pulse sequences of the brain and surrounding structures were obtained without intravenous contrast. COMPARISON:  No prior MRI correlation is made with 01/26/2021 CT brain. FINDINGS: Brain: Evaluation somewhat limited by motion artifact. No acute infarction, hemorrhage, hydrocephalus, mass, mass effect, or midline shift. Scattered T2  hyperintense signal in the periventricular white matter, likely the sequela of chronic small vessel ischemic disease. Vascular: Normal flow voids. Skull and upper cervical spine: Normal marrow signal. Sinuses/Orbits: Imaged sinuses are clear. The orbits are unremarkable. Other: Trace fluid in mastoid air cells. IMPRESSION: No acute intracranial process. Electronically Signed   By: Wiliam Ke M.D.   On: 01/27/2021 12:23   MR PELVIS WO CONTRAST  Result Date: 01/27/2021 CLINICAL DATA:  Unknown trauma.  Found down.  Abnormal CT scan. EXAM: MRI PELVIS WITHOUT CONTRAST, MRI RIGHT FEMUR WITHOUT CONTRAST TECHNIQUE: Multiplanar multisequence MR imaging of the pelvis was performed. No intravenous contrast was administered. COMPARISON:  CT scan, same date. FINDINGS: Significant trauma involving the right sided pelvic and hip musculature with marked increased T2 signal intensity most notably in the right gluteal muscles. Large intramuscular hematoma in the gluteus minimus muscle. Grade 2 tearing of the gluteus maximus muscle inferiorly along the musculotendinous junction. High-grade partial tearing of the quadratus femoris muscle and the external portion of the obturator internus muscle. Both hips are normally located. No hip or pelvic fractures are identified. The pubic symphysis and SI joints are intact. The hamstring tendons are intact. Free fluid noted in the lower abdomen and pelvis but no discrete  intrapelvic hematoma. Bladder is grossly normal. Prostate gland and seminal vesicles are unremarkable. IMPRESSION: 1. Significant trauma involving the right sided pelvic and hip musculature with extensive muscle tears and intramuscular and interfascial hemorrhage, edema and fluid. 2. No fractures are identified. Electronically Signed   By: Rudie Meyer M.D.   On: 01/27/2021 20:16   US RENAL  Result Date: 01/26/2021 CLINICAL DATA:  Acute kidney injury EXAM: RENAL / URINARY TRACT ULTRASOUND COMPLETE COMPARISON:  None.  FINDINGS: Right Kidney: Renal measurements: 10.4 x 4.6 x 4.2 cm = volume: 105.3 mL. Cortex appears echogenic. No mass or hydronephrosis. Left Kidney: Renal measurements: 8.8 x 5.1 x 3.5 cm = volume: 81.3 mL. Cortex appears slightly echogenic. No mass or hydronephrosis Bladder: Debris in the urinary bladder Other: None. IMPRESSION: 1. Kidneys appear slightly echogenic suggesting medical renal disease. No hydronephrosis 2. Debris in the urinary bladder Electronically Signed   By: Jasmine Pang M.D.   On: 01/26/2021 23:30   MR GDJME RIGHT WO CONTRAST  Result Date: 01/27/2021 CLINICAL DATA:  Unknown trauma.  Found down.  Abnormal CT scan. EXAM: MRI PELVIS WITHOUT CONTRAST, MRI RIGHT FEMUR WITHOUT CONTRAST TECHNIQUE: Multiplanar multisequence MR imaging of the pelvis was performed. No intravenous contrast was administered. COMPARISON:  CT scan, same date. FINDINGS: Significant trauma involving the right sided pelvic and hip musculature with marked increased T2 signal intensity most notably in the right gluteal muscles. Large intramuscular hematoma in the gluteus minimus muscle. Grade 2 tearing of the gluteus maximus muscle inferiorly along the musculotendinous junction. High-grade partial tearing of the quadratus femoris muscle and the external portion of the obturator internus muscle. Both hips are normally located. No hip or pelvic fractures are identified. The pubic symphysis and SI joints are intact. The hamstring tendons are intact. Free fluid noted in the lower abdomen and pelvis but no discrete intrapelvic hematoma. Bladder is grossly normal. Prostate gland and seminal vesicles are unremarkable. IMPRESSION: 1. Significant trauma involving the right sided pelvic and hip musculature with extensive muscle tears and intramuscular and interfascial hemorrhage, edema and fluid. 2. No fractures are identified. Electronically Signed   By: Rudie Meyer M.D.   On: 01/27/2021 20:16   DG Hip Unilat W or Wo Pelvis 2-3  Views Right  Result Date: 01/26/2021 CLINICAL DATA:  Status post fall. EXAM: DG HIP (WITH OR WITHOUT PELVIS) 2-3V RIGHT COMPARISON:  01/29/2009 FINDINGS: There is no evidence of hip fracture or dislocation. There is no evidence of arthropathy or other focal bone abnormality. IMPRESSION: Negative. Electronically Signed   By: Signa Kell M.D.   On: 01/26/2021 17:08   VAS [place] LOWER EXTREMITY VENOUS (DVT)  Result Date: 01/27/2021  Lower Venous DVT Study Patient Name:  DERRYCK SHAHAN  Date of Exam:   01/26/2021 Medical Rec #: [id number]        Accession #:    [id number] Date of Birth: [birth year]        Patient Gender: M Patient Age:   72 years Exam Location:  [facility] Procedure:      VAS [place] LOWER EXTREMITY VENOUS (DVT) Referring Phys: Lynden Oxford --------------------------------------------------------------------------------  Indications: Pain s/p fall.  Comparison Study: No prior studies. Performing Technologist: Jean Rosenthal RDMS, RVT  Examination Guidelines: A complete evaluation includes B-mode imaging, spectral Doppler, color Doppler, and power Doppler as needed of all accessible portions of each vessel. Bilateral testing is considered an integral part of a complete examination. Limited examinations for reoccurring indications may be performed as noted. The  reflux portion of the exam is performed with the patient in reverse Trendelenburg.  +---------+---------------+---------+-----------+----------+--------------+ RIGHT    CompressibilityPhasicitySpontaneityPropertiesThrombus Aging +---------+---------------+---------+-----------+----------+--------------+ CFV      Full           Yes      Yes                                 +---------+---------------+---------+-----------+----------+--------------+ SFJ      Full                                                        +---------+---------------+---------+-----------+----------+--------------+ FV Prox  Full                                                         +---------+---------------+---------+-----------+----------+--------------+ FV Mid   Full                                                        +---------+---------------+---------+-----------+----------+--------------+ FV DistalFull                                                        +---------+---------------+---------+-----------+----------+--------------+ PFV      Full                                                        +---------+---------------+---------+-----------+----------+--------------+ POP      Full           Yes      Yes                                 +---------+---------------+---------+-----------+----------+--------------+ PTV      Full                                                        +---------+---------------+---------+-----------+----------+--------------+ PERO     Full                                                        +---------+---------------+---------+-----------+----------+--------------+ Gastroc  Full                                                        +---------+---------------+---------+-----------+----------+--------------+  Summary: RIGHT: - There is no evidence of deep vein thrombosis in the lower extremity.  - No cystic structure found in the popliteal fossa.   *See table(s) above for measurements and observations. Electronically signed by Waverly Ferrari MD on 01/27/2021 at 6:44:38 AM.    Final    US Abdomen Limited RUQ (LIVER/GB)  Result Date: 01/26/2021 CLINICAL DATA:  Right upper quadrant pain. EXAM: ULTRASOUND ABDOMEN LIMITED RIGHT UPPER QUADRANT COMPARISON:  CT chest abdomen and pelvis 04/04/2009 report only. FINDINGS: Gallbladder: No gallstones or wall thickening visualized. Gallbladder sludge is present. No sonographic Murphy sign noted by sonographer. Common bile duct: Diameter: 3.7 mm. Liver: No focal lesion identified. Within normal limits in  parenchymal echogenicity. Portal vein is patent on color Doppler imaging with normal direction of blood flow towards the liver. Other: None. IMPRESSION: 1. Gallbladder sludge. No additional sonographic evidence for cholelithiasis or acute cholecystitis. Electronically Signed   By: Darliss Cheney M.D.   On: 01/26/2021 18:55    Positive ROS: All other systems have been reviewed and were otherwise negative with the exception of those mentioned in the HPI and as above.  Physical Exam: General: Alert, no acute distress Cardiovascular: No pedal edema Respiratory: No cyanosis, no use of accessory musculature GI: No organomegaly, abdomen is soft and non-tender Skin: No lesions in the area of chief complaint Neurologic: Sensation intact distally Psychiatric: Patient is competent for consent with normal mood and affect Lymphatic: No axillary or cervical lymphadenopathy  MUSCULOSKELETAL: Examination of the right lower extremity reveals no skin wounds or lesions.  He has mild swelling over the gluteal muscles on the right side.  Mild tenderness to palpation.  No erythema.  Compartments are soft and compressible.  Painless passive range of motion of the knee and foot/ankle.  He has positive motor function dorsiflexion, plantarflexion, and great toe extension.  He can activate his quads.  He has palpable pedal pulses.  He reports nondermatomal sensory change mostly over the distal lateral thigh and medial ankle.  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 No evidence of impending compartment syndrome.  Plan: I discussed the findings with the patient.  No evidence of an impending compartment syndrome.  Luckily he has intact motor function.  Sensation will likely return with time.  No orthopedic intervention.  Supportive care.  We will sign off.    Jonette Pesa, MD [phone]    01/27/2021 10:19 PM

## 2021-01-27 NOTE — Progress Notes (Addendum)
PROGRESS NOTE    Frank [name]  VVO:[id number] DOB: [birth year] DOA: 01/26/2021 PCP: Pcp, No    No chief complaint on file.   Brief Narrative:  50 yo AA male with hx of alcohol abuse, cocaine abuse, and THC use who presents after Frank [name] fall.  Reportedly smelled of etoh and was slurring speech with EMS.  Initially hypotenesive, but this has improved.    Imaging without acute findings.  He's been admitted with acute kidney injury and elevated LFT's in the setting of alcohol abuse.  He's complaining of right sided numbness and imaging is currently pending.   Assessment & Plan:   Principal Problem:   AKI (acute kidney injury) (HCC) Active Problems:   Elevated LFTs   Alcohol abuse   Polysubstance abuse (HCC)   Diarrhea  Addendum Imaging concerning for intramuscular hematoma in R gluteus musculature Planning for MRI pelvis and right thigh.   CK pending, but picture c/w rhabdo.  Increase IVF.  Place foley catheter for strict I/O.  Awaiting CK.  Discussed imaging, right LE numbness with orthopedics in setting of imaging abnormalities above -> recommended MRI pelvis and right thigh, they'll follow with [place]. CT pelvis with hemoperitoneum, follow CT abdomen.  Alcohol Abuse  CIWA protocol Encourage cessation  Mechanical fall  Right Lower Extremity Numbness He reports right sided (RLE mainly) numbness prior to fall yesterday  Follow MRI brain as well as CT of his pelvis  Consider neurology c/s and/or additional imaging - no motor deficits noted  Elevated LFT's 2/2 etoh abuse, trend RUQ [place] notable for sludge  AKI Baseline creatinine <1 Creatinine 3.86 on presentation Follow CK (pending) Follow UA Renal US without hydro   Diarrhea Follow GI panel HIV, acute hepatitis panel pending  Polysubstance abuse Encourage cessation  DVT prophylaxis: (heparin Code Status: full Family Communication: none at bedside Disposition:   Status is: Observation  The patient will require  care spanning > 2 midnights and should be moved to inpatient because: Inpatient level of care appropriate due to severity of illness  Dispo: The patient is from: Home              Anticipated d/c is to:  pending              Patient currently is not medically stable to d/c.   Difficult to place patient No       Consultants:  none  Procedures:  none  Antimicrobials:  Anti-infectives (From admission, onward)    None          Subjective: C/o right sided numbness  Objective: Vitals:   01/27/21 0230 01/27/21 0400 01/27/21 0600 01/27/21 1000  BP: 113/73 123/80 135/89 138/86  Pulse: 79 81 82 79  Resp: (!) 8 10 11 18   Temp:    98.1 F (36.7 C)  TempSrc:    Oral  SpO2: 98% 97% 96% 97%    Intake/Output Summary (Last 24 hours) at 01/27/2021 1128 Last data filed at 01/26/2021 2311 Gross per 24 hour  Intake 1000 ml  Output --  Net 1000 ml   There were no vitals filed for this visit.  Examination:  General exam: Appears calm and comfortable  Respiratory system: Clear to auscultation. Respiratory effort normal. Cardiovascular system: S1 & S2 heard, RRR. Gastrointestinal system: Abdomen is nondistended, soft and nontender.  Central nervous system: Alert and oriented. Moving all extremities.  Subjective decreased sensation to light touch on right lower extremity.  Negative babinski.  Not hyperreflexive to right patellar  reflex.   Extremities: no LEE - TTP to right hip Skin: brusing to R forehead Psychiatry: Judgement and insight appear normal. Mood & affect appropriate.     Data Reviewed: I have personally reviewed following labs and imaging studies  CBC: Recent Labs  Lab 01/26/21 1600  WBC 22.0*  NEUTROABS 19.5*  HGB 16.2  HCT 47.0  MCV 99.8  PLT 298    Basic Metabolic Panel: Recent Labs  Lab 01/26/21 1600  NA 136  K 4.9  CL 96*  CO2 20*  GLUCOSE 136*  BUN 39*  CREATININE 3.86*  CALCIUM 8.0*    GFR: CrCl cannot be calculated (Unknown ideal  weight.).  Liver Function Tests: Recent Labs  Lab 01/26/21 1600  AST 1,140*  ALT 392*  ALKPHOS 64  BILITOT 0.8  PROT 8.0  ALBUMIN 4.4    CBG: No results for input(s): GLUCAP in the last 168 hours.   Recent Results (from the past 240 hour(s))  Resp Panel by RT-PCR (Flu Oaklan Persons&B, Covid) Nasopharyngeal Swab     Status: None   Collection Time: 01/26/21  4:06 PM   Specimen: Nasopharyngeal Swab; Nasopharyngeal(NP) swabs in vial transport medium  Result Value Ref Range Status   SARS Coronavirus 2 by RT PCR NEGATIVE NEGATIVE Final    Comment: (NOTE) SARS-CoV-2 target nucleic acids are NOT DETECTED.  The SARS-CoV-2 RNA is generally detectable in upper respiratory specimens during the acute phase of infection. The lowest concentration of SARS-CoV-2 viral copies this assay can detect is 138 copies/mL. Lacretia Tindall negative result does not preclude SARS-Cov-2 infection and should not be used as the sole basis for treatment or other patient management decisions. Marlaya Turck negative result may occur with  improper specimen collection/handling, submission of specimen other than nasopharyngeal swab, presence of viral mutation(s) within the areas targeted by this assay, and inadequate number of viral copies(<138 copies/mL). Meridian Scherger negative result must be combined with clinical observations, patient history, and epidemiological information. The expected result is Negative.  Fact Sheet for Patients:  BloggerCourse.com  Fact Sheet for Healthcare Providers:  SeriousBroker.it  This test is no t yet approved or cleared by the [place] FDA and  has been authorized for detection and/or diagnosis of SARS-CoV-2 by FDA under an Emergency Use Authorization (EUA). This EUA will remain  in effect (meaning this test can be used) for the duration of the COVID-19 declaration under Section 564(b)(1) of the Act, 21 U.S.C.section 360bbb-3(b)(1), unless the authorization is  terminated  or revoked sooner.       Influenza Kaiden Dardis by PCR NEGATIVE NEGATIVE Final   Influenza B by PCR NEGATIVE NEGATIVE Final    Comment: (NOTE) The Xpert Xpress SARS-CoV-2/FLU/RSV plus assay is intended as an aid in the diagnosis of influenza from Nasopharyngeal swab specimens and should not be used as Stevin Bielinski sole basis for treatment. Nasal washings and aspirates are unacceptable for Xpert Xpress SARS-CoV-2/FLU/RSV testing.  Fact Sheet for Patients: BloggerCourse.com  Fact Sheet for Healthcare Providers: SeriousBroker.it  This test is not yet approved or cleared by the [place] FDA and has been authorized for detection and/or diagnosis of SARS-CoV-2 by FDA under an Emergency Use Authorization (EUA). This EUA will remain in effect (meaning this test can be used) for the duration of the COVID-19 declaration under Section 564(b)(1) of the Act, 21 U.S.C. section 360bbb-3(b)(1), unless the authorization is terminated or revoked.  Performed at [facility], [address] [place], [place] [address]  Radiology Studies: DG Ribs Unilateral W/Chest Right  Result Date: 01/26/2021 CLINICAL DATA:  Status post fall. EXAM: RIGHT RIBS AND CHEST - 3+ VIEW COMPARISON:  None. FINDINGS: No acute displaced right rib fracture or other bone lesions are seen involving the ribs. The heart and mediastinal contours are within normal limits. No focal consolidation. No pulmonary edema. No pleural effusion. No pneumothorax. No acute osseous abnormality. IMPRESSION: 1. No acute displaced right rib fracture. Please note, nondisplaced rib fractures may be occult on radiograph. 2. No acute cardiopulmonary abnormality. Electronically Signed   By: Tish Frederickson M.D.   On: 01/26/2021 17:15   DG Ankle Complete Left  Result Date: 01/26/2021 CLINICAL DATA:  Chills and cough.  Status post fall. EXAM: LEFT ANKLE COMPLETE - 3+ VIEW COMPARISON:  None.  FINDINGS: There is no evidence of fracture, dislocation, or joint effusion. There is no evidence of arthropathy or other focal bone abnormality. Soft tissues are unremarkable. IMPRESSION: Negative. Electronically Signed   By: Signa Kell M.D.   On: 01/26/2021 17:10   DG Ankle Complete Right  Result Date: 01/26/2021 CLINICAL DATA:  Chills and cough, fall. EXAM: RIGHT ANKLE - COMPLETE 3+ VIEW COMPARISON:  None. FINDINGS: There is no evidence of fracture, dislocation, or joint effusion. There is no evidence of arthropathy or other focal bone abnormality. Soft tissues are unremarkable. IMPRESSION: Negative. Electronically Signed   By: Darliss Cheney M.D.   On: 01/26/2021 17:09   CT HEAD WO CONTRAST ( )  Result Date: 01/26/2021 CLINICAL DATA:  Head trauma, mod-severe; Neck trauma, intoxicated or obtunded (Age >= 16y). EtOH on board, syncope/fall and hit head. EXAM: CT HEAD WITHOUT CONTRAST CT CERVICAL SPINE WITHOUT CONTRAST TECHNIQUE: Multidetector CT imaging of the head and cervical spine was performed following the standard protocol without intravenous contrast. Multiplanar CT image reconstructions of the cervical spine were also generated. COMPARISON:  09/03/2020 FINDINGS: CT HEAD FINDINGS Brain: There is no evidence of an acute infarct, intracranial hemorrhage, mass, midline shift, or extra-axial fluid collection. The ventricles and sulci are normal. Vascular: No hyperdense vessel. Skull: No fracture or suspicious osseous lesion. Sinuses/Orbits: Visualized paranasal sinuses and mastoid air cells are clear. Unremarkable orbits. Other: None. CT CERVICAL SPINE FINDINGS Alignment: Straightening of the normal cervical lordosis. Chronic trace anterolisthesis of C4 on C5. Skull base and vertebrae: No acute fracture or suspicious osseous lesion. Soft tissues and spinal canal: No prevertebral fluid or swelling. No visible canal hematoma. Disc levels: Similar appearance of multilevel disc degeneration, greatest at  C5-6 and C6-7 with uncovertebral spurring resulting in moderate bilateral neural foraminal stenosis. Upper chest: Clear lung apices. Other: None. IMPRESSION: 1. No evidence of acute intracranial abnormality. 2. No acute cervical spine fracture. Electronically Signed   By: Sebastian Ache M.D.   On: 01/26/2021 17:50   CT Cervical Spine Wo Contrast  Result Date: 01/26/2021 CLINICAL DATA:  Head trauma, mod-severe; Neck trauma, intoxicated or obtunded (Age >= 16y). EtOH on board, syncope/fall and hit head. EXAM: CT HEAD WITHOUT CONTRAST CT CERVICAL SPINE WITHOUT CONTRAST TECHNIQUE: Multidetector CT imaging of the head and cervical spine was performed following the standard protocol without intravenous contrast. Multiplanar CT image reconstructions of the cervical spine were also generated. COMPARISON:  09/03/2020 FINDINGS: CT HEAD FINDINGS Brain: There is no evidence of an acute infarct, intracranial hemorrhage, mass, midline shift, or extra-axial fluid collection. The ventricles and sulci are normal. Vascular: No hyperdense vessel. Skull: No fracture or suspicious osseous lesion. Sinuses/Orbits: Visualized paranasal sinuses and mastoid air cells are  clear. Unremarkable orbits. Other: None. CT CERVICAL SPINE FINDINGS Alignment: Straightening of the normal cervical lordosis. Chronic trace anterolisthesis of C4 on C5. Skull base and vertebrae: No acute fracture or suspicious osseous lesion. Soft tissues and spinal canal: No prevertebral fluid or swelling. No visible canal hematoma. Disc levels: Similar appearance of multilevel disc degeneration, greatest at C5-6 and C6-7 with uncovertebral spurring resulting in moderate bilateral neural foraminal stenosis. Upper chest: Clear lung apices. Other: None. IMPRESSION: 1. No evidence of acute intracranial abnormality. 2. No acute cervical spine fracture. Electronically Signed   By: Sebastian Ache M.D.   On: 01/26/2021 17:50   US RENAL  Result Date: 01/26/2021 CLINICAL DATA:   Acute kidney injury EXAM: RENAL / URINARY TRACT ULTRASOUND COMPLETE COMPARISON:  None. FINDINGS: Right Kidney: Renal measurements: 10.4 x 4.6 x 4.2 cm = volume: 105.3 mL. Cortex appears echogenic. No mass or hydronephrosis. Left Kidney: Renal measurements: 8.8 x 5.1 x 3.5 cm = volume: 81.3 mL. Cortex appears slightly echogenic. No mass or hydronephrosis Bladder: Debris in the urinary bladder Other: None. IMPRESSION: 1. Kidneys appear slightly echogenic suggesting medical renal disease. No hydronephrosis 2. Debris in the urinary bladder Electronically Signed   By: Jasmine Pang M.D.   On: 01/26/2021 23:30   DG Hip Unilat W or Wo Pelvis 2-3 Views Right  Result Date: 01/26/2021 CLINICAL DATA:  Status post fall. EXAM: DG HIP (WITH OR WITHOUT PELVIS) 2-3V RIGHT COMPARISON:  01/29/2009 FINDINGS: There is no evidence of hip fracture or dislocation. There is no evidence of arthropathy or other focal bone abnormality. IMPRESSION: Negative. Electronically Signed   By: Signa Kell M.D.   On: 01/26/2021 17:08   VAS [place] LOWER EXTREMITY VENOUS (DVT)  Result Date: 01/27/2021  Lower Venous DVT Study Patient Name:  Frank [name]  Date of Exam:   01/26/2021 Medical Rec #: [id number]        Accession #:    [id number] Date of Birth: [birth year]        Patient Gender: M Patient Age:   22 years Exam Location:  [facility] Procedure:      VAS [place] LOWER EXTREMITY VENOUS (DVT) Referring Phys: Lynden Oxford --------------------------------------------------------------------------------  Indications: Pain s/p fall.  Comparison Study: No prior studies. Performing Technologist: Jean Rosenthal RDMS, RVT  Examination Guidelines: Cyerra Yim complete evaluation includes B-mode imaging, spectral Doppler, color Doppler, and power Doppler as needed of all accessible portions of each vessel. Bilateral testing is considered an integral part of Jeriel Vivanco complete examination. Limited examinations for reoccurring indications may be performed as noted.  The reflux portion of the exam is performed with the patient in reverse Trendelenburg.  +---------+---------------+---------+-----------+----------+--------------+ RIGHT    CompressibilityPhasicitySpontaneityPropertiesThrombus Aging +---------+---------------+---------+-----------+----------+--------------+ CFV      Full           Yes      Yes                                 +---------+---------------+---------+-----------+----------+--------------+ SFJ      Full                                                        +---------+---------------+---------+-----------+----------+--------------+ FV Prox  Full                                                        +---------+---------------+---------+-----------+----------+--------------+  FV Mid   Full                                                        +---------+---------------+---------+-----------+----------+--------------+ FV DistalFull                                                        +---------+---------------+---------+-----------+----------+--------------+ PFV      Full                                                        +---------+---------------+---------+-----------+----------+--------------+ POP      Full           Yes      Yes                                 +---------+---------------+---------+-----------+----------+--------------+ PTV      Full                                                        +---------+---------------+---------+-----------+----------+--------------+ PERO     Full                                                        +---------+---------------+---------+-----------+----------+--------------+ Gastroc  Full                                                        +---------+---------------+---------+-----------+----------+--------------+     Summary: RIGHT: - There is no evidence of deep vein thrombosis in the lower extremity.  - No cystic structure  found in the popliteal fossa.   *See table(s) above for measurements and observations. Electronically signed by Waverly Ferrari MD on 01/27/2021 at 6:44:38 AM.    Final    US Abdomen Limited RUQ (LIVER/GB)  Result Date: 01/26/2021 CLINICAL DATA:  Right upper quadrant pain. EXAM: ULTRASOUND ABDOMEN LIMITED RIGHT UPPER QUADRANT COMPARISON:  CT chest abdomen and pelvis 04/04/2009 report only. FINDINGS: Gallbladder: No gallstones or wall thickening visualized. Gallbladder sludge is present. No sonographic Murphy sign noted by sonographer. Common bile duct: Diameter: 3.7 mm. Liver: No focal lesion identified. Within normal limits in parenchymal echogenicity. Portal vein is patent on color Doppler imaging with normal direction of blood flow towards the liver. Other: None. IMPRESSION: 1. Gallbladder sludge. No additional sonographic evidence for cholelithiasis or acute cholecystitis. Electronically Signed   By: Darliss Cheney M.D.   On: 01/26/2021 18:55  Scheduled Meds:  folic acid  1 mg Oral Daily   heparin  5,000 Units Subcutaneous Q8H   LORazepam  0-4 mg Intravenous Q6H   Followed by   Melene Muller ON 01/29/2021] LORazepam  0-4 mg Intravenous Q12H   multivitamin with minerals  1 tablet Oral Daily   [START ON 02/02/2021] thiamine  100 mg Oral Daily   Continuous Infusions:  thiamine injection 500 mg (01/27/21 1024)   Followed by   Melene Muller ON 01/28/2021] thiamine injection       LOS: 0 days    Time spent: over 30 min    Lacretia Nicks, MD [facility]   To contact the attending provider between 7A-7P or the covering provider during after hours 7P-7A, please log into the web site [email or web address] and access using universal [place] password for that web site. If you do not have the password, please call the hospital operator.  01/27/2021, 11:28 AM

## 2021-01-27 NOTE — ED Notes (Signed)
Patient refused foley catheter.

## 2021-01-27 NOTE — ED Notes (Signed)
Patient transported to MRI 

## 2021-01-27 NOTE — ED Notes (Signed)
Went in to adjust pt's leads. Pt alert, requesting drink, and repositioned and adjusted HOB. Ginger ale provided

## 2021-01-28 ENCOUNTER — Encounter (HOSPITAL_COMMUNITY): Payer: Self-pay | Admitting: Family Medicine

## 2021-01-28 ENCOUNTER — Other Ambulatory Visit: Payer: Self-pay

## 2021-01-28 LAB — CBC WITH DIFFERENTIAL/PLATELET
Abs Immature Granulocytes: 0.1 10*3/uL — ABNORMAL HIGH (ref 0.00–0.07)
Basophils Absolute: 0 10*3/uL (ref 0.0–0.1)
Basophils Relative: 0 %
Eosinophils Absolute: 0 10*3/uL (ref 0.0–0.5)
Eosinophils Relative: 0 %
HCT: 29.5 % — ABNORMAL LOW (ref 39.0–52.0)
Hemoglobin: 10.7 g/dL — ABNORMAL LOW (ref 13.0–17.0)
Immature Granulocytes: 1 %
Lymphocytes Relative: 7 %
Lymphs Abs: 1 10*3/uL (ref 0.7–4.0)
MCH: 34.4 pg — ABNORMAL HIGH (ref 26.0–34.0)
MCHC: 36.3 g/dL — ABNORMAL HIGH (ref 30.0–36.0)
MCV: 94.9 fL (ref 80.0–100.0)
Monocytes Absolute: 1.2 10*3/uL — ABNORMAL HIGH (ref 0.1–1.0)
Monocytes Relative: 10 %
Neutro Abs: 10.6 10*3/uL — ABNORMAL HIGH (ref 1.7–7.7)
Neutrophils Relative %: 82 %
Platelets: 198 10*3/uL (ref 150–400)
RBC: 3.11 MIL/uL — ABNORMAL LOW (ref 4.22–5.81)
RDW: 12.5 % (ref 11.5–15.5)
WBC: 12.9 10*3/uL — ABNORMAL HIGH (ref 4.0–10.5)
nRBC: 0 % (ref 0.0–0.2)

## 2021-01-28 LAB — COMPREHENSIVE METABOLIC PANEL
ALT: 356 U/L — ABNORMAL HIGH (ref 0–44)
AST: 899 U/L — ABNORMAL HIGH (ref 15–41)
Albumin: 2.6 g/dL — ABNORMAL LOW (ref 3.5–5.0)
Alkaline Phosphatase: 55 U/L (ref 38–126)
Anion gap: 12 (ref 5–15)
BUN: 67 mg/dL — ABNORMAL HIGH (ref 6–20)
CO2: 17 mmol/L — ABNORMAL LOW (ref 22–32)
Calcium: 6.7 mg/dL — ABNORMAL LOW (ref 8.9–10.3)
Chloride: 103 mmol/L (ref 98–111)
Creatinine, Ser: 7.05 mg/dL — ABNORMAL HIGH (ref 0.61–1.24)
GFR, Estimated: 9 mL/min — ABNORMAL LOW (ref >60)
Glucose, Bld: 90 mg/dL (ref 70–99)
Potassium: 4.3 mmol/L (ref 3.5–5.1)
Sodium: 132 mmol/L — ABNORMAL LOW (ref 135–145)
Total Bilirubin: 0.8 mg/dL (ref 0.3–1.2)
Total Protein: 5.1 g/dL — ABNORMAL LOW (ref 6.5–8.1)

## 2021-01-28 LAB — TYPE AND SCREEN
ABO/RH(D): B POS
Antibody Screen: NEGATIVE

## 2021-01-28 LAB — CK: Total CK: >50000 U/L — ABNORMAL HIGH (ref 49–397)

## 2021-01-28 LAB — ABO/RH: ABO/RH(D): B POS

## 2021-01-28 MED ORDER — THIAMINE HCL 100 MG/ML IJ SOLN
500.0000 mg | Freq: Three times a day (TID) | INTRAVENOUS | Status: AC
  Administered 2021-01-28 – 2021-01-30 (×6): 500 mg via INTRAVENOUS
  Filled 2021-01-28 (×7): qty 5

## 2021-01-28 MED ORDER — HYDROMORPHONE HCL 1 MG/ML IJ SOLN
0.5000 mg | INTRAMUSCULAR | Status: DC | PRN
  Administered 2021-01-29 – 2021-02-05 (×21): 0.5 mg via INTRAVENOUS
  Filled 2021-01-28 (×21): qty 0.5

## 2021-01-28 MED ORDER — THIAMINE HCL 100 MG/ML IJ SOLN
250.0000 mg | Freq: Every day | INTRAVENOUS | Status: AC
  Administered 2021-01-30 – 2021-02-03 (×5): 250 mg via INTRAVENOUS
  Filled 2021-01-28: qty 2
  Filled 2021-01-28 (×6): qty 2.5

## 2021-01-28 MED ORDER — THIAMINE HCL 100 MG PO TABS
100.0000 mg | ORAL_TABLET | Freq: Every day | ORAL | Status: DC
  Administered 2021-02-04 – 2021-02-06 (×3): 100 mg via ORAL
  Filled 2021-01-28 (×4): qty 1

## 2021-01-28 NOTE — Progress Notes (Signed)
Patient refusing daily weight. 

## 2021-01-28 NOTE — Consult Note (Signed)
Reason for Consult:retroperitoneal stranding and hemorrhage Referring Physician: [name] is an 50 y.o. male.  HPI: 50yo M with PMHx ETOH and cocaine abuse was brought in by EMS on 10/4 after being found down in some bushes by a gas station. He reported at that time that he had been drinking and tripped over a cement block and fell into the bushes. He was noted to have AKI and was admitted to the Hospitalist Service. Since admission CK was found to be >50k C/W rhabdomyolysis. MR pelvis and R thigh showed muscular trauma to R pelvis and hip areas. He was seen by Dr. 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 He went on to have a CT A/P which again showed the right gluteal hematoma.  It also showed marked perinephric stranding around the right kidney with a small amount of retroperitoneal hematoma.  Additionally, some stranding is seen around the left kidney.  No intra-abdominal injury was noted.  This was a noncontrast study due to AKI.  We are asked to evaluate the retroperitoneal findings.  Currently, he complains of pain in his right buttock, right leg, numbness in his right foot, and also notes some abdominal pain.  He reports he has been drinking some liquids but has not eaten anything yet this morning.  He is a poor historian and cannot tell me much about what happened prior to admission.  Past Medical History:  Diagnosis Date   Arthritis    Asthma    Gout    Rib fracture     No past surgical history on file.  No family history on file.  Social History:  reports that he has been smoking cigarettes. He has been smoking an average of .2 packs per day. He does not have any smokeless tobacco history on file. He reports current alcohol use of about 15.0 standard drinks per week. He reports current drug use. Drugs: Marijuana, Cocaine, and "Crack" cocaine.  Allergies:  Allergies  Allergen Reactions   Other Hives  and Itching    Allergic to white chocolate    Medications: I have reviewed the patient's current medications.  Results for orders placed or performed during the hospital encounter of 01/26/21 (from the past 48 hour(s))  CBC with Differential     Status: Abnormal   Collection Time: 01/26/21  4:00 PM  Result Value Ref Range   WBC 22.0 (H) 4.0 - 10.5 K/uL   RBC 4.71 4.22 - 5.81 MIL/uL   Hemoglobin 16.2 13.0 - 17.0 g/dL   HCT 96.2 95.2 - 84.1 %   MCV 99.8 80.0 - 100.0 fL   MCH 34.4 (H) 26.0 - 34.0 pg   MCHC 34.5 30.0 - 36.0 g/dL   RDW 32.4 40.1 - 02.7 %   Platelets 298 150 - 400 K/uL   nRBC 0.0 0.0 - 0.2 %   Neutrophils Relative % 89 %   Neutro Abs 19.5 (H) 1.7 - 7.7 K/uL   Lymphocytes Relative 2 %   Lymphs Abs 0.5 (L) 0.7 - 4.0 K/uL   Monocytes Relative 8 %   Monocytes Absolute 1.8 (H) 0.1 - 1.0 K/uL   Eosinophils Relative 0 %   Eosinophils Absolute 0.0 0.0 - 0.5 K/uL   Basophils Relative 0 %   Basophils Absolute 0.0 0.0 - 0.1 K/uL   Immature Granulocytes 1 %   Abs Immature Granulocytes 0.19 (H) 0.00 - 0.07 K/uL    Comment: Performed at [facility], [address] [place], [place] [address]  Comprehensive metabolic panel     Status: Abnormal   Collection Time: 01/26/21  4:00 PM  Result Value Ref Range   Sodium 136 135 - 145 mmol/L   Potassium 4.9 3.5 - 5.1 mmol/L   Chloride 96 (L) 98 - 111 mmol/L   CO2 20 (L) 22 - 32 mmol/L   Glucose, Bld 136 (H) 70 - 99 mg/dL    Comment: Glucose reference range applies only to samples taken after fasting for at least 8 hours.   BUN 39 (H) 6 - 20 mg/dL   Creatinine, Ser 6.04 (H) 0.61 - 1.24 mg/dL   Calcium 8.0 (L) 8.9 - 10.3 mg/dL   Total Protein 8.0 6.5 - 8.1 g/dL   Albumin 4.4 3.5 - 5.0 g/dL   AST 5,409 (H) 15 - 41 U/L   ALT 392 (H) 0 - 44 U/L   Alkaline Phosphatase 64 38 - 126 U/L   Total Bilirubin 0.8 0.3 - 1.2 mg/dL   GFR, Estimated 18 (L) >60 mL/min    Comment: (NOTE) Calculated using the CKD-EPI Creatinine Equation  (2021)    Anion gap 20 (H) 5 - 15    Comment: Performed at [facility], [address] [place], [place] [address]  Ethanol     Status: None   Collection Time: 01/26/21  4:00 PM  Result Value Ref Range   Alcohol, Ethyl (B) <10 <10 mg/dL    Comment: (NOTE) Lowest detectable limit for serum alcohol is 10 mg/dL.  For medical purposes only. Performed at [facility], [address], [place], [place] [address]   Troponin I (High Sensitivity)     Status: Abnormal   Collection Time: 01/26/21  4:00 PM  Result Value Ref Range   Troponin I (High Sensitivity) 45 (H) <18 ng/L    Comment: (NOTE) Elevated high sensitivity troponin I (hsTnI) values and significant  changes across serial measurements may suggest ACS but many other  chronic and acute conditions are known to elevate hsTnI results.  Refer to the "Links" section for chest pain algorithms and additional  guidance. Performed at [facility], [address], [place], [place] [address]   Ammonia     Status: Abnormal   Collection Time: 01/26/21  4:00 PM  Result Value Ref Range   Ammonia 56 (H) 9 - 35 umol/L    Comment: Performed at [facility], [address] [place], [place] [address]  TSH     Status: None   Collection Time: 01/26/21  4:00 PM  Result Value Ref Range   TSH 2.341 0.350 - 4.500 uIU/mL    Comment: Performed by a 3rd Generation assay with a functional sensitivity of <=0.01 uIU/mL. Performed at [facility], [address], [place], [place] [address]   Lactic acid, plasma     Status: Abnormal   Collection Time: 01/26/21  4:00 PM  Result Value Ref Range   Lactic Acid, Venous 2.3 (HH) 0.5 - 1.9 mmol/L    Comment: CRITICAL RESULT CALLED TO, READ BACK BY AND VERIFIED WITH:  Melburn Hake, RN, 1703, 01/26/21, E. ADEDOKUN. Performed at [facility], [address], [place], [place] [address]   Resp Panel by RT-PCR (Flu A&B, Covid) Nasopharyngeal Swab     Status: None   Collection Time:  01/26/21  4:06 PM   Specimen: Nasopharyngeal Swab; Nasopharyngeal(NP) swabs in vial transport medium  Result  Value Ref Range   SARS Coronavirus 2 by RT PCR NEGATIVE NEGATIVE    Comment: (NOTE) SARS-CoV-2 target nucleic acids are NOT DETECTED.  The SARS-CoV-2 RNA is generally detectable in upper respiratory specimens during the acute phase of infection. The lowest concentration of SARS-CoV-2 viral copies this assay can detect is 138 copies/mL. A negative result does not preclude SARS-Cov-2 infection and should not be used as the sole basis for treatment or other patient management decisions. A negative result may occur with  improper specimen collection/handling, submission of specimen other than nasopharyngeal swab, presence of viral mutation(s) within the areas targeted by this assay, and inadequate number of viral copies(<138 copies/mL). A negative result must be combined with clinical observations, patient history, and epidemiological information. The expected result is Negative.  Fact Sheet for Patients:  BloggerCourse.com  Fact Sheet for Healthcare Providers:  SeriousBroker.it  This test is no t yet approved or cleared by the [place] FDA and  has been authorized for detection and/or diagnosis of SARS-CoV-2 by FDA under an Emergency Use Authorization (EUA). This EUA will remain  in effect (meaning this test can be used) for the duration of the COVID-19 declaration under Section 564(b)(1) of the Act, 21 U.S.Franksection 360bbb-3(b)(1), unless the authorization is terminated  or revoked sooner.       Influenza A by PCR NEGATIVE NEGATIVE   Influenza B by PCR NEGATIVE NEGATIVE    Comment: (NOTE) The Xpert Xpress SARS-CoV-2/FLU/RSV plus assay is intended as an aid in the diagnosis of influenza from Nasopharyngeal swab specimens and should not be used as a sole basis for treatment. Nasal washings and aspirates are  unacceptable for Xpert Xpress SARS-CoV-2/FLU/RSV testing.  Fact Sheet for Patients: BloggerCourse.com  Fact Sheet for Healthcare Providers: SeriousBroker.it  This test is not yet approved or cleared by the [place] FDA and has been authorized for detection and/or diagnosis of SARS-CoV-2 by FDA under an Emergency Use Authorization (EUA). This EUA will remain in effect (meaning this test can be used) for the duration of the COVID-19 declaration under Section 564(b)(1) of the Act, 21 U.S.C. section 360bbb-3(b)(1), unless the authorization is terminated or revoked.  Performed at [facility], [address], [place], [place] [address]   Troponin I (High Sensitivity)     Status: Abnormal   Collection Time: 01/26/21  5:47 PM  Result Value Ref Range   Troponin I (High Sensitivity) 47 (H) <18 ng/L    Comment: (NOTE) Elevated high sensitivity troponin I (hsTnI) values and significant  changes across serial measurements may suggest ACS but many other  chronic and acute conditions are known to elevate hsTnI results.  Refer to the "Links" section for chest pain algorithms and additional  guidance. Performed at [facility], [address], [place], [place] [address]   Protime-INR     Status: None   Collection Time: 01/26/21  5:47 PM  Result Value Ref Range   Prothrombin Time 13.8 11.4 - 15.2 seconds   INR 1.1 0.8 - 1.2    Comment: (NOTE) INR goal varies based on device and disease states. Performed at [facility], [address] [place], [place] [address]   Lactic acid, plasma     Status: None   Collection Time: 01/26/21  5:49 PM  Result Value Ref Range   Lactic Acid, Venous 1.5 0.5 - 1.9 mmol/L    Comment: Performed at [facility], [address], [place], [place] [address]  Hepatitis  panel, acute     Status: None (Preliminary result)   Collection Time: 01/26/21  9:18 PM  Result Value Ref Range    Hepatitis B Surface Ag NON REACTIVE NON REACTIVE   HCV Ab PENDING NON REACTIVE   Hep A IgM NON REACTIVE NON REACTIVE   Hep B C IgM NON REACTIVE NON REACTIVE    Comment: Performed at [facility], [address], [place], [place] [address]  HIV Antibody (routine testing w rflx)     Status: None   Collection Time: 01/26/21  9:18 PM  Result Value Ref Range   HIV Screen 4th Generation wRfx Non Reactive Non Reactive    Comment: Performed at [facility], [address], [place], [place] [address]  Troponin I (High Sensitivity)     Status: Abnormal   Collection Time: 01/26/21 10:09 PM  Result Value Ref Range   Troponin I (High Sensitivity) 39 (H) <18 ng/L    Comment: (NOTE) Elevated high sensitivity troponin I (hsTnI) values and significant  changes across serial measurements may suggest ACS but many other  chronic and acute conditions are known to elevate hsTnI results.  Refer to the "Links" section for chest pain algorithms and additional  guidance. Performed at [facility], [address] [place], [place] [address]   Urinalysis, Complete w Microscopic Urine, Clean Catch     Status: Abnormal   Collection Time: 01/27/21  8:39 AM  Result Value Ref Range   Color, Urine AMBER (A) YELLOW    Comment: BIOCHEMICALS MAY BE AFFECTED BY COLOR   APPearance CLOUDY (A) CLEAR   Specific Gravity, Urine 1.016 1.005 - 1.030   pH 5.0 5.0 - 8.0   Glucose, UA 50 (A) NEGATIVE mg/dL   Hgb urine dipstick LARGE (A) NEGATIVE   Bilirubin Urine NEGATIVE NEGATIVE   Ketones, ur NEGATIVE NEGATIVE mg/dL   Protein, ur 174 (A) NEGATIVE mg/dL   Nitrite NEGATIVE NEGATIVE   Leukocytes,Ua NEGATIVE NEGATIVE   RBC / HPF 6-10 0 - 5 RBC/hpf   WBC, UA 0-5 0 - 5 WBC/hpf   Bacteria, UA RARE (A) NONE SEEN   Squamous Epithelial / LPF 0-5 0 - 5   Mucus PRESENT    Hyaline Casts, UA PRESENT    Amorphous Crystal PRESENT     Comment: Performed at [facility], [address], [place], [place]  [address]  Magnesium     Status: None   Collection Time: 01/27/21  5:11 PM  Result Value Ref Range   Magnesium 2.0 1.7 - 2.4 mg/dL    Comment: Performed at [facility], [address] [place], [place] [address]  CBC with Differential/Platelet     Status: Abnormal   Collection Time: 01/27/21  5:11 PM  Result Value Ref Range   WBC 14.4 (H) 4.0 - 10.5 K/uL   RBC 3.43 (L) 4.22 - 5.81 MIL/uL   Hemoglobin 11.8 (L) 13.0 - 17.0 g/dL    Comment: REPEATED TO VERIFY   HCT 33.4 (L) 39.0 - 52.0 %   MCV 97.4 80.0 - 100.0 fL   MCH 34.4 (H) 26.0 - 34.0 pg   MCHC 35.3 30.0 - 36.0 g/dL   RDW 31.4 97.0 - 26.3 %   Platelets 210 150 - 400 K/uL   nRBC 0.0 0.0 - 0.2 %   Neutrophils Relative % 85 %   Neutro Abs 12.2 (H) 1.7 - 7.7 K/uL   Lymphocytes Relative 6 %  Lymphs Abs 0.9 0.7 - 4.0 K/uL   Monocytes Relative 8 %   Monocytes Absolute 1.2 (H) 0.1 - 1.0 K/uL   Eosinophils Relative 0 %   Eosinophils Absolute 0.0 0.0 - 0.5 K/uL   Basophils Relative 0 %   Basophils Absolute 0.0 0.0 - 0.1 K/uL   Immature Granulocytes 1 %   Abs Immature Granulocytes 0.08 (H) 0.00 - 0.07 K/uL    Comment: Performed at [facility], [address] [place], [place] [address]  Comprehensive metabolic panel     Status: Abnormal   Collection Time: 01/27/21  5:11 PM  Result Value Ref Range   Sodium 129 (L) 135 - 145 mmol/L   Potassium 4.5 3.5 - 5.1 mmol/L   Chloride 99 98 - 111 mmol/L   CO2 18 (L) 22 - 32 mmol/L   Glucose, Bld 127 (H) 70 - 99 mg/dL    Comment: Glucose reference range applies only to samples taken after fasting for at least 8 hours.   BUN 61 (H) 6 - 20 mg/dL   Creatinine, Ser 6.04 (H) 0.61 - 1.24 mg/dL   Calcium 6.4 (LL) 8.9 - 10.3 mg/dL    Comment: CRITICAL RESULT CALLED TO, READ BACK BY AND VERIFIED WITH:  Richarda Blade, RN, 1857, 01/27/21, E. ADEDOKUN.    Total Protein 5.4 (L) 6.5 - 8.1 g/dL   Albumin 2.9 (L) 3.5 - 5.0 g/dL   AST 5,409 (H) 15 - 41 U/L   ALT 373 (H) 0 - 44 U/L   Alkaline  Phosphatase 54 38 - 126 U/L   Total Bilirubin 0.6 0.3 - 1.2 mg/dL   GFR, Estimated 11 (L) >60 mL/min    Comment: (NOTE) Calculated using the CKD-EPI Creatinine Equation (2021)    Anion gap 12 5 - 15    Comment: Performed at [facility], [address] [place], [place] [address]  CK     Status: Abnormal   Collection Time: 01/27/21  5:11 PM  Result Value Ref Range   Total CK >50,000 (H) 49 - 397 U/L    Comment: RESULTS CONFIRMED BY MANUAL DILUTION Performed at [facility], [address], [place], [place] [address]   ABO/Rh     Status: None   Collection Time: 01/27/21  5:11 PM  Result Value Ref Range   ABO/RH(D)      B POS Performed at [facility], [address], [place], [place] [address]   Type and screen [facility]     Status: None   Collection Time: 01/28/21  2:43 AM  Result Value Ref Range   ABO/RH(D) B POS    Antibody Screen NEG    Sample Expiration      01/31/2021,2359 Performed at [facility], [address] [place], [place] [address]   CK     Status: Abnormal   Collection Time: 01/28/21  2:43 AM  Result Value Ref Range   Total CK >50,000 (H) 49 - 397 U/L    Comment: RESULTS CONFIRMED BY MANUAL DILUTION Performed at [facility], [address] [place], [place] [address]   CBC with Differential/Platelet     Status: Abnormal   Collection Time: 01/28/21  8:04 AM  Result Value Ref Range   WBC 12.9 (H) 4.0 - 10.5 K/uL   RBC 3.11 (L) 4.22 - 5.81 MIL/uL   Hemoglobin 10.7 (L) 13.0 - 17.0 g/dL   HCT 29.5 (L) 28.4 - 13.2 %  MCV 94.9 80.0 - 100.0 fL   MCH 34.4 (H) 26.0 - 34.0 pg   MCHC 36.3 (H) 30.0 - 36.0 g/dL   RDW 30.1 60.1 - 09.3 %   Platelets 198 150 - 400 K/uL   nRBC 0.0 0.0 - 0.2 %   Neutrophils Relative % 82 %   Neutro Abs 10.6 (H) 1.7 - 7.7 K/uL   Lymphocytes Relative 7 %   Lymphs Abs 1.0 0.7 - 4.0 K/uL   Monocytes Relative 10 %   Monocytes Absolute 1.2 (H) 0.1 - 1.0 K/uL   Eosinophils Relative 0 %    Eosinophils Absolute 0.0 0.0 - 0.5 K/uL   Basophils Relative 0 %   Basophils Absolute 0.0 0.0 - 0.1 K/uL   Immature Granulocytes 1 %   Abs Immature Granulocytes 0.10 (H) 0.00 - 0.07 K/uL    Comment: Performed at [facility], [address], [place], [place] [address]  Comprehensive metabolic panel     Status: Abnormal   Collection Time: 01/28/21  8:04 AM  Result Value Ref Range   Sodium 132 (L) 135 - 145 mmol/L   Potassium 4.3 3.5 - 5.1 mmol/L   Chloride 103 98 - 111 mmol/L   CO2 17 (L) 22 - 32 mmol/L   Glucose, Bld 90 70 - 99 mg/dL    Comment: Glucose reference range applies only to samples taken after fasting for at least 8 hours.   BUN 67 (H) 6 - 20 mg/dL   Creatinine, Ser 3.22 (H) 0.61 - 1.24 mg/dL   Calcium 6.7 (L) 8.9 - 10.3 mg/dL   Total Protein 5.1 (L) 6.5 - 8.1 g/dL   Albumin 2.6 (L) 3.5 - 5.0 g/dL   AST 025 (H) 15 - 41 U/L   ALT 356 (H) 0 - 44 U/L   Alkaline Phosphatase 55 38 - 126 U/L   Total Bilirubin 0.8 0.3 - 1.2 mg/dL   GFR, Estimated 9 (L) >60 mL/min    Comment: (NOTE) Calculated using the CKD-EPI Creatinine Equation (2021)    Anion gap 12 5 - 15    Comment: Performed at [facility], [address], [place], [place] [address]    CT ABDOMEN WO CONTRAST  Addendum Date: 01/27/2021   ADDENDUM REPORT: 01/27/2021 23:26 ADDENDUM: These results were called by telephone at the time of interpretation on 01/27/2021 at 11:23 pm to provider Dr. Rachael Darby, who verbally acknowledged these results. Electronically Signed   By: Tish Frederickson M.D.   On: 01/27/2021 23:26   Result Date: 01/27/2021 CLINICAL DATA:  Hemoperitoneum. stumble at gas station and fall into bushes, EXAM: CT ABDOMEN AND PELVS WITHOUT CONTRAST TECHNIQUE: Multidetector CT imaging of the abdomen was performed following the standard protocol without IV contrast. COMPARISON:  X-ray chest 01/26/2021, CT pelvis 01/27/2021 FINDINGS: Lower abdomen: No acute abnormality. Liver: Not enlarged. No focal  lesion. Biliary System: The gallbladder is otherwise unremarkable with no radio-opaque gallstones. No biliary ductal dilatation. Pancreas: Normal pancreatic contour. No main pancreatic duct dilatation. Spleen: Not enlarged. No focal lesion. Adrenal Glands: No nodularity bilaterally. Kidneys: Marked perinephric stranding surrounding the right kidney with free fluid coursing inferiorly along the right pericolic gutter. No hydroureteronephrosis. No nephroureterolithiasis. No contour deforming renal mass. The urinary bladder is unremarkable. Bowel: No small or large bowel wall thickening or dilatation. The appendix is unremarkable. Mesentery, Omentum, and Peritoneum: High density trace to small volume abdominal and pelvic free fluid. Fat stranding noted within the left upper quadrant and surrounding the  left kidney. No pneumoperitoneum. No mesenteric hematoma identified. No organized fluid collection. Pelvic Organs: Normal. Lymph Nodes: No abdominal, pelvic, inguinal lymphadenopathy. Vasculature: Atherosclerotic plaque. No abdominal aorta or iliac aneurysm. Musculoskeletal: Markedly asymmetric, hypodense, and heterogeneous right gluteal musculature compared to the left. Associated overlying subcutaneus soft tissue edema. Tiny fat containing umbilical hernia. No suspicious lytic or blastic osseous lesions. No acute displaced fracture. Multilevel degenerative changes of the spine. IMPRESSION: 1. Marked perinephric stranding surrounding the right kidney with at least small volume hemorrhage within the retroperitoneum. Findings concerning for trauma. Superimposed infection not excluded. Correlate with urinalysis. 2. Markedly asymmetric, hypodense, and heterogeneous right gluteal musculature compared to the left. Findings suggestive of an underlying hematoma formation. Mass is not excluded. 3. Fat stranding noted within the left upper quadrant and surrounding the left kidney. 4.  Aortic Atherosclerosis (ICD10-I70.0). 5.  Markedly limited evaluation on this noncontrast study. Recommend repeat trauma protocol with intravenous contrast (including a delayed phase of the abdomen and pelvis). Electronically Signed: By: Tish Frederickson M.D. On: 01/27/2021 23:09   DG Ribs Unilateral W/Chest Right  Result Date: 01/26/2021 CLINICAL DATA:  Status post fall. EXAM: RIGHT RIBS AND CHEST - 3+ VIEW COMPARISON:  None. FINDINGS: No acute displaced right rib fracture or other bone lesions are seen involving the ribs. The heart and mediastinal contours are within normal limits. No focal consolidation. No pulmonary edema. No pleural effusion. No pneumothorax. No acute osseous abnormality. IMPRESSION: 1. No acute displaced right rib fracture. Please note, nondisplaced rib fractures may be occult on radiograph. 2. No acute cardiopulmonary abnormality. Electronically Signed   By: Tish Frederickson M.D.   On: 01/26/2021 17:15   DG Ankle Complete Left  Result Date: 01/26/2021 CLINICAL DATA:  Chills and cough.  Status post fall. EXAM: LEFT ANKLE COMPLETE - 3+ VIEW COMPARISON:  None. FINDINGS: There is no evidence of fracture, dislocation, or joint effusion. There is no evidence of arthropathy or other focal bone abnormality. Soft tissues are unremarkable. IMPRESSION: Negative. Electronically Signed   By: Signa Kell M.D.   On: 01/26/2021 17:10   DG Ankle Complete Right  Result Date: 01/26/2021 CLINICAL DATA:  Chills and cough, fall. EXAM: RIGHT ANKLE - COMPLETE 3+ VIEW COMPARISON:  None. FINDINGS: There is no evidence of fracture, dislocation, or joint effusion. There is no evidence of arthropathy or other focal bone abnormality. Soft tissues are unremarkable. IMPRESSION: Negative. Electronically Signed   By: Darliss Cheney M.D.   On: 01/26/2021 17:09   CT HEAD WO CONTRAST ( )  Result Date: 01/26/2021 CLINICAL DATA:  Head trauma, mod-severe; Neck trauma, intoxicated or obtunded (Age >= 16y). EtOH on board, syncope/fall and hit head. EXAM: CT  HEAD WITHOUT CONTRAST CT CERVICAL SPINE WITHOUT CONTRAST TECHNIQUE: Multidetector CT imaging of the head and cervical spine was performed following the standard protocol without intravenous contrast. Multiplanar CT image reconstructions of the cervical spine were also generated. COMPARISON:  09/03/2020 FINDINGS: CT HEAD FINDINGS Brain: There is no evidence of an acute infarct, intracranial hemorrhage, mass, midline shift, or extra-axial fluid collection. The ventricles and sulci are normal. Vascular: No hyperdense vessel. Skull: No fracture or suspicious osseous lesion. Sinuses/Orbits: Visualized paranasal sinuses and mastoid air cells are clear. Unremarkable orbits. Other: None. CT CERVICAL SPINE FINDINGS Alignment: Straightening of the normal cervical lordosis. Chronic trace anterolisthesis of C4 on C5. Skull base and vertebrae: No acute fracture or suspicious osseous lesion. Soft tissues and spinal canal: No prevertebral fluid or swelling. No visible canal hematoma. Disc levels: Similar  appearance of multilevel disc degeneration, greatest at C5-6 and C6-7 with uncovertebral spurring resulting in moderate bilateral neural foraminal stenosis. Upper chest: Clear lung apices. Other: None. IMPRESSION: 1. No evidence of acute intracranial abnormality. 2. No acute cervical spine fracture. Electronically Signed   By: Sebastian Ache M.D.   On: 01/26/2021 17:50   CT Cervical Spine Wo Contrast  Result Date: 01/26/2021 CLINICAL DATA:  Head trauma, mod-severe; Neck trauma, intoxicated or obtunded (Age >= 16y). EtOH on board, syncope/fall and hit head. EXAM: CT HEAD WITHOUT CONTRAST CT CERVICAL SPINE WITHOUT CONTRAST TECHNIQUE: Multidetector CT imaging of the head and cervical spine was performed following the standard protocol without intravenous contrast. Multiplanar CT image reconstructions of the cervical spine were also generated. COMPARISON:  09/03/2020 FINDINGS: CT HEAD FINDINGS Brain: There is no evidence of an acute  infarct, intracranial hemorrhage, mass, midline shift, or extra-axial fluid collection. The ventricles and sulci are normal. Vascular: No hyperdense vessel. Skull: No fracture or suspicious osseous lesion. Sinuses/Orbits: Visualized paranasal sinuses and mastoid air cells are clear. Unremarkable orbits. Other: None. CT CERVICAL SPINE FINDINGS Alignment: Straightening of the normal cervical lordosis. Chronic trace anterolisthesis of C4 on C5. Skull base and vertebrae: No acute fracture or suspicious osseous lesion. Soft tissues and spinal canal: No prevertebral fluid or swelling. No visible canal hematoma. Disc levels: Similar appearance of multilevel disc degeneration, greatest at C5-6 and C6-7 with uncovertebral spurring resulting in moderate bilateral neural foraminal stenosis. Upper chest: Clear lung apices. Other: None. IMPRESSION: 1. No evidence of acute intracranial abnormality. 2. No acute cervical spine fracture. Electronically Signed   By: Sebastian Ache M.D.   On: 01/26/2021 17:50   CT PELVIS WO CONTRAST  Result Date: 01/27/2021 CLINICAL DATA:  Pelvic trauma, right lower extremity numbness, right hip pain EXAM: CT PELVIS WITHOUT CONTRAST TECHNIQUE: Multidetector CT imaging of the pelvis was performed following the standard protocol without intravenous contrast. COMPARISON:  Pelvis radiographs 12/21/2013 FINDINGS: Urinary Tract:  No abnormality visualized. Bowel: There is no abnormal bowel wall thickening or inflammatory change the level imaged. Vascular/Lymphatic: The vasculature is unremarkable within the confines of noncontrast technique. There is no lymphadenopathy in the pelvis. Reproductive:  The prostate and seminal vesicles are unremarkable. Other: There is blood in the presacral space and right pericolic gutter. There is no free air to the level imaged. Musculoskeletal: There is an intramuscular hematoma in the right gluteus musculature with musculotendinous injury with probable involvement of at  least the right gluteus medius and piriformis muscles. No fracture is identified. IMPRESSION: 1. Hemoperitoneum, incompletely evaluated. No free air is seen in the pelvis. Recommend CT of the abdomen and pelvis with IV contrast for further evaluation. 2. S on the right uspected musculotendinous injuries with probable involvement of at least the right gluteus medius and piriformis muscles. 3. No fracture is identified. Electronically Signed   By: Lesia Hausen M.D.   On: 01/27/2021 14:01   MR BRAIN WO CONTRAST  Result Date: 01/27/2021 CLINICAL DATA:  Neuro deficit, stroke suspected EXAM: MRI HEAD WITHOUT CONTRAST TECHNIQUE: Multiplanar, multiecho pulse sequences of the brain and surrounding structures were obtained without intravenous contrast. COMPARISON:  No prior MRI correlation is made with 01/26/2021 CT brain. FINDINGS: Brain: Evaluation somewhat limited by motion artifact. No acute infarction, hemorrhage, hydrocephalus, mass, mass effect, or midline shift. Scattered T2 hyperintense signal in the periventricular white matter, likely the sequela of chronic small vessel ischemic disease. Vascular: Normal flow voids. Skull and upper cervical spine: Normal marrow signal. Sinuses/Orbits:  Imaged sinuses are clear. The orbits are unremarkable. Other: Trace fluid in mastoid air cells. IMPRESSION: No acute intracranial process. Electronically Signed   By: Wiliam Ke M.D.   On: 01/27/2021 12:23   MR PELVIS WO CONTRAST  Result Date: 01/27/2021 CLINICAL DATA:  Unknown trauma.  Found down.  Abnormal CT scan. EXAM: MRI PELVIS WITHOUT CONTRAST, MRI RIGHT FEMUR WITHOUT CONTRAST TECHNIQUE: Multiplanar multisequence MR imaging of the pelvis was performed. No intravenous contrast was administered. COMPARISON:  CT scan, same date. FINDINGS: Significant trauma involving the right sided pelvic and hip musculature with marked increased T2 signal intensity most notably in the right gluteal muscles. Large intramuscular  hematoma in the gluteus minimus muscle. Grade 2 tearing of the gluteus maximus muscle inferiorly along the musculotendinous junction. High-grade partial tearing of the quadratus femoris muscle and the external portion of the obturator internus muscle. Both hips are normally located. No hip or pelvic fractures are identified. The pubic symphysis and SI joints are intact. The hamstring tendons are intact. Free fluid noted in the lower abdomen and pelvis but no discrete intrapelvic hematoma. Bladder is grossly normal. Prostate gland and seminal vesicles are unremarkable. IMPRESSION: 1. Significant trauma involving the right sided pelvic and hip musculature with extensive muscle tears and intramuscular and interfascial hemorrhage, edema and fluid. 2. No fractures are identified. Electronically Signed   By: Rudie Meyer M.D.   On: 01/27/2021 20:16   US RENAL  Result Date: 01/26/2021 CLINICAL DATA:  Acute kidney injury EXAM: RENAL / URINARY TRACT ULTRASOUND COMPLETE COMPARISON:  None. FINDINGS: Right Kidney: Renal measurements: 10.4 x 4.6 x 4.2 cm = volume: 105.3 mL. Cortex appears echogenic. No mass or hydronephrosis. Left Kidney: Renal measurements: 8.8 x 5.1 x 3.5 cm = volume: 81.3 mL. Cortex appears slightly echogenic. No mass or hydronephrosis Bladder: Debris in the urinary bladder Other: None. IMPRESSION: 1. Kidneys appear slightly echogenic suggesting medical renal disease. No hydronephrosis 2. Debris in the urinary bladder Electronically Signed   By: Jasmine Pang M.D.   On: 01/26/2021 23:30   MR VQQVZ RIGHT WO CONTRAST  Result Date: 01/27/2021 CLINICAL DATA:  Unknown trauma.  Found down.  Abnormal CT scan. EXAM: MRI PELVIS WITHOUT CONTRAST, MRI RIGHT FEMUR WITHOUT CONTRAST TECHNIQUE: Multiplanar multisequence MR imaging of the pelvis was performed. No intravenous contrast was administered. COMPARISON:  CT scan, same date. FINDINGS: Significant trauma involving the right sided pelvic and hip musculature  with marked increased T2 signal intensity most notably in the right gluteal muscles. Large intramuscular hematoma in the gluteus minimus muscle. Grade 2 tearing of the gluteus maximus muscle inferiorly along the musculotendinous junction. High-grade partial tearing of the quadratus femoris muscle and the external portion of the obturator internus muscle. Both hips are normally located. No hip or pelvic fractures are identified. The pubic symphysis and SI joints are intact. The hamstring tendons are intact. Free fluid noted in the lower abdomen and pelvis but no discrete intrapelvic hematoma. Bladder is grossly normal. Prostate gland and seminal vesicles are unremarkable. IMPRESSION: 1. Significant trauma involving the right sided pelvic and hip musculature with extensive muscle tears and intramuscular and interfascial hemorrhage, edema and fluid. 2. No fractures are identified. Electronically Signed   By: Rudie Meyer M.D.   On: 01/27/2021 20:16   DG Hip Unilat W or Wo Pelvis 2-3 Views Right  Result Date: 01/26/2021 CLINICAL DATA:  Status post fall. EXAM: DG HIP (WITH OR WITHOUT PELVIS) 2-3V RIGHT COMPARISON:  01/29/2009 FINDINGS: There is no evidence  of hip fracture or dislocation. There is no evidence of arthropathy or other focal bone abnormality. IMPRESSION: Negative. Electronically Signed   By: Signa Kell M.D.   On: 01/26/2021 17:08   VAS [place] LOWER EXTREMITY VENOUS (DVT)  Result Date: 01/27/2021  Lower Venous DVT Study Patient Name:  MAUDE GLOOR  Date of Exam:   01/26/2021 Medical Rec #: [id number]        Accession #:    [id number] Date of Birth: 07-14-1970        Patient Gender: M Patient Age:   52 years Exam Location:  [facility] Procedure:      VAS [place] LOWER EXTREMITY VENOUS (DVT) Referring Phys: Lynden Oxford --------------------------------------------------------------------------------  Indications: Pain s/p fall.  Comparison Study: No prior studies. Performing Technologist:  Jean Rosenthal RDMS, RVT  Examination Guidelines: A complete evaluation includes B-mode imaging, spectral Doppler, color Doppler, and power Doppler as needed of all accessible portions of each vessel. Bilateral testing is considered an integral part of a complete examination. Limited examinations for reoccurring indications may be performed as noted. The reflux portion of the exam is performed with the patient in reverse Trendelenburg.  +---------+---------------+---------+-----------+----------+--------------+ RIGHT    CompressibilityPhasicitySpontaneityPropertiesThrombus Aging +---------+---------------+---------+-----------+----------+--------------+ CFV      Full           Yes      Yes                                 +---------+---------------+---------+-----------+----------+--------------+ SFJ      Full                                                        +---------+---------------+---------+-----------+----------+--------------+ FV Prox  Full                                                        +---------+---------------+---------+-----------+----------+--------------+ FV Mid   Full                                                        +---------+---------------+---------+-----------+----------+--------------+ FV DistalFull                                                        +---------+---------------+---------+-----------+----------+--------------+ PFV      Full                                                        +---------+---------------+---------+-----------+----------+--------------+ POP      Full           Yes      Yes                                 +---------+---------------+---------+-----------+----------+--------------+  PTV      Full                                                        +---------+---------------+---------+-----------+----------+--------------+ PERO     Full                                                         +---------+---------------+---------+-----------+----------+--------------+ Gastroc  Full                                                        +---------+---------------+---------+-----------+----------+--------------+     Summary: RIGHT: - There is no evidence of deep vein thrombosis in the lower extremity.  - No cystic structure found in the popliteal fossa.   *See table(s) above for measurements and observations. Electronically signed by Waverly Ferrari MD on 01/27/2021 at 6:44:38 AM.    Final    US Abdomen Limited RUQ (LIVER/GB)  Result Date: 01/26/2021 CLINICAL DATA:  Right upper quadrant pain. EXAM: ULTRASOUND ABDOMEN LIMITED RIGHT UPPER QUADRANT COMPARISON:  CT chest abdomen and pelvis 04/04/2009 report only. FINDINGS: Gallbladder: No gallstones or wall thickening visualized. Gallbladder sludge is present. No sonographic Murphy sign noted by sonographer. Common bile duct: Diameter: 3.7 mm. Liver: No focal lesion identified. Within normal limits in parenchymal echogenicity. Portal vein is patent on color Doppler imaging with normal direction of blood flow towards the liver. Other: None. IMPRESSION: 1. Gallbladder sludge. No additional sonographic evidence for cholelithiasis or acute cholecystitis. Electronically Signed   By: Darliss Cheney M.D.   On: 01/26/2021 18:55    Review of Systems  Constitutional: Negative.   HENT: Negative.    Eyes: Negative.   Respiratory: Negative.    Cardiovascular: Negative.   Gastrointestinal:  Positive for abdominal pain, nausea and vomiting.  Endocrine: Negative.   Genitourinary:  Positive for flank pain.  Musculoskeletal:        See HPI  Skin: Negative.   Allergic/Immunologic: Negative.   Neurological: Negative.   Hematological: Negative.   Psychiatric/Behavioral: Negative.    Blood pressure (!) 147/87, pulse 92, temperature 98.8 F (37.1 C), temperature source Oral, resp. rate 16, height 5\' 8"  (1.727 m), weight 64.4 kg, SpO2 98  %. Physical Exam Constitutional:      General: He is not in acute distress. HENT:     Head: Normocephalic.     Right Ear: External ear normal.     Left Ear: External ear normal.     Nose: Nose normal.     Mouth/Throat:     Mouth: Mucous membranes are moist.  Eyes:     General: No scleral icterus.    Pupils: Pupils are equal, round, and reactive to light.  Cardiovascular:     Rate and Rhythm: Normal rate and regular rhythm.     Pulses: Normal pulses.     Heart sounds: Normal heart sounds.  Pulmonary:     Effort: Pulmonary effort is normal.     Breath sounds: Normal breath sounds.  Abdominal:     General: Abdomen is flat. There is no distension.     Palpations: Abdomen is soft. There is no mass.     Tenderness: There is abdominal tenderness. There is no guarding or rebound.     Comments: Mild intermittent abdominal tenderness suprapubic and right lateral, no peritonitis  Musculoskeletal:     Cervical back: Normal range of motion and neck supple. No tenderness.     Comments: Tender moderate fullness right buttock and hip area  Skin:    General: Skin is dry.     Capillary Refill: Capillary refill takes 2 to 3 seconds.  Neurological:     Mental Status: He is alert.     Comments: GCS 15 Decreased light touch sensation right foot   Psychiatric:        Mood and Affect: Mood normal.    Assessment/Plan: Status post fall onto right side, likely down for a period of time Buttock and hip muscular injury and rhabdomyolysis causing acute kidney injury - Ortho consult noted, primary team treating AKI/rhabdo Right perinephric retroperitoneal hematoma with left perinephric stranding - retroperitoneal contusion.  No large renal injury seen on noncontrast study.  This should resolve without any further treatment.  In light of patient's mild abdominal tenderness, we will follow up again tomorrow.  Continue diet. ETOH and cocaine abuse  Frank [name] 01/28/2021, 9:56 AM

## 2021-01-28 NOTE — Progress Notes (Addendum)
PROGRESS NOTE    Frank [name]  KXF:[id number] DOB: 1970/07/27 DOA: 01/26/2021 PCP: Pcp, No    No chief complaint on file.   Brief Narrative:  50 yo AA male with hx of alcohol abuse, cocaine abuse, and THC use who presents after Frank [name] fall.  Reportedly smelled of etoh and was slurring speech with EMS.  Initially hypotenesive, but this has improved.    Imaging without acute findings.  He's been admitted with acute kidney injury and elevated LFT's in the setting of alcohol abuse.  He's complaining of right sided numbness and imaging is currently pending.   Assessment & Plan:   Principal Problem:   AKI (acute kidney injury) (HCC) Active Problems:   Elevated LFTs   Alcohol abuse   Polysubstance abuse (HCC)   Diarrhea  Rhabdomyolysis  Acute Kidney Injury Related to cocaine use/trauma  CK > 50,000 Continue aggressive IVF [place] without hydro  UA with protein, 6-10 RBC Urine myoglobin pending He refused foley catheter placement for I/O monitoring  Creatinine continuing to rise today Will consult renal as well   Right Lower Extremity Numbness  Right Sided Hip and Pelvic Musculature Trauma  Intramuscular and Interfascial Hemorrhage, edema, and fluid  Mechanical Fall Appreciate orthopedic eval, no evidence of impending compartment syndrome - > symptoms thought related to 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 No motor deficits on exam, per ortho, sensation likely to return with time MRI brain without acute intracranial process  Hemoperitoneum CT abd/pelvis concerning for trauma - perinephric stranding around R kidney with small volume hemorrhage within retroperitoneum, asymmetric, hypodense, and heterogeneous R gluteal musculature Appreciate surgery eval and assistance  Alcohol Abuse  CIWA protocol Encourage cessation  Elevated LFT's 2/2 etoh abuse and rhabdo, trend RUQ [place] notable for sludge Pending hep C ab - negative HIV, hep Steffie Waggoner,  B  Diarrhea Follow GI panel HIV, acute hepatitis panel pending  Polysubstance abuse Encourage cessation  DVT prophylaxis: (heparin Code Status: full Family Communication: none at bedside Disposition:   Status is: inpatient  The patient will require care spanning > 2 midnights and should be moved to inpatient because: Inpatient level of care appropriate due to severity of illness  Dispo: The patient is from: Home              Anticipated d/c is to:  pending              Patient currently is not medically stable to d/c.   Difficult to place patient No       Consultants:  none  Procedures:  none  Antimicrobials:  Anti-infectives (From admission, onward)    None          Subjective: Continued RLE numbness  Objective: Vitals:   01/27/21 2300 01/28/21 0300 01/28/21 0500 01/28/21 0742  BP: (!) 150/106 (!) 142/89  (!) 147/87  Pulse: 90 88  92  Resp: 20 20  16   Temp: 98.5 F (36.9 C) 98.5 F (36.9 C)  98.8 F (37.1 C)  TempSrc: Oral Oral  Oral  SpO2: 100% 97%  98%  Weight:   64.4 kg   Height:        Intake/Output Summary (Last 24 hours) at 01/28/2021 1002 Last data filed at 01/28/2021 0500 Gross per 24 hour  Intake 6922.01 ml  Output --  Net 6922.01 ml   Filed Weights   01/27/21 2200 01/28/21 0500  Weight: 62 kg 64.4 kg    Examination:  General: No acute  distress. Cardiovascular:RRR Lungs: unlabored Abdomen: soft, tender to palpation with guarding Neurological: Alert and oriented 3. Moves all extremities 4. Cranial nerves II through XII grossly intact. Skin: Warm and dry. No rashes or lesions. Extremities: No clubbing or cyanosis. No edema.      Data Reviewed: I have personally reviewed following labs and imaging studies  CBC: Recent Labs  Lab 01/26/21 1600 01/27/21 1711 01/28/21 0804  WBC 22.0* 14.4* 12.9*  NEUTROABS 19.5* 12.2* 10.6*  HGB 16.2 11.8* 10.7*  HCT 47.0 33.4* 29.5*  MCV 99.8 97.4 94.9  PLT 298 210 198    Basic  Metabolic Panel: Recent Labs  Lab 01/26/21 1600 01/27/21 1711 01/28/21 0804  NA 136 129* 132*  K 4.9 4.5 4.3  CL 96* 99 103  CO2 20* 18* 17*  GLUCOSE 136* 127* 90  BUN 39* 61* 67*  CREATININE 3.86* 5.60* 7.05*  CALCIUM 8.0* 6.4* 6.7*  MG  --  2.0  --     GFR: Estimated Creatinine Clearance: 11.2 mL/min (Frank [name]) (by C-G formula based on SCr of 7.05 mg/dL (H)).  Liver Function Tests: Recent Labs  Lab 01/26/21 1600 01/27/21 1711 01/28/21 0804  AST 1,140* 1,001* 899*  ALT 392* 373* 356*  ALKPHOS 64 54 55  BILITOT 0.8 0.6 0.8  PROT 8.0 5.4* 5.1*  ALBUMIN 4.4 2.9* 2.6*    CBG: No results for input(s): GLUCAP in the last 168 hours.   Recent Results (from the past 240 hour(s))  Resp Panel by RT-PCR (Flu Chauncy [name]&B, Covid) Nasopharyngeal Swab     Status: None   Collection Time: 01/26/21  4:06 PM   Specimen: Nasopharyngeal Swab; Nasopharyngeal(NP) swabs in vial transport medium  Result Value Ref Range Status   SARS Coronavirus 2 by RT PCR NEGATIVE NEGATIVE Final    Comment: (NOTE) SARS-CoV-2 target nucleic acids are NOT DETECTED.  The SARS-CoV-2 RNA is generally detectable in upper respiratory specimens during the acute phase of infection. The lowest concentration of SARS-CoV-2 viral copies this assay can detect is 138 copies/mL. Frank [name] negative result does not preclude SARS-Cov-2 infection and should not be used as the sole basis for treatment or other patient management decisions. Frank [name] negative result may occur with  improper specimen collection/handling, submission of specimen other than nasopharyngeal swab, presence of viral mutation(s) within the areas targeted by this assay, and inadequate number of viral copies(<138 copies/mL). Frank [name] negative result must be combined with clinical observations, patient history, and epidemiological information. The expected result is Negative.  Fact Sheet for Patients:  BloggerCourse.com  Fact Sheet for Healthcare Providers:   SeriousBroker.it  This test is no t yet approved or cleared by the [place] FDA and  has been authorized for detection and/or diagnosis of SARS-CoV-2 by FDA under an Emergency Use Authorization (EUA). This EUA will remain  in effect (meaning this test can be used) for the duration of the COVID-19 declaration under Section 564(b)(1) of the Act, 21 U.S.C.section 360bbb-3(b)(1), unless the authorization is terminated  or revoked sooner.       Influenza Chevy Virgo by PCR NEGATIVE NEGATIVE Final   Influenza B by PCR NEGATIVE NEGATIVE Final    Comment: (NOTE) The Xpert Xpress SARS-CoV-2/FLU/RSV plus assay is intended as an aid in the diagnosis of influenza from Nasopharyngeal swab specimens and should not be used as Xenia Nile sole basis for treatment. Nasal washings and aspirates are unacceptable for Xpert Xpress SARS-CoV-2/FLU/RSV testing.  Fact Sheet for Patients: BloggerCourse.com  Fact Sheet for Healthcare Providers: SeriousBroker.it  This test is  not yet approved or cleared by the [place] and has been authorized for detection and/or diagnosis of SARS-CoV-2 by FDA under an Emergency Use Authorization (EUA). This EUA will remain in effect (meaning this test can be used) for the duration of the COVID-19 declaration under Section 564(b)(1) of the Act, 21 U.S.C. section 360bbb-3(b)(1), unless the authorization is terminated or revoked.  Performed at [facility], [address] [place], [place] [address]          Radiology Studies: CT ABDOMEN WO CONTRAST  Addendum Date: 01/27/2021   ADDENDUM REPORT: 01/27/2021 23:26 ADDENDUM: These results were called by telephone at the time of interpretation on 01/27/2021 at 11:23 pm to provider Dr. Rachael Darby, who verbally acknowledged these results. Electronically Signed   By: Tish Frederickson M.D.   On: 01/27/2021 23:26   Result Date: 01/27/2021 CLINICAL DATA:   Hemoperitoneum. stumble at gas station and fall into bushes, EXAM: CT ABDOMEN AND PELVS WITHOUT CONTRAST TECHNIQUE: Multidetector CT imaging of the abdomen was performed following the standard protocol without IV contrast. COMPARISON:  X-ray chest 01/26/2021, CT pelvis 01/27/2021 FINDINGS: Lower abdomen: No acute abnormality. Liver: Not enlarged. No focal lesion. Biliary System: The gallbladder is otherwise unremarkable with no radio-opaque gallstones. No biliary ductal dilatation. Pancreas: Normal pancreatic contour. No main pancreatic duct dilatation. Spleen: Not enlarged. No focal lesion. Adrenal Glands: No nodularity bilaterally. Kidneys: Marked perinephric stranding surrounding the right kidney with free fluid coursing inferiorly along the right pericolic gutter. No hydroureteronephrosis. No nephroureterolithiasis. No contour deforming renal mass. The urinary bladder is unremarkable. Bowel: No small or large bowel wall thickening or dilatation. The appendix is unremarkable. Mesentery, Omentum, and Peritoneum: High density trace to small volume abdominal and pelvic free fluid. Fat stranding noted within the left upper quadrant and surrounding the left kidney. No pneumoperitoneum. No mesenteric hematoma identified. No organized fluid collection. Pelvic Organs: Normal. Lymph Nodes: No abdominal, pelvic, inguinal lymphadenopathy. Vasculature: Atherosclerotic plaque. No abdominal aorta or iliac aneurysm. Musculoskeletal: Markedly asymmetric, hypodense, and heterogeneous right gluteal musculature compared to the left. Associated overlying subcutaneus soft tissue edema. Tiny fat containing umbilical hernia. No suspicious lytic or blastic osseous lesions. No acute displaced fracture. Multilevel degenerative changes of the spine. IMPRESSION: 1. Marked perinephric stranding surrounding the right kidney with at least small volume hemorrhage within the retroperitoneum. Findings concerning for trauma. Superimposed  infection not excluded. Correlate with urinalysis. 2. Markedly asymmetric, hypodense, and heterogeneous right gluteal musculature compared to the left. Findings suggestive of an underlying hematoma formation. Mass is not excluded. 3. Fat stranding noted within the left upper quadrant and surrounding the left kidney. 4.  Aortic Atherosclerosis (ICD10-I70.0). 5. Markedly limited evaluation on this noncontrast study. Recommend repeat trauma protocol with intravenous contrast (including Nnaemeka Samson delayed phase of the abdomen and pelvis). Electronically Signed: By: Tish Frederickson M.D. On: 01/27/2021 23:09   DG Ribs Unilateral W/Chest Right  Result Date: 01/26/2021 CLINICAL DATA:  Status post fall. EXAM: RIGHT RIBS AND CHEST - 3+ VIEW COMPARISON:  None. FINDINGS: No acute displaced right rib fracture or other bone lesions are seen involving the ribs. The heart and mediastinal contours are within normal limits. No focal consolidation. No pulmonary edema. No pleural effusion. No pneumothorax. No acute osseous abnormality. IMPRESSION: 1. No acute displaced right rib fracture. Please note, nondisplaced rib fractures may be occult on radiograph. 2. No acute cardiopulmonary abnormality. Electronically Signed   By: Tish Frederickson M.D.   On: 01/26/2021 17:15   DG Ankle Complete  Left  Result Date: 01/26/2021 CLINICAL DATA:  Chills and cough.  Status post fall. EXAM: LEFT ANKLE COMPLETE - 3+ VIEW COMPARISON:  None. FINDINGS: There is no evidence of fracture, dislocation, or joint effusion. There is no evidence of arthropathy or other focal bone abnormality. Soft tissues are unremarkable. IMPRESSION: Negative. Electronically Signed   By: Signa Kell M.D.   On: 01/26/2021 17:10   DG Ankle Complete Right  Result Date: 01/26/2021 CLINICAL DATA:  Chills and cough, fall. EXAM: RIGHT ANKLE - COMPLETE 3+ VIEW COMPARISON:  None. FINDINGS: There is no evidence of fracture, dislocation, or joint effusion. There is no evidence of  arthropathy or other focal bone abnormality. Soft tissues are unremarkable. IMPRESSION: Negative. Electronically Signed   By: Darliss Cheney M.D.   On: 01/26/2021 17:09   CT HEAD WO CONTRAST ( )  Result Date: 01/26/2021 CLINICAL DATA:  Head trauma, mod-severe; Neck trauma, intoxicated or obtunded (Age >= 16y). EtOH on board, syncope/fall and hit head. EXAM: CT HEAD WITHOUT CONTRAST CT CERVICAL SPINE WITHOUT CONTRAST TECHNIQUE: Multidetector CT imaging of the head and cervical spine was performed following the standard protocol without intravenous contrast. Multiplanar CT image reconstructions of the cervical spine were also generated. COMPARISON:  09/03/2020 FINDINGS: CT HEAD FINDINGS Brain: There is no evidence of an acute infarct, intracranial hemorrhage, mass, midline shift, or extra-axial fluid collection. The ventricles and sulci are normal. Vascular: No hyperdense vessel. Skull: No fracture or suspicious osseous lesion. Sinuses/Orbits: Visualized paranasal sinuses and mastoid air cells are clear. Unremarkable orbits. Other: None. CT CERVICAL SPINE FINDINGS Alignment: Straightening of the normal cervical lordosis. Chronic trace anterolisthesis of C4 on C5. Skull base and vertebrae: No acute fracture or suspicious osseous lesion. Soft tissues and spinal canal: No prevertebral fluid or swelling. No visible canal hematoma. Disc levels: Similar appearance of multilevel disc degeneration, greatest at C5-6 and C6-7 with uncovertebral spurring resulting in moderate bilateral neural foraminal stenosis. Upper chest: Clear lung apices. Other: None. IMPRESSION: 1. No evidence of acute intracranial abnormality. 2. No acute cervical spine fracture. Electronically Signed   By: Sebastian Ache M.D.   On: 01/26/2021 17:50   CT Cervical Spine Wo Contrast  Result Date: 01/26/2021 CLINICAL DATA:  Head trauma, mod-severe; Neck trauma, intoxicated or obtunded (Age >= 16y). EtOH on board, syncope/fall and hit head. EXAM: CT  HEAD WITHOUT CONTRAST CT CERVICAL SPINE WITHOUT CONTRAST TECHNIQUE: Multidetector CT imaging of the head and cervical spine was performed following the standard protocol without intravenous contrast. Multiplanar CT image reconstructions of the cervical spine were also generated. COMPARISON:  09/03/2020 FINDINGS: CT HEAD FINDINGS Brain: There is no evidence of an acute infarct, intracranial hemorrhage, mass, midline shift, or extra-axial fluid collection. The ventricles and sulci are normal. Vascular: No hyperdense vessel. Skull: No fracture or suspicious osseous lesion. Sinuses/Orbits: Visualized paranasal sinuses and mastoid air cells are clear. Unremarkable orbits. Other: None. CT CERVICAL SPINE FINDINGS Alignment: Straightening of the normal cervical lordosis. Chronic trace anterolisthesis of C4 on C5. Skull base and vertebrae: No acute fracture or suspicious osseous lesion. Soft tissues and spinal canal: No prevertebral fluid or swelling. No visible canal hematoma. Disc levels: Similar appearance of multilevel disc degeneration, greatest at C5-6 and C6-7 with uncovertebral spurring resulting in moderate bilateral neural foraminal stenosis. Upper chest: Clear lung apices. Other: None. IMPRESSION: 1. No evidence of acute intracranial abnormality. 2. No acute cervical spine fracture. Electronically Signed   By: Sebastian Ache M.D.   On: 01/26/2021 17:50   CT PELVIS  WO CONTRAST  Result Date: 01/27/2021 CLINICAL DATA:  Pelvic trauma, right lower extremity numbness, right hip pain EXAM: CT PELVIS WITHOUT CONTRAST TECHNIQUE: Multidetector CT imaging of the pelvis was performed following the standard protocol without intravenous contrast. COMPARISON:  Pelvis radiographs 12/21/2013 FINDINGS: Urinary Tract:  No abnormality visualized. Bowel: There is no abnormal bowel wall thickening or inflammatory change the level imaged. Vascular/Lymphatic: The vasculature is unremarkable within the confines of noncontrast technique.  There is no lymphadenopathy in the pelvis. Reproductive:  The prostate and seminal vesicles are unremarkable. Other: There is blood in the presacral space and right pericolic gutter. There is no free air to the level imaged. Musculoskeletal: There is an intramuscular hematoma in the right gluteus musculature with musculotendinous injury with probable involvement of at least the right gluteus medius and piriformis muscles. No fracture is identified. IMPRESSION: 1. Hemoperitoneum, incompletely evaluated. No free air is seen in the pelvis. Recommend CT of the abdomen and pelvis with IV contrast for further evaluation. 2. S on the right uspected musculotendinous injuries with probable involvement of at least the right gluteus medius and piriformis muscles. 3. No fracture is identified. Electronically Signed   By: Lesia Hausen M.D.   On: 01/27/2021 14:01   MR BRAIN WO CONTRAST  Result Date: 01/27/2021 CLINICAL DATA:  Neuro deficit, stroke suspected EXAM: MRI HEAD WITHOUT CONTRAST TECHNIQUE: Multiplanar, multiecho pulse sequences of the brain and surrounding structures were obtained without intravenous contrast. COMPARISON:  No prior MRI correlation is made with 01/26/2021 CT brain. FINDINGS: Brain: Evaluation somewhat limited by motion artifact. No acute infarction, hemorrhage, hydrocephalus, mass, mass effect, or midline shift. Scattered T2 hyperintense signal in the periventricular white matter, likely the sequela of chronic small vessel ischemic disease. Vascular: Normal flow voids. Skull and upper cervical spine: Normal marrow signal. Sinuses/Orbits: Imaged sinuses are clear. The orbits are unremarkable. Other: Trace fluid in mastoid air cells. IMPRESSION: No acute intracranial process. Electronically Signed   By: Wiliam Ke M.D.   On: 01/27/2021 12:23   MR PELVIS WO CONTRAST  Result Date: 01/27/2021 CLINICAL DATA:  Unknown trauma.  Found down.  Abnormal CT scan. EXAM: MRI PELVIS WITHOUT CONTRAST, MRI  RIGHT FEMUR WITHOUT CONTRAST TECHNIQUE: Multiplanar multisequence MR imaging of the pelvis was performed. No intravenous contrast was administered. COMPARISON:  CT scan, same date. FINDINGS: Significant trauma involving the right sided pelvic and hip musculature with marked increased T2 signal intensity most notably in the right gluteal muscles. Large intramuscular hematoma in the gluteus minimus muscle. Grade 2 tearing of the gluteus maximus muscle inferiorly along the musculotendinous junction. High-grade partial tearing of the quadratus femoris muscle and the external portion of the obturator internus muscle. Both hips are normally located. No hip or pelvic fractures are identified. The pubic symphysis and SI joints are intact. The hamstring tendons are intact. Free fluid noted in the lower abdomen and pelvis but no discrete intrapelvic hematoma. Bladder is grossly normal. Prostate gland and seminal vesicles are unremarkable. IMPRESSION: 1. Significant trauma involving the right sided pelvic and hip musculature with extensive muscle tears and intramuscular and interfascial hemorrhage, edema and fluid. 2. No fractures are identified. Electronically Signed   By: Rudie Meyer M.D.   On: 01/27/2021 20:16   US RENAL  Result Date: 01/26/2021 CLINICAL DATA:  Acute kidney injury EXAM: RENAL / URINARY TRACT ULTRASOUND COMPLETE COMPARISON:  None. FINDINGS: Right Kidney: Renal measurements: 10.4 x 4.6 x 4.2 cm = volume: 105.3 mL. Cortex appears echogenic. No mass or hydronephrosis.  Left Kidney: Renal measurements: 8.8 x 5.1 x 3.5 cm = volume: 81.3 mL. Cortex appears slightly echogenic. No mass or hydronephrosis Bladder: Debris in the urinary bladder Other: None. IMPRESSION: 1. Kidneys appear slightly echogenic suggesting medical renal disease. No hydronephrosis 2. Debris in the urinary bladder Electronically Signed   By: Jasmine Pang M.D.   On: 01/26/2021 23:30   MR ZOXWR RIGHT WO CONTRAST  Result Date:  01/27/2021 CLINICAL DATA:  Unknown trauma.  Found down.  Abnormal CT scan. EXAM: MRI PELVIS WITHOUT CONTRAST, MRI RIGHT FEMUR WITHOUT CONTRAST TECHNIQUE: Multiplanar multisequence MR imaging of the pelvis was performed. No intravenous contrast was administered. COMPARISON:  CT scan, same date. FINDINGS: Significant trauma involving the right sided pelvic and hip musculature with marked increased T2 signal intensity most notably in the right gluteal muscles. Large intramuscular hematoma in the gluteus minimus muscle. Grade 2 tearing of the gluteus maximus muscle inferiorly along the musculotendinous junction. High-grade partial tearing of the quadratus femoris muscle and the external portion of the obturator internus muscle. Both hips are normally located. No hip or pelvic fractures are identified. The pubic symphysis and SI joints are intact. The hamstring tendons are intact. Free fluid noted in the lower abdomen and pelvis but no discrete intrapelvic hematoma. Bladder is grossly normal. Prostate gland and seminal vesicles are unremarkable. IMPRESSION: 1. Significant trauma involving the right sided pelvic and hip musculature with extensive muscle tears and intramuscular and interfascial hemorrhage, edema and fluid. 2. No fractures are identified. Electronically Signed   By: Rudie Meyer M.D.   On: 01/27/2021 20:16   DG Hip Unilat W or Wo Pelvis 2-3 Views Right  Result Date: 01/26/2021 CLINICAL DATA:  Status post fall. EXAM: DG HIP (WITH OR WITHOUT PELVIS) 2-3V RIGHT COMPARISON:  01/29/2009 FINDINGS: There is no evidence of hip fracture or dislocation. There is no evidence of arthropathy or other focal bone abnormality. IMPRESSION: Negative. Electronically Signed   By: Signa Kell M.D.   On: 01/26/2021 17:08   VAS [place] LOWER EXTREMITY VENOUS (DVT)  Result Date: 01/27/2021  Lower Venous DVT Study Patient Name:  MYCHEAL VELDHUIZEN  Date of Exam:   01/26/2021 Medical Rec #: [id number]        Accession #:     [id number] Date of Birth: 1971-02-12        Patient Gender: M Patient Age:   57 years Exam Location:  [facility] Procedure:      VAS [place] LOWER EXTREMITY VENOUS (DVT) Referring Phys: Lynden Oxford --------------------------------------------------------------------------------  Indications: Pain s/p fall.  Comparison Study: No prior studies. Performing Technologist: Jean Rosenthal RDMS, RVT  Examination Guidelines: Jaycub Noorani complete evaluation includes B-mode imaging, spectral Doppler, color Doppler, and power Doppler as needed of all accessible portions of each vessel. Bilateral testing is considered an integral part of Leightyn Cina complete examination. Limited examinations for reoccurring indications may be performed as noted. The reflux portion of the exam is performed with the patient in reverse Trendelenburg.  +---------+---------------+---------+-----------+----------+--------------+ RIGHT    CompressibilityPhasicitySpontaneityPropertiesThrombus Aging +---------+---------------+---------+-----------+----------+--------------+ CFV      Full           Yes      Yes                                 +---------+---------------+---------+-----------+----------+--------------+ SFJ      Full                                                        +---------+---------------+---------+-----------+----------+--------------+  FV Prox  Full                                                        +---------+---------------+---------+-----------+----------+--------------+ FV Mid   Full                                                        +---------+---------------+---------+-----------+----------+--------------+ FV DistalFull                                                        +---------+---------------+---------+-----------+----------+--------------+ PFV      Full                                                         +---------+---------------+---------+-----------+----------+--------------+ POP      Full           Yes      Yes                                 +---------+---------------+---------+-----------+----------+--------------+ PTV      Full                                                        +---------+---------------+---------+-----------+----------+--------------+ PERO     Full                                                        +---------+---------------+---------+-----------+----------+--------------+ Gastroc  Full                                                        +---------+---------------+---------+-----------+----------+--------------+     Summary: RIGHT: - There is no evidence of deep vein thrombosis in the lower extremity.  - No cystic structure found in the popliteal fossa.   *See table(s) above for measurements and observations. Electronically signed by Waverly Ferrari MD on 01/27/2021 at 6:44:38 AM.    Final    US Abdomen Limited RUQ (LIVER/GB)  Result Date: 01/26/2021 CLINICAL DATA:  Right upper quadrant pain. EXAM: ULTRASOUND ABDOMEN LIMITED RIGHT UPPER QUADRANT COMPARISON:  CT chest abdomen and pelvis 04/04/2009 report only. FINDINGS: Gallbladder: No gallstones or wall thickening visualized. Gallbladder sludge is present. No sonographic Murphy sign noted by sonographer. Common bile duct: Diameter: 3.7 mm. Liver: No  focal lesion identified. Within normal limits in parenchymal echogenicity. Portal vein is patent on color Doppler imaging with normal direction of blood flow towards the liver. Other: None. IMPRESSION: 1. Gallbladder sludge. No additional sonographic evidence for cholelithiasis or acute cholecystitis. Electronically Signed   By: Darliss Cheney M.D.   On: 01/26/2021 18:55        Scheduled Meds:  folic acid  1 mg Oral Daily   heparin  5,000 Units Subcutaneous Q8H   LORazepam  0-4 mg Intravenous Q6H   Followed by   Melene Muller ON 01/29/2021]  LORazepam  0-4 mg Intravenous Q12H   multivitamin with minerals  1 tablet Oral Daily   [START ON 02/02/2021] thiamine  100 mg Oral Daily   Continuous Infusions:  sodium chloride 200 mL/hr at 01/28/21 0950   thiamine injection 250 mg (01/28/21 0950)     LOS: 1 day    Time spent: over 30 min    Lacretia Nicks, MD [facility]   To contact the attending provider between 7A-7P or the covering provider during after hours 7P-7A, please log into the web site [email or web address] and access using universal [place] password for that web site. If you do not have the password, please call the hospital operator.  01/28/2021, 10:02 AM

## 2021-01-28 NOTE — Consult Note (Signed)
[facility]  INPATIENT CONSULTATION  Reason for Consultation: AKI Requesting Provider: Dr. Lowell Guitar  HPI: Frank [name] is an 50 y.o. male with h/o polysubstance abuse who was found down and is currently admitted with AKI for which nephrology asked to consult.  Found in bushes yesterday and brought to ED. Found to have CK > 50,000; imaging with R kidney trauma, R gluteal hematoma.  Ortho eval for compartment syndrome - not present; trauma surgery eval the perinephric injury - should resolve with supportive care.   He was admitted on NS 200/hr.  He currently is arousable but not willing to provide any history for me.  No h/o kidney problems. Denies voiding difficulties. No dyspnea, mild abd pain.  Says had N/V x1 yesterday.  No NSAIDs he endorses.  PMH: Past Medical History:  Diagnosis Date   Arthritis    Asthma    Gout    Rib fracture    PSH: No past surgical history on file.  Past Medical History:  Diagnosis Date   Arthritis    Asthma    Gout    Rib fracture     Medications:  I have reviewed the patient's current medications.   Medications Prior to Admission  Medication Sig Dispense Refill   albuterol (PROVENTIL) (2.5 MG/3ML) 0.083% nebulizer solution Take 3 mLs (2.5 mg total) by nebulization every 4 (four) hours as needed for wheezing or shortness of breath. 75 mL 3   albuterol (VENTOLIN HFA) 108 (90 Base) MCG/ACT inhaler Inhale 2 puffs into the lungs every 4 (four) hours as needed for wheezing or shortness of breath. 8 g 2   albuterol (PROVENTIL HFA;VENTOLIN HFA) 108 (90 BASE) MCG/ACT inhaler Inhale 1-2 puffs into the lungs every 4 (four) hours as needed for wheezing or shortness of breath. (Patient not taking: Reported on 01/26/2021) 1 Inhaler 0   azithromycin (ZITHROMAX Z-PAK) 250 MG tablet Take 1 tablet (250 mg total) by mouth daily. 2 tablets on the first day.  1 tablet daily for the next 4 days (Patient not taking: Reported on 01/26/2021) 6 tablet 0    naproxen (NAPROSYN) 500 MG tablet Take 1 tablet (500 mg total) by mouth 2 (two) times daily as needed for moderate pain. (Patient not taking: Reported on 01/26/2021) 15 tablet 0   predniSONE (DELTASONE) 20 MG tablet 3 tabs po day one, then 2 po daily x 4 days (Patient not taking: Reported on 01/26/2021) 11 tablet 0    ALLERGIES:   Allergies  Allergen Reactions   Other Hives and Itching    Allergic to white chocolate    FAM HX: No family history on file.  Social History:   reports that he has been smoking cigarettes. He has been smoking an average of .2 packs per day. He does not have any smokeless tobacco history on file. He reports current alcohol use of about 15.0 standard drinks per week. He reports current drug use. Drugs: Marijuana, Cocaine, and "Crack" cocaine.  ROS: 12 system ROS limited by patient unwilling to answer questions  Blood pressure (!) 149/90, pulse 90, temperature 98.3 F (36.8 C), temperature source Oral, resp. rate 20, height 5\' 8"  (1.727 m), weight 64.4 kg, SpO2 97 %. PHYSICAL EXAM: Gen: lying flat on R side, quiet  Eyes: anicteric ENT: MMM Neck: supple CV:  RRR, no rub Abd: soft, mildly TTP througout Back: lungs clear Extr:  no edema Neuro: nonfocal Skin: cool and dry, no rashes noted   Results for orders placed or performed  during the hospital encounter of 01/26/21 (from the past 48 hour(s))  CBC with Differential     Status: Abnormal   Collection Time: 01/26/21  4:00 PM  Result Value Ref Range   WBC 22.0 (H) 4.0 - 10.5 K/uL   RBC 4.71 4.22 - 5.81 MIL/uL   Hemoglobin 16.2 13.0 - 17.0 g/dL   HCT 40.9 81.1 - 91.4 %   MCV 99.8 80.0 - 100.0 fL   MCH 34.4 (H) 26.0 - 34.0 pg   MCHC 34.5 30.0 - 36.0 g/dL   RDW 78.2 95.6 - 21.3 %   Platelets 298 150 - 400 K/uL   nRBC 0.0 0.0 - 0.2 %   Neutrophils Relative % 89 %   Neutro Abs 19.5 (H) 1.7 - 7.7 K/uL   Lymphocytes Relative 2 %   Lymphs Abs 0.5 (L) 0.7 - 4.0 K/uL   Monocytes Relative 8 %   Monocytes  Absolute 1.8 (H) 0.1 - 1.0 K/uL   Eosinophils Relative 0 %   Eosinophils Absolute 0.0 0.0 - 0.5 K/uL   Basophils Relative 0 %   Basophils Absolute 0.0 0.0 - 0.1 K/uL   Immature Granulocytes 1 %   Abs Immature Granulocytes 0.19 (H) 0.00 - 0.07 K/uL    Comment: Performed at [facility], [address], [place], [place] [address]  Comprehensive metabolic panel     Status: Abnormal   Collection Time: 01/26/21  4:00 PM  Result Value Ref Range   Sodium 136 135 - 145 mmol/L   Potassium 4.9 3.5 - 5.1 mmol/L   Chloride 96 (L) 98 - 111 mmol/L   CO2 20 (L) 22 - 32 mmol/L   Glucose, Bld 136 (H) 70 - 99 mg/dL    Comment: Glucose reference range applies only to samples taken after fasting for at least 8 hours.   BUN 39 (H) 6 - 20 mg/dL   Creatinine, Ser 8.46 (H) 0.61 - 1.24 mg/dL   Calcium 8.0 (L) 8.9 - 10.3 mg/dL   Total Protein 8.0 6.5 - 8.1 g/dL   Albumin 4.4 3.5 - 5.0 g/dL   AST 9,629 (H) 15 - 41 U/L   ALT 392 (H) 0 - 44 U/L   Alkaline Phosphatase 64 38 - 126 U/L   Total Bilirubin 0.8 0.3 - 1.2 mg/dL   GFR, Estimated 18 (L) >60 mL/min    Comment: (NOTE) Calculated using the CKD-EPI Creatinine Equation (2021)    Anion gap 20 (H) 5 - 15    Comment: Performed at [facility], [address], [place], [place] [address]  Ethanol     Status: None   Collection Time: 01/26/21  4:00 PM  Result Value Ref Range   Alcohol, Ethyl (B) <10 <10 mg/dL    Comment: (NOTE) Lowest detectable limit for serum alcohol is 10 mg/dL.  For medical purposes only. Performed at [facility], [address], [place], [place] [address]   Troponin I (High Sensitivity)     Status: Abnormal   Collection Time: 01/26/21  4:00 PM  Result Value Ref Range   Troponin I (High Sensitivity) 45 (H) <18 ng/L    Comment: (NOTE) Elevated high sensitivity troponin I (hsTnI) values and significant  changes across serial measurements may suggest ACS but many other  chronic and acute conditions are known to  elevate hsTnI results.  Refer to the "Links" section for chest pain algorithms and additional  guidance. Performed at [facility], [address], [place], [place] [address]   Ammonia     Status: Abnormal   Collection Time: 01/26/21  4:00 PM  Result Value Ref Range   Ammonia 56 (H) 9 - 35 umol/L    Comment: Performed at [facility], [address], [place], [place] [address]  TSH     Status: None   Collection Time: 01/26/21  4:00 PM  Result Value Ref Range   TSH 2.341 0.350 - 4.500 uIU/mL    Comment: Performed by a 3rd Generation assay with a functional sensitivity of <=0.01 uIU/mL. Performed at [facility], [address], [place], [place] [address]   Lactic acid, plasma     Status: Abnormal   Collection Time: 01/26/21  4:00 PM  Result Value Ref Range   Lactic Acid, Venous 2.3 (HH) 0.5 - 1.9 mmol/L    Comment: CRITICAL RESULT CALLED TO, READ BACK BY AND VERIFIED WITH:  Melburn Hake, RN, 1703, 01/26/21, E. ADEDOKUN. Performed at [facility], [address] [place], [place] [address]   Resp Panel by RT-PCR (Flu A&B, Covid) Nasopharyngeal Swab     Status: None   Collection Time: 01/26/21  4:06 PM   Specimen: Nasopharyngeal Swab; Nasopharyngeal(NP) swabs in vial transport medium  Result Value Ref Range   SARS Coronavirus 2 by RT PCR NEGATIVE NEGATIVE    Comment: (NOTE) SARS-CoV-2 target nucleic acids are NOT DETECTED.  The SARS-CoV-2 RNA is generally detectable in upper respiratory specimens during the acute phase of infection. The lowest concentration of SARS-CoV-2 viral copies this assay can detect is 138 copies/mL. A negative result does not preclude SARS-Cov-2 infection and should not be used as the sole basis for treatment or other patient management decisions. A negative result may occur with  improper specimen collection/handling, submission of specimen other than nasopharyngeal swab, presence of viral mutation(s) within the areas targeted by this  assay, and inadequate number of viral copies(<138 copies/mL). A negative result must be combined with clinical observations, patient history, and epidemiological information. The expected result is Negative.  Fact Sheet for Patients:  BloggerCourse.com  Fact Sheet for Healthcare Providers:  SeriousBroker.it  This test is no t yet approved or cleared by the [place] FDA and  has been authorized for detection and/or diagnosis of SARS-CoV-2 by FDA under an Emergency Use Authorization (EUA). This EUA will remain  in effect (meaning this test can be used) for the duration of the COVID-19 declaration under Section 564(b)(1) of the Act, 21 U.S.C.section 360bbb-3(b)(1), unless the authorization is terminated  or revoked sooner.       Influenza A by PCR NEGATIVE NEGATIVE   Influenza B by PCR NEGATIVE NEGATIVE    Comment: (NOTE) The Xpert Xpress SARS-CoV-2/FLU/RSV plus assay is intended as an aid in the diagnosis of influenza from Nasopharyngeal swab specimens and should not be used as a sole basis for treatment. Nasal washings and aspirates are unacceptable for Xpert Xpress SARS-CoV-2/FLU/RSV testing.  Fact Sheet for Patients: BloggerCourse.com  Fact Sheet for Healthcare Providers: SeriousBroker.it  This test is not yet approved or cleared by the [place] FDA and has been authorized for detection and/or diagnosis of SARS-CoV-2 by FDA under an Emergency Use Authorization (EUA). This EUA will remain in effect (meaning this test can be used) for the duration of the COVID-19 declaration under Section 564(b)(1) of the Act, 21 U.S.C. section 360bbb-3(b)(1), unless the authorization is terminated or revoked.  Performed at [facility], [address]  [place], [place] [address]   Troponin I (High Sensitivity)     Status: Abnormal   Collection Time: 01/26/21  5:47 PM   Result Value Ref Range   Troponin I (High Sensitivity) 47 (H) <18 ng/L    Comment: (NOTE) Elevated high sensitivity troponin I (hsTnI) values and significant  changes across serial measurements may suggest ACS but many other  chronic and acute conditions are known to elevate hsTnI results.  Refer to the "Links" section for chest pain algorithms and additional  guidance. Performed at [facility], [address], [place], [place] [address]   Protime-INR     Status: None   Collection Time: 01/26/21  5:47 PM  Result Value Ref Range   Prothrombin Time 13.8 11.4 - 15.2 seconds   INR 1.1 0.8 - 1.2    Comment: (NOTE) INR goal varies based on device and disease states. Performed at [facility], [address], [place], [place] [address]   Lactic acid, plasma     Status: None   Collection Time: 01/26/21  5:49 PM  Result Value Ref Range   Lactic Acid, Venous 1.5 0.5 - 1.9 mmol/L    Comment: Performed at [facility], [address], [place], [place] [address]  Hepatitis panel, acute     Status: None (Preliminary result)   Collection Time: 01/26/21  9:18 PM  Result Value Ref Range   Hepatitis B Surface Ag NON REACTIVE NON REACTIVE   HCV Ab PENDING NON REACTIVE   Hep A IgM NON REACTIVE NON REACTIVE   Hep B C IgM NON REACTIVE NON REACTIVE    Comment: Performed at [facility], [address] [place], [place] [address]  HIV Antibody (routine testing w rflx)     Status: None   Collection Time: 01/26/21  9:18 PM  Result Value Ref Range   HIV Screen 4th Generation wRfx Non Reactive Non Reactive    Comment: Performed at [facility], [address] [place], [place] [address]  Troponin I (High Sensitivity)     Status: Abnormal   Collection Time: 01/26/21 10:09 PM  Result Value Ref Range   Troponin I (High Sensitivity) 39 (H) <18 ng/L    Comment: (NOTE) Elevated high sensitivity troponin I (hsTnI) values and significant  changes across serial measurements  may suggest ACS but many other  chronic and acute conditions are known to elevate hsTnI results.  Refer to the "Links" section for chest pain algorithms and additional  guidance. Performed at [facility], [address], [place], [place] [address]   Urinalysis, Complete w Microscopic Urine, Clean Catch     Status: Abnormal   Collection Time: 01/27/21  8:39 AM  Result Value Ref Range   Color, Urine AMBER (A) YELLOW    Comment: BIOCHEMICALS MAY BE AFFECTED BY COLOR   APPearance CLOUDY (A) CLEAR   Specific Gravity, Urine 1.016 1.005 - 1.030   pH 5.0 5.0 - 8.0   Glucose, UA 50 (A) NEGATIVE mg/dL   Hgb urine dipstick LARGE (A) NEGATIVE   Bilirubin Urine NEGATIVE NEGATIVE   Ketones, ur NEGATIVE NEGATIVE mg/dL   Protein, ur 253 (A) NEGATIVE mg/dL   Nitrite NEGATIVE NEGATIVE   Leukocytes,Ua NEGATIVE NEGATIVE   RBC / HPF 6-10 0 - 5 RBC/hpf   WBC, UA 0-5 0 - 5 WBC/hpf   Bacteria, UA RARE (A) NONE SEEN   Squamous Epithelial / LPF 0-5 0 - 5  Mucus PRESENT    Hyaline Casts, UA PRESENT    Amorphous Crystal PRESENT     Comment: Performed at [facility], [address] [place], [place] [address]  Magnesium     Status: None   Collection Time: 01/27/21  5:11 PM  Result Value Ref Range   Magnesium 2.0 1.7 - 2.4 mg/dL    Comment: Performed at [facility], [address] [place], [place] [address]  CBC with Differential/Platelet     Status: Abnormal   Collection Time: 01/27/21  5:11 PM  Result Value Ref Range   WBC 14.4 (H) 4.0 - 10.5 K/uL   RBC 3.43 (L) 4.22 - 5.81 MIL/uL   Hemoglobin 11.8 (L) 13.0 - 17.0 g/dL    Comment: REPEATED TO VERIFY   HCT 33.4 (L) 39.0 - 52.0 %   MCV 97.4 80.0 - 100.0 fL   MCH 34.4 (H) 26.0 - 34.0 pg   MCHC 35.3 30.0 - 36.0 g/dL   RDW 38.7 56.4 - 33.2 %   Platelets 210 150 - 400 K/uL   nRBC 0.0 0.0 - 0.2 %   Neutrophils Relative % 85 %   Neutro Abs 12.2 (H) 1.7 - 7.7 K/uL   Lymphocytes Relative 6 %   Lymphs Abs 0.9 0.7 - 4.0 K/uL    Monocytes Relative 8 %   Monocytes Absolute 1.2 (H) 0.1 - 1.0 K/uL   Eosinophils Relative 0 %   Eosinophils Absolute 0.0 0.0 - 0.5 K/uL   Basophils Relative 0 %   Basophils Absolute 0.0 0.0 - 0.1 K/uL   Immature Granulocytes 1 %   Abs Immature Granulocytes 0.08 (H) 0.00 - 0.07 K/uL    Comment: Performed at [facility], [address] [place], [place] [address]  Comprehensive metabolic panel     Status: Abnormal   Collection Time: 01/27/21  5:11 PM  Result Value Ref Range   Sodium 129 (L) 135 - 145 mmol/L   Potassium 4.5 3.5 - 5.1 mmol/L   Chloride 99 98 - 111 mmol/L   CO2 18 (L) 22 - 32 mmol/L   Glucose, Bld 127 (H) 70 - 99 mg/dL    Comment: Glucose reference range applies only to samples taken after fasting for at least 8 hours.   BUN 61 (H) 6 - 20 mg/dL   Creatinine, Ser 4.16 (H) 0.61 - 1.24 mg/dL   Calcium 6.4 (LL) 8.9 - 10.3 mg/dL    Comment: CRITICAL RESULT CALLED TO, READ BACK BY AND VERIFIED WITH:  Richarda Blade, RN, 1857, 01/27/21, E. ADEDOKUN.    Total Protein 5.4 (L) 6.5 - 8.1 g/dL   Albumin 2.9 (L) 3.5 - 5.0 g/dL   AST 6,063 (H) 15 - 41 U/L   ALT 373 (H) 0 - 44 U/L   Alkaline Phosphatase 54 38 - 126 U/L   Total Bilirubin 0.6 0.3 - 1.2 mg/dL   GFR, Estimated 11 (L) >60 mL/min    Comment: (NOTE) Calculated using the CKD-EPI Creatinine Equation (2021)    Anion gap 12 5 - 15    Comment: Performed at [facility], [address], [place], [place] [address]  CK     Status: Abnormal   Collection Time: 01/27/21  5:11 PM  Result Value Ref Range   Total CK >50,000 (H) 49 - 397 U/L    Comment: RESULTS CONFIRMED BY MANUAL DILUTION Performed at [facility], [address], [place], [place] [address]  ABO/Rh     Status: None   Collection Time: 01/27/21  5:11 PM  Result Value Ref Range   ABO/RH(D)      B POS Performed at [facility], [address], [place], [place] [address]   Type and screen [facility]     Status: None    Collection Time: 01/28/21  2:43 AM  Result Value Ref Range   ABO/RH(D) B POS    Antibody Screen NEG    Sample Expiration      01/31/2021,2359 Performed at [facility], [address], [place], [place] [address]   CK     Status: Abnormal   Collection Time: 01/28/21  2:43 AM  Result Value Ref Range   Total CK >50,000 (H) 49 - 397 U/L    Comment: RESULTS CONFIRMED BY MANUAL DILUTION Performed at [facility], [address] [place], [place] [address]   CBC with Differential/Platelet     Status: Abnormal   Collection Time: 01/28/21  8:04 AM  Result Value Ref Range   WBC 12.9 (H) 4.0 - 10.5 K/uL   RBC 3.11 (L) 4.22 - 5.81 MIL/uL   Hemoglobin 10.7 (L) 13.0 - 17.0 g/dL   HCT 23.7 (L) 62.8 - 31.5 %   MCV 94.9 80.0 - 100.0 fL   MCH 34.4 (H) 26.0 - 34.0 pg   MCHC 36.3 (H) 30.0 - 36.0 g/dL   RDW 17.6 16.0 - 73.7 %   Platelets 198 150 - 400 K/uL   nRBC 0.0 0.0 - 0.2 %   Neutrophils Relative % 82 %   Neutro Abs 10.6 (H) 1.7 - 7.7 K/uL   Lymphocytes Relative 7 %   Lymphs Abs 1.0 0.7 - 4.0 K/uL   Monocytes Relative 10 %   Monocytes Absolute 1.2 (H) 0.1 - 1.0 K/uL   Eosinophils Relative 0 %   Eosinophils Absolute 0.0 0.0 - 0.5 K/uL   Basophils Relative 0 %   Basophils Absolute 0.0 0.0 - 0.1 K/uL   Immature Granulocytes 1 %   Abs Immature Granulocytes 0.10 (H) 0.00 - 0.07 K/uL    Comment: Performed at [facility], [address] [place], [place] [address]  Comprehensive metabolic panel     Status: Abnormal   Collection Time: 01/28/21  8:04 AM  Result Value Ref Range   Sodium 132 (L) 135 - 145 mmol/L   Potassium 4.3 3.5 - 5.1 mmol/L   Chloride 103 98 - 111 mmol/L   CO2 17 (L) 22 - 32 mmol/L   Glucose, Bld 90 70 - 99 mg/dL    Comment: Glucose reference range applies only to samples taken after fasting for at least 8 hours.   BUN 67 (H) 6 - 20 mg/dL   Creatinine, Ser 9.48 (H) 0.61 - 1.24 mg/dL   Calcium 6.7 (L) 8.9 - 10.3 mg/dL   Total Protein 5.1 (L) 6.5 - 8.1  g/dL   Albumin 2.6 (L) 3.5 - 5.0 g/dL   AST 546 (H) 15 - 41 U/L   ALT 356 (H) 0 - 44 U/L   Alkaline Phosphatase 55 38 - 126 U/L   Total Bilirubin 0.8 0.3 - 1.2 mg/dL   GFR, Estimated 9 (L) >60 mL/min    Comment: (NOTE) Calculated using the CKD-EPI Creatinine Equation (2021)    Anion gap 12 5 - 15    Comment: Performed at [facility], [address] [place], [place] [address]  CT ABDOMEN WO CONTRAST  Addendum Date: 01/27/2021   ADDENDUM REPORT: 01/27/2021 23:26 ADDENDUM: These results were called by telephone at the time of interpretation on 01/27/2021 at 11:23 pm to provider Dr. Rachael Darby, who verbally acknowledged these results. Electronically Signed   By: Tish Frederickson M.D.   On: 01/27/2021 23:26   Result Date: 01/27/2021 CLINICAL DATA:  Hemoperitoneum. stumble at gas station and fall into bushes, EXAM: CT ABDOMEN AND PELVS WITHOUT CONTRAST TECHNIQUE: Multidetector CT imaging of the abdomen was performed following the standard protocol without IV contrast. COMPARISON:  X-ray chest 01/26/2021, CT pelvis 01/27/2021 FINDINGS: Lower abdomen: No acute abnormality. Liver: Not enlarged. No focal lesion. Biliary System: The gallbladder is otherwise unremarkable with no radio-opaque gallstones. No biliary ductal dilatation. Pancreas: Normal pancreatic contour. No main pancreatic duct dilatation. Spleen: Not enlarged. No focal lesion. Adrenal Glands: No nodularity bilaterally. Kidneys: Marked perinephric stranding surrounding the right kidney with free fluid coursing inferiorly along the right pericolic gutter. No hydroureteronephrosis. No nephroureterolithiasis. No contour deforming renal mass. The urinary bladder is unremarkable. Bowel: No small or large bowel wall thickening or dilatation. The appendix is unremarkable. Mesentery, Omentum, and Peritoneum: High density trace to small volume abdominal and pelvic free fluid. Fat stranding noted within the left upper quadrant and surrounding the  left kidney. No pneumoperitoneum. No mesenteric hematoma identified. No organized fluid collection. Pelvic Organs: Normal. Lymph Nodes: No abdominal, pelvic, inguinal lymphadenopathy. Vasculature: Atherosclerotic plaque. No abdominal aorta or iliac aneurysm. Musculoskeletal: Markedly asymmetric, hypodense, and heterogeneous right gluteal musculature compared to the left. Associated overlying subcutaneus soft tissue edema. Tiny fat containing umbilical hernia. No suspicious lytic or blastic osseous lesions. No acute displaced fracture. Multilevel degenerative changes of the spine. IMPRESSION: 1. Marked perinephric stranding surrounding the right kidney with at least small volume hemorrhage within the retroperitoneum. Findings concerning for trauma. Superimposed infection not excluded. Correlate with urinalysis. 2. Markedly asymmetric, hypodense, and heterogeneous right gluteal musculature compared to the left. Findings suggestive of an underlying hematoma formation. Mass is not excluded. 3. Fat stranding noted within the left upper quadrant and surrounding the left kidney. 4.  Aortic Atherosclerosis (ICD10-I70.0). 5. Markedly limited evaluation on this noncontrast study. Recommend repeat trauma protocol with intravenous contrast (including a delayed phase of the abdomen and pelvis). Electronically Signed: By: Tish Frederickson M.D. On: 01/27/2021 23:09   DG Ribs Unilateral W/Chest Right  Result Date: 01/26/2021 CLINICAL DATA:  Status post fall. EXAM: RIGHT RIBS AND CHEST - 3+ VIEW COMPARISON:  None. FINDINGS: No acute displaced right rib fracture or other bone lesions are seen involving the ribs. The heart and mediastinal contours are within normal limits. No focal consolidation. No pulmonary edema. No pleural effusion. No pneumothorax. No acute osseous abnormality. IMPRESSION: 1. No acute displaced right rib fracture. Please note, nondisplaced rib fractures may be occult on radiograph. 2. No acute cardiopulmonary  abnormality. Electronically Signed   By: Tish Frederickson M.D.   On: 01/26/2021 17:15   DG Ankle Complete Left  Result Date: 01/26/2021 CLINICAL DATA:  Chills and cough.  Status post fall. EXAM: LEFT ANKLE COMPLETE - 3+ VIEW COMPARISON:  None. FINDINGS: There is no evidence of fracture, dislocation, or joint effusion. There is no evidence of arthropathy or other focal bone abnormality. Soft tissues are unremarkable. IMPRESSION: Negative. Electronically Signed   By: Signa Kell M.D.   On: 01/26/2021 17:10   DG Ankle Complete Right  Result Date: 01/26/2021 CLINICAL DATA:  Chills and cough, fall. EXAM: RIGHT ANKLE - COMPLETE  3+ VIEW COMPARISON:  None. FINDINGS: There is no evidence of fracture, dislocation, or joint effusion. There is no evidence of arthropathy or other focal bone abnormality. Soft tissues are unremarkable. IMPRESSION: Negative. Electronically Signed   By: Darliss Cheney M.D.   On: 01/26/2021 17:09   CT HEAD WO CONTRAST ( )  Result Date: 01/26/2021 CLINICAL DATA:  Head trauma, mod-severe; Neck trauma, intoxicated or obtunded (Age >= 16y). EtOH on board, syncope/fall and hit head. EXAM: CT HEAD WITHOUT CONTRAST CT CERVICAL SPINE WITHOUT CONTRAST TECHNIQUE: Multidetector CT imaging of the head and cervical spine was performed following the standard protocol without intravenous contrast. Multiplanar CT image reconstructions of the cervical spine were also generated. COMPARISON:  09/03/2020 FINDINGS: CT HEAD FINDINGS Brain: There is no evidence of an acute infarct, intracranial hemorrhage, mass, midline shift, or extra-axial fluid collection. The ventricles and sulci are normal. Vascular: No hyperdense vessel. Skull: No fracture or suspicious osseous lesion. Sinuses/Orbits: Visualized paranasal sinuses and mastoid air cells are clear. Unremarkable orbits. Other: None. CT CERVICAL SPINE FINDINGS Alignment: Straightening of the normal cervical lordosis. Chronic trace anterolisthesis of C4 on  C5. Skull base and vertebrae: No acute fracture or suspicious osseous lesion. Soft tissues and spinal canal: No prevertebral fluid or swelling. No visible canal hematoma. Disc levels: Similar appearance of multilevel disc degeneration, greatest at C5-6 and C6-7 with uncovertebral spurring resulting in moderate bilateral neural foraminal stenosis. Upper chest: Clear lung apices. Other: None. IMPRESSION: 1. No evidence of acute intracranial abnormality. 2. No acute cervical spine fracture. Electronically Signed   By: Sebastian Ache M.D.   On: 01/26/2021 17:50   CT Cervical Spine Wo Contrast  Result Date: 01/26/2021 CLINICAL DATA:  Head trauma, mod-severe; Neck trauma, intoxicated or obtunded (Age >= 16y). EtOH on board, syncope/fall and hit head. EXAM: CT HEAD WITHOUT CONTRAST CT CERVICAL SPINE WITHOUT CONTRAST TECHNIQUE: Multidetector CT imaging of the head and cervical spine was performed following the standard protocol without intravenous contrast. Multiplanar CT image reconstructions of the cervical spine were also generated. COMPARISON:  09/03/2020 FINDINGS: CT HEAD FINDINGS Brain: There is no evidence of an acute infarct, intracranial hemorrhage, mass, midline shift, or extra-axial fluid collection. The ventricles and sulci are normal. Vascular: No hyperdense vessel. Skull: No fracture or suspicious osseous lesion. Sinuses/Orbits: Visualized paranasal sinuses and mastoid air cells are clear. Unremarkable orbits. Other: None. CT CERVICAL SPINE FINDINGS Alignment: Straightening of the normal cervical lordosis. Chronic trace anterolisthesis of C4 on C5. Skull base and vertebrae: No acute fracture or suspicious osseous lesion. Soft tissues and spinal canal: No prevertebral fluid or swelling. No visible canal hematoma. Disc levels: Similar appearance of multilevel disc degeneration, greatest at C5-6 and C6-7 with uncovertebral spurring resulting in moderate bilateral neural foraminal stenosis. Upper chest: Clear  lung apices. Other: None. IMPRESSION: 1. No evidence of acute intracranial abnormality. 2. No acute cervical spine fracture. Electronically Signed   By: Sebastian Ache M.D.   On: 01/26/2021 17:50   CT PELVIS WO CONTRAST  Result Date: 01/27/2021 CLINICAL DATA:  Pelvic trauma, right lower extremity numbness, right hip pain EXAM: CT PELVIS WITHOUT CONTRAST TECHNIQUE: Multidetector CT imaging of the pelvis was performed following the standard protocol without intravenous contrast. COMPARISON:  Pelvis radiographs 12/21/2013 FINDINGS: Urinary Tract:  No abnormality visualized. Bowel: There is no abnormal bowel wall thickening or inflammatory change the level imaged. Vascular/Lymphatic: The vasculature is unremarkable within the confines of noncontrast technique. There is no lymphadenopathy in the pelvis. Reproductive:  The prostate and seminal  vesicles are unremarkable. Other: There is blood in the presacral space and right pericolic gutter. There is no free air to the level imaged. Musculoskeletal: There is an intramuscular hematoma in the right gluteus musculature with musculotendinous injury with probable involvement of at least the right gluteus medius and piriformis muscles. No fracture is identified. IMPRESSION: 1. Hemoperitoneum, incompletely evaluated. No free air is seen in the pelvis. Recommend CT of the abdomen and pelvis with IV contrast for further evaluation. 2. S on the right uspected musculotendinous injuries with probable involvement of at least the right gluteus medius and piriformis muscles. 3. No fracture is identified. Electronically Signed   By: Lesia Hausen M.D.   On: 01/27/2021 14:01   MR BRAIN WO CONTRAST  Result Date: 01/27/2021 CLINICAL DATA:  Neuro deficit, stroke suspected EXAM: MRI HEAD WITHOUT CONTRAST TECHNIQUE: Multiplanar, multiecho pulse sequences of the brain and surrounding structures were obtained without intravenous contrast. COMPARISON:  No prior MRI correlation is made with  01/26/2021 CT brain. FINDINGS: Brain: Evaluation somewhat limited by motion artifact. No acute infarction, hemorrhage, hydrocephalus, mass, mass effect, or midline shift. Scattered T2 hyperintense signal in the periventricular white matter, likely the sequela of chronic small vessel ischemic disease. Vascular: Normal flow voids. Skull and upper cervical spine: Normal marrow signal. Sinuses/Orbits: Imaged sinuses are clear. The orbits are unremarkable. Other: Trace fluid in mastoid air cells. IMPRESSION: No acute intracranial process. Electronically Signed   By: Wiliam Ke M.D.   On: 01/27/2021 12:23   MR PELVIS WO CONTRAST  Result Date: 01/27/2021 CLINICAL DATA:  Unknown trauma.  Found down.  Abnormal CT scan. EXAM: MRI PELVIS WITHOUT CONTRAST, MRI RIGHT FEMUR WITHOUT CONTRAST TECHNIQUE: Multiplanar multisequence MR imaging of the pelvis was performed. No intravenous contrast was administered. COMPARISON:  CT scan, same date. FINDINGS: Significant trauma involving the right sided pelvic and hip musculature with marked increased T2 signal intensity most notably in the right gluteal muscles. Large intramuscular hematoma in the gluteus minimus muscle. Grade 2 tearing of the gluteus maximus muscle inferiorly along the musculotendinous junction. High-grade partial tearing of the quadratus femoris muscle and the external portion of the obturator internus muscle. Both hips are normally located. No hip or pelvic fractures are identified. The pubic symphysis and SI joints are intact. The hamstring tendons are intact. Free fluid noted in the lower abdomen and pelvis but no discrete intrapelvic hematoma. Bladder is grossly normal. Prostate gland and seminal vesicles are unremarkable. IMPRESSION: 1. Significant trauma involving the right sided pelvic and hip musculature with extensive muscle tears and intramuscular and interfascial hemorrhage, edema and fluid. 2. No fractures are identified. Electronically Signed   By:  Rudie Meyer M.D.   On: 01/27/2021 20:16   US RENAL  Result Date: 01/26/2021 CLINICAL DATA:  Acute kidney injury EXAM: RENAL / URINARY TRACT ULTRASOUND COMPLETE COMPARISON:  None. FINDINGS: Right Kidney: Renal measurements: 10.4 x 4.6 x 4.2 cm = volume: 105.3 mL. Cortex appears echogenic. No mass or hydronephrosis. Left Kidney: Renal measurements: 8.8 x 5.1 x 3.5 cm = volume: 81.3 mL. Cortex appears slightly echogenic. No mass or hydronephrosis Bladder: Debris in the urinary bladder Other: None. IMPRESSION: 1. Kidneys appear slightly echogenic suggesting medical renal disease. No hydronephrosis 2. Debris in the urinary bladder Electronically Signed   By: Jasmine Pang M.D.   On: 01/26/2021 23:30   MR ZOXWR RIGHT WO CONTRAST  Result Date: 01/27/2021 CLINICAL DATA:  Unknown trauma.  Found down.  Abnormal CT scan. EXAM: MRI PELVIS WITHOUT  CONTRAST, MRI RIGHT FEMUR WITHOUT CONTRAST TECHNIQUE: Multiplanar multisequence MR imaging of the pelvis was performed. No intravenous contrast was administered. COMPARISON:  CT scan, same date. FINDINGS: Significant trauma involving the right sided pelvic and hip musculature with marked increased T2 signal intensity most notably in the right gluteal muscles. Large intramuscular hematoma in the gluteus minimus muscle. Grade 2 tearing of the gluteus maximus muscle inferiorly along the musculotendinous junction. High-grade partial tearing of the quadratus femoris muscle and the external portion of the obturator internus muscle. Both hips are normally located. No hip or pelvic fractures are identified. The pubic symphysis and SI joints are intact. The hamstring tendons are intact. Free fluid noted in the lower abdomen and pelvis but no discrete intrapelvic hematoma. Bladder is grossly normal. Prostate gland and seminal vesicles are unremarkable. IMPRESSION: 1. Significant trauma involving the right sided pelvic and hip musculature with extensive muscle tears and intramuscular  and interfascial hemorrhage, edema and fluid. 2. No fractures are identified. Electronically Signed   By: Rudie Meyer M.D.   On: 01/27/2021 20:16   DG Hip Unilat W or Wo Pelvis 2-3 Views Right  Result Date: 01/26/2021 CLINICAL DATA:  Status post fall. EXAM: DG HIP (WITH OR WITHOUT PELVIS) 2-3V RIGHT COMPARISON:  01/29/2009 FINDINGS: There is no evidence of hip fracture or dislocation. There is no evidence of arthropathy or other focal bone abnormality. IMPRESSION: Negative. Electronically Signed   By: Signa Kell M.D.   On: 01/26/2021 17:08   VAS [place] LOWER EXTREMITY VENOUS (DVT)  Result Date: 01/27/2021  Lower Venous DVT Study Patient Name:  WESTIN KNOTTS  Date of Exam:   01/26/2021 Medical Rec #: [id number]        Accession #:    [id number] Date of Birth: 06-Feb-1971        Patient Gender: M Patient Age:   65 years Exam Location:  [facility] Procedure:      VAS [place] LOWER EXTREMITY VENOUS (DVT) Referring Phys: Lynden Oxford --------------------------------------------------------------------------------  Indications: Pain s/p fall.  Comparison Study: No prior studies. Performing Technologist: Jean Rosenthal RDMS, RVT  Examination Guidelines: A complete evaluation includes B-mode imaging, spectral Doppler, color Doppler, and power Doppler as needed of all accessible portions of each vessel. Bilateral testing is considered an integral part of a complete examination. Limited examinations for reoccurring indications may be performed as noted. The reflux portion of the exam is performed with the patient in reverse Trendelenburg.  +---------+---------------+---------+-----------+----------+--------------+ RIGHT    CompressibilityPhasicitySpontaneityPropertiesThrombus Aging +---------+---------------+---------+-----------+----------+--------------+ CFV      Full           Yes      Yes                                  +---------+---------------+---------+-----------+----------+--------------+ SFJ      Full                                                        +---------+---------------+---------+-----------+----------+--------------+ FV Prox  Full                                                        +---------+---------------+---------+-----------+----------+--------------+  FV Mid   Full                                                        +---------+---------------+---------+-----------+----------+--------------+ FV DistalFull                                                        +---------+---------------+---------+-----------+----------+--------------+ PFV      Full                                                        +---------+---------------+---------+-----------+----------+--------------+ POP      Full           Yes      Yes                                 +---------+---------------+---------+-----------+----------+--------------+ PTV      Full                                                        +---------+---------------+---------+-----------+----------+--------------+ PERO     Full                                                        +---------+---------------+---------+-----------+----------+--------------+ Gastroc  Full                                                        +---------+---------------+---------+-----------+----------+--------------+     Summary: RIGHT: - There is no evidence of deep vein thrombosis in the lower extremity.  - No cystic structure found in the popliteal fossa.   *See table(s) above for measurements and observations. Electronically signed by Waverly Ferrari MD on 01/27/2021 at 6:44:38 AM.    Final    US Abdomen Limited RUQ (LIVER/GB)  Result Date: 01/26/2021 CLINICAL DATA:  Right upper quadrant pain. EXAM: ULTRASOUND ABDOMEN LIMITED RIGHT UPPER QUADRANT COMPARISON:  CT chest abdomen and pelvis 04/04/2009  report only. FINDINGS: Gallbladder: No gallstones or wall thickening visualized. Gallbladder sludge is present. No sonographic Murphy sign noted by sonographer. Common bile duct: Diameter: 3.7 mm. Liver: No focal lesion identified. Within normal limits in parenchymal echogenicity. Portal vein is patent on color Doppler imaging with normal direction of blood flow towards the liver. Other: None. IMPRESSION: 1. Gallbladder sludge. No additional sonographic evidence for cholelithiasis or acute cholecystitis. Electronically Signed   By: Darliss Cheney M.D.   On: 01/26/2021 18:55    Assessment/Plan **Severe  AKI:  suspect issue here is rhabdo but trauma/hemorrhage to R kidney could be contributing.  Stranding around L kidney noted too but no e/o pyelo on UA.  Has some blood and protein which are likely nonspecific with AKI/rhabdo.  No role for serologies or biopsy currently.   Cont aggressive hydration unless develops s/s vol overload.  Trend Cr.  May end up needing dialysis but no indication currently. He would be agreeable if needed.  Avoid hypotension and nephrotoxins as able.  Follow I/Os.   **Rhabdomyolysis: traumatic.  Hydation per above  **Metabolic acidosis: secondary to AKI< start oral bicarb  **Anemia: mild, no major issue.    **Polysubstance abuse: counselled  Will follow, call with concerns.   Tyler Pita 01/28/2021, 11:58 AM

## 2021-01-29 LAB — MAGNESIUM: Magnesium: 1.8 mg/dL (ref 1.7–2.4)

## 2021-01-29 LAB — URINE CULTURE: Culture: NO GROWTH

## 2021-01-29 LAB — CBC WITH DIFFERENTIAL/PLATELET
Abs Immature Granulocytes: 0.05 10*3/uL (ref 0.00–0.07)
Basophils Absolute: 0 10*3/uL (ref 0.0–0.1)
Basophils Relative: 0 %
Eosinophils Absolute: 0.1 10*3/uL (ref 0.0–0.5)
Eosinophils Relative: 1 %
HCT: 27.9 % — ABNORMAL LOW (ref 39.0–52.0)
Hemoglobin: 9.9 g/dL — ABNORMAL LOW (ref 13.0–17.0)
Immature Granulocytes: 0 %
Lymphocytes Relative: 10 %
Lymphs Abs: 1.1 10*3/uL (ref 0.7–4.0)
MCH: 34.3 pg — ABNORMAL HIGH (ref 26.0–34.0)
MCHC: 35.5 g/dL (ref 30.0–36.0)
MCV: 96.5 fL (ref 80.0–100.0)
Monocytes Absolute: 1.1 10*3/uL — ABNORMAL HIGH (ref 0.1–1.0)
Monocytes Relative: 9 %
Neutro Abs: 8.9 10*3/uL — ABNORMAL HIGH (ref 1.7–7.7)
Neutrophils Relative %: 80 %
Platelets: 180 10*3/uL (ref 150–400)
RBC: 2.89 MIL/uL — ABNORMAL LOW (ref 4.22–5.81)
RDW: 12.7 % (ref 11.5–15.5)
WBC: 11.2 10*3/uL — ABNORMAL HIGH (ref 4.0–10.5)
nRBC: 0 % (ref 0.0–0.2)

## 2021-01-29 LAB — GASTROINTESTINAL PANEL BY PCR, STOOL (REPLACES STOOL CULTURE)

## 2021-01-29 LAB — COMPREHENSIVE METABOLIC PANEL
ALT: 348 U/L — ABNORMAL HIGH (ref 0–44)
AST: 813 U/L — ABNORMAL HIGH (ref 15–41)
Albumin: 2.5 g/dL — ABNORMAL LOW (ref 3.5–5.0)
Alkaline Phosphatase: 50 U/L (ref 38–126)
Anion gap: 10 (ref 5–15)
BUN: 70 mg/dL — ABNORMAL HIGH (ref 6–20)
CO2: 15 mmol/L — ABNORMAL LOW (ref 22–32)
Calcium: 7.2 mg/dL — ABNORMAL LOW (ref 8.9–10.3)
Chloride: 106 mmol/L (ref 98–111)
Creatinine, Ser: 8.29 mg/dL — ABNORMAL HIGH (ref 0.61–1.24)
GFR, Estimated: 7 mL/min — ABNORMAL LOW (ref >60)
Glucose, Bld: 97 mg/dL (ref 70–99)
Potassium: 3.9 mmol/L (ref 3.5–5.1)
Sodium: 131 mmol/L — ABNORMAL LOW (ref 135–145)
Total Bilirubin: 0.5 mg/dL (ref 0.3–1.2)
Total Protein: 4.9 g/dL — ABNORMAL LOW (ref 6.5–8.1)

## 2021-01-29 LAB — CK: Total CK: 48007 U/L — ABNORMAL HIGH (ref 49–397)

## 2021-01-29 LAB — HCV INTERPRETATION

## 2021-01-29 LAB — PHOSPHORUS: Phosphorus: 4.7 mg/dL — ABNORMAL HIGH (ref 2.5–4.6)

## 2021-01-29 MED ORDER — ALBUTEROL SULFATE (2.5 MG/3ML) 0.083% IN NEBU
2.5000 mg | INHALATION_SOLUTION | RESPIRATORY_TRACT | Status: DC | PRN
  Filled 2021-01-29 (×2): qty 3

## 2021-01-29 MED ORDER — SODIUM BICARBONATE 650 MG PO TABS
1300.0000 mg | ORAL_TABLET | Freq: Two times a day (BID) | ORAL | Status: DC
  Administered 2021-01-29 – 2021-02-05 (×15): 1300 mg via ORAL
  Filled 2021-01-29 (×15): qty 2

## 2021-01-29 NOTE — Progress Notes (Signed)
Patient ID: Frank [name], male   DOB: May 12, 1970, 50 y.o.   MRN: [id number]     Subjective: Reports diarrhea and some abdominal cramping overnight ROS negative except as listed above. Objective: Vital signs in last 24 hours: Temp:  [98.3 F (36.8 C)-99.6 F (37.6 C)] 99.6 F (37.6 C) (10/07 0500) Pulse Rate:  [84-92] 84 (10/07 0500) Resp:  [16-20] 20 (10/07 0500) BP: (144-157)/(87-106) 144/89 (10/07 0500) SpO2:  [97 %-100 %] 100 % (10/07 0500) Last BM Date: 01/28/21  Intake/Output from previous day: 10/06 0701 - 10/07 0700 In: 2967.5 [P.O.:480; I.V.:2387.5; IV Piggyback:100] Out: 325 [Urine:325] Intake/Output this shift: No intake/output data recorded.  General appearance: cooperative Resp: clear to auscultation bilaterally Cardio: regular rate and rhythm GI: soft, minimal r sided tenderness without guarding, no peritonitis More conversant  Lab Results: CBC  Recent Labs    01/28/21 0804 01/29/21 0318  WBC 12.9* 11.2*  HGB 10.7* 9.9*  HCT 29.5* 27.9*  PLT 198 180   BMET Recent Labs    01/28/21 0804 01/29/21 0318  NA 132* 131*  K 4.3 3.9  CL 103 106  CO2 17* 15*  GLUCOSE 90 97  BUN 67* 70*  CREATININE 7.05* 8.29*  CALCIUM 6.7* 7.2*   PT/INR Recent Labs    01/26/21 1747  LABPROT 13.8  INR 1.1   ABG No results for input(s): PHART, HCO3 in the last 72 hours.  Invalid input(s): PCO2, PO2  Studies/Results:   Anti-infectives: Anti-infectives (From admission, onward)    None       Assessment/Plan: Status post fall onto right side, likely down for a period of time Buttock and hip muscular injury and rhabdomyolysis causing acute kidney injury - Ortho consult noted, primary team treating AKI/rhabdo Right perinephric retroperitoneal hematoma with left perinephric stranding - retroperitoneal contusion.  No large renal injury seen on noncontrast study.  This should resolve without any further treatment.   Diarrhea - likely enteritis, bowel looked  normal on non-con CT, stool studies pending, WBC down to 11.2, no acute surgical issue ETOH and cocaine abuse  Trauma will sign off. Please recall PRN  LOS: 2 days    Violeta Gelinas, MD, MPH, FACS Trauma & General Surgery Use [email or web address] to contact on call provider  01/29/2021

## 2021-01-29 NOTE — TOC CAGE-AID Note (Signed)
Transition of Care [facility]) - CAGE-AID Screening   Patient Details  Name: Frank [name] MRN: [id number] Date of Birth: 23-Feb-1971  [facility] CM/SW Contact:    Margareta Laureano C Tarpley-Carter, LCSWA Phone Number: 01/29/2021, 3:07 PM   Clinical Narrative: Pt participated in Cage-Aid.  Pt stated he does use substance and ETOH.  Pt was offered resources, pt stated he did not need resources at this time.  CSW provided pt with resources for possible future use.  Lakeisa Heninger Tarpley-Carter, MSW, LCSW-A Pronouns:  She/Her/Hers [facility] HealthTransitions of Care Clinical Social Worker Direct Number:  [phone] [email or web address]  CAGE-AID Screening:    Have You Ever Felt You Ought to Cut Down on Your Drinking or Drug Use?: Yes Have People Annoyed You By [employer] Your Drinking Or Drug Use?: No Have You Felt Bad Or Guilty About Your Drinking Or Drug Use?: No Have You Ever Had a Drink or Used Drugs First Thing In The Morning to Steady Your Nerves or to Get Rid of a Hangover?: No CAGE-AID Score: 1  Substance Abuse Education Offered: Yes  Substance abuse interventions: Transport planner

## 2021-01-29 NOTE — Progress Notes (Signed)
PROGRESS NOTE    Frank [name]  FKC:[id number] DOB: 1970-12-20 DOA: 01/26/2021 PCP: Pcp, No    No chief complaint on file.   Brief Narrative:  50 yo AA male with hx of alcohol abuse, cocaine abuse, and THC use who presents after Frank [name] fall.  Reportedly smelled of etoh and was slurring speech with EMS.  Initially hypotenesive, but this has improved.    Imaging without acute findings.  He's been admitted with acute kidney injury and elevated LFT's in the setting of alcohol abuse.  He's complaining of right sided numbness and imaging is currently pending.   Assessment & Plan:   Principal Problem:   AKI (acute kidney injury) (HCC) Active Problems:   Elevated LFTs   Alcohol abuse   Polysubstance abuse (HCC)   Diarrhea  Rhabdomyolysis  Acute Kidney Injury Related to cocaine use/trauma  CK ~48,000 today - mild improvement Continue aggressive IVF  Only voided 1x yesterday, bladder scan [place] without hydro  UA with protein, 6-10 RBC Urine myoglobin pending He refused foley catheter placement for I/O monitoring  Creatinine continuing to rise today - appreciate renal assistance   Right Lower Extremity Numbness  Right Sided Hip and Pelvic Musculature Trauma  Intramuscular and Interfascial Hemorrhage, edema, and fluid  Mechanical Fall Appreciate orthopedic eval, no evidence of impending compartment syndrome - > symptoms thought related to 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 No motor deficits on exam, per ortho, sensation likely to return with time MRI brain without acute intracranial process  Hemoperitoneum CT abd/pelvis concerning for trauma - perinephric stranding around R kidney with small volume hemorrhage within retroperitoneum, asymmetric, hypodense, and heterogeneous R gluteal musculature Appreciate surgery eval and assistance - now signed off   Alcohol Abuse  CIWA protocol Encourage cessation  Elevated LFT's 2/2 etoh abuse and  rhabdo, trend RUQ [place] notable for sludge Pending hep C ab - negative HIV, hep Aarav Burgett, B  Diarrhea Follow GI panel HIV, acute hepatitis panel pending  Polysubstance abuse Encourage cessation  DVT prophylaxis: (heparin Code Status: full Family Communication: none at bedside Disposition:   Status is: inpatient  The patient will require care spanning > 2 midnights and should be moved to inpatient because: Inpatient level of care appropriate due to severity of illness  Dispo: The patient is from: Home              Anticipated d/c is to:  pending              Patient currently is not medically stable to d/c.   Difficult to place patient No       Consultants:  none  Procedures:  none  Antimicrobials:  Anti-infectives (From admission, onward)    None          Subjective: Continued RLE numbness  Objective: Vitals:   01/28/21 0742 01/28/21 1127 01/28/21 2112 01/29/21 0500  BP: (!) 147/87 (!) 149/90 (!) 157/106 (!) 144/89  Pulse: 92 90  84  Resp: 16 20 18 20   Temp: 98.8 F (37.1 C) 98.3 F (36.8 C) 98.7 F (37.1 C) 99.6 F (37.6 C)  TempSrc: Oral Oral Oral Oral  SpO2: 98% 97% 100% 100%  Weight:      Height:        Intake/Output Summary (Last 24 hours) at 01/29/2021 0903 Last data filed at 01/29/2021 0744 Gross per 24 hour  Intake 3453.65 ml  Output 325 ml  Net 3128.65 ml   03/31/2021  01/27/21 2200 01/28/21 0500  Weight: 62 kg 64.4 kg    Examination: General: No acute distress. Cardiovascular: RRR Lungs: unlabored Abdomen: Soft Neurological: Alert and oriented 3. Moves all extremities 4 . Decreased sensation to RLE, intact motor function Extremities: No clubbing or cyanosis. No edema.  TTP to right buttocks.      Data Reviewed: I have personally reviewed following labs and imaging studies  CBC: Recent Labs  Lab 01/26/21 1600 01/27/21 1711 01/28/21 0804 01/29/21 0318  WBC 22.0* 14.4* 12.9* 11.2*  NEUTROABS 19.5* 12.2* 10.6* 8.9*   HGB 16.2 11.8* 10.7* 9.9*  HCT 47.0 33.4* 29.5* 27.9*  MCV 99.8 97.4 94.9 96.5  PLT 298 210 198 180    Basic Metabolic Panel: Recent Labs  Lab 01/26/21 1600 01/27/21 1711 01/28/21 0804 01/29/21 0318  NA 136 129* 132* 131*  K 4.9 4.5 4.3 3.9  CL 96* 99 103 106  CO2 20* 18* 17* 15*  GLUCOSE 136* 127* 90 97  BUN 39* 61* 67* 70*  CREATININE 3.86* 5.60* 7.05* 8.29*  CALCIUM 8.0* 6.4* 6.7* 7.2*  MG  --  2.0  --  1.8  PHOS  --   --   --  4.7*    GFR: Estimated Creatinine Clearance: 9.5 mL/min (Frank [name]) (by C-G formula based on SCr of 8.29 mg/dL (H)).  Liver Function Tests: Recent Labs  Lab 01/26/21 1600 01/27/21 1711 01/28/21 0804 01/29/21 0318  AST 1,140* 1,001* 899* 813*  ALT 392* 373* 356* 348*  ALKPHOS 64 54 55 50  BILITOT 0.8 0.6 0.8 0.5  PROT 8.0 5.4* 5.1* 4.9*  ALBUMIN 4.4 2.9* 2.6* 2.5*    CBG: No results for input(s): GLUCAP in the last 168 hours.   Recent Results (from the past 240 hour(s))  Resp Panel by RT-PCR (Flu Frank [name]&B, Covid) Nasopharyngeal Swab     Status: None   Collection Time: 01/26/21  4:06 PM   Specimen: Nasopharyngeal Swab; Nasopharyngeal(NP) swabs in vial transport medium  Result Value Ref Range Status   SARS Coronavirus 2 by RT PCR NEGATIVE NEGATIVE Final    Comment: (NOTE) SARS-CoV-2 target nucleic acids are NOT DETECTED.  The SARS-CoV-2 RNA is generally detectable in upper respiratory specimens during the acute phase of infection. The lowest concentration of SARS-CoV-2 viral copies this assay can detect is 138 copies/mL. Frank [name] negative result does not preclude SARS-Cov-2 infection and should not be used as the sole basis for treatment or other patient management decisions. Frank [name] negative result may occur with  improper specimen collection/handling, submission of specimen other than nasopharyngeal swab, presence of viral mutation(s) within the areas targeted by this assay, and inadequate number of viral copies(<138 copies/mL). Frank [name] negative result  must be combined with clinical observations, patient history, and epidemiological information. The expected result is Negative.  Fact Sheet for Patients:  BloggerCourse.com  Fact Sheet for Healthcare Providers:  SeriousBroker.it  This test is no t yet approved or cleared by the [place] FDA and  has been authorized for detection and/or diagnosis of SARS-CoV-2 by FDA under an Emergency Use Authorization (EUA). This EUA will remain  in effect (meaning this test can be used) for the duration of the COVID-19 declaration under Section 564(b)(1) of the Act, 21 U.S.C.section 360bbb-3(b)(1), unless the authorization is terminated  or revoked sooner.       Influenza [place] Alipio by PCR NEGATIVE NEGATIVE Final   Influenza B by PCR NEGATIVE NEGATIVE Final    Comment: (NOTE) The Xpert Xpress SARS-CoV-2/FLU/RSV plus assay is intended as  an aid in the diagnosis of influenza from Nasopharyngeal swab specimens and should not be used as Anhad Sheeley sole basis for treatment. Nasal washings and aspirates are unacceptable for Xpert Xpress SARS-CoV-2/FLU/RSV testing.  Fact Sheet for Patients: BloggerCourse.com  Fact Sheet for Healthcare Providers: SeriousBroker.it  This test is not yet approved or cleared by the [place] FDA and has been authorized for detection and/or diagnosis of SARS-CoV-2 by FDA under an Emergency Use Authorization (EUA). This EUA will remain in effect (meaning this test can be used) for the duration of the COVID-19 declaration under Section 564(b)(1) of the Act, 21 U.S.C. section 360bbb-3(b)(1), unless the authorization is terminated or revoked.  Performed at [facility], [address], [place], [place] [address]          Radiology Studies: CT ABDOMEN WO CONTRAST  Addendum Date: 01/27/2021   ADDENDUM REPORT: 01/27/2021 23:26 ADDENDUM: These results were called by  telephone at the time of interpretation on 01/27/2021 at 11:23 pm to provider Dr. Rachael Darby, who verbally acknowledged these results. Electronically Signed   By: Tish Frederickson M.D.   On: 01/27/2021 23:26   Result Date: 01/27/2021 CLINICAL DATA:  Hemoperitoneum. stumble at gas station and fall into bushes, EXAM: CT ABDOMEN AND PELVS WITHOUT CONTRAST TECHNIQUE: Multidetector CT imaging of the abdomen was performed following the standard protocol without IV contrast. COMPARISON:  X-ray chest 01/26/2021, CT pelvis 01/27/2021 FINDINGS: Lower abdomen: No acute abnormality. Liver: Not enlarged. No focal lesion. Biliary System: The gallbladder is otherwise unremarkable with no radio-opaque gallstones. No biliary ductal dilatation. Pancreas: Normal pancreatic contour. No main pancreatic duct dilatation. Spleen: Not enlarged. No focal lesion. Adrenal Glands: No nodularity bilaterally. Kidneys: Marked perinephric stranding surrounding the right kidney with free fluid coursing inferiorly along the right pericolic gutter. No hydroureteronephrosis. No nephroureterolithiasis. No contour deforming renal mass. The urinary bladder is unremarkable. Bowel: No small or large bowel wall thickening or dilatation. The appendix is unremarkable. Mesentery, Omentum, and Peritoneum: High density trace to small volume abdominal and pelvic free fluid. Fat stranding noted within the left upper quadrant and surrounding the left kidney. No pneumoperitoneum. No mesenteric hematoma identified. No organized fluid collection. Pelvic Organs: Normal. Lymph Nodes: No abdominal, pelvic, inguinal lymphadenopathy. Vasculature: Atherosclerotic plaque. No abdominal aorta or iliac aneurysm. Musculoskeletal: Markedly asymmetric, hypodense, and heterogeneous right gluteal musculature compared to the left. Associated overlying subcutaneus soft tissue edema. Tiny fat containing umbilical hernia. No suspicious lytic or blastic osseous lesions. No acute displaced  fracture. Multilevel degenerative changes of the spine. IMPRESSION: 1. Marked perinephric stranding surrounding the right kidney with at least small volume hemorrhage within the retroperitoneum. Findings concerning for trauma. Superimposed infection not excluded. Correlate with urinalysis. 2. Markedly asymmetric, hypodense, and heterogeneous right gluteal musculature compared to the left. Findings suggestive of an underlying hematoma formation. Mass is not excluded. 3. Fat stranding noted within the left upper quadrant and surrounding the left kidney. 4.  Aortic Atherosclerosis (ICD10-I70.0). 5. Markedly limited evaluation on this noncontrast study. Recommend repeat trauma protocol with intravenous contrast (including Suede Douthat delayed phase of the abdomen and pelvis). Electronically Signed: By: Tish Frederickson M.D. On: 01/27/2021 23:09   CT PELVIS WO CONTRAST  Result Date: 01/27/2021 CLINICAL DATA:  Pelvic trauma, right lower extremity numbness, right hip pain EXAM: CT PELVIS WITHOUT CONTRAST TECHNIQUE: Multidetector CT imaging of the pelvis was performed following the standard protocol without intravenous contrast. COMPARISON:  Pelvis radiographs 12/21/2013 FINDINGS: Urinary Tract:  No abnormality visualized. Bowel: There is no abnormal  bowel wall thickening or inflammatory change the level imaged. Vascular/Lymphatic: The vasculature is unremarkable within the confines of noncontrast technique. There is no lymphadenopathy in the pelvis. Reproductive:  The prostate and seminal vesicles are unremarkable. Other: There is blood in the presacral space and right pericolic gutter. There is no free air to the level imaged. Musculoskeletal: There is an intramuscular hematoma in the right gluteus musculature with musculotendinous injury with probable involvement of at least the right gluteus medius and piriformis muscles. No fracture is identified. IMPRESSION: 1. Hemoperitoneum, incompletely evaluated. No free air is seen in  the pelvis. Recommend CT of the abdomen and pelvis with IV contrast for further evaluation. 2. S on the right uspected musculotendinous injuries with probable involvement of at least the right gluteus medius and piriformis muscles. 3. No fracture is identified. Electronically Signed   By: Lesia Hausen M.D.   On: 01/27/2021 14:01   MR BRAIN WO CONTRAST  Result Date: 01/27/2021 CLINICAL DATA:  Neuro deficit, stroke suspected EXAM: MRI HEAD WITHOUT CONTRAST TECHNIQUE: Multiplanar, multiecho pulse sequences of the brain and surrounding structures were obtained without intravenous contrast. COMPARISON:  No prior MRI correlation is made with 01/26/2021 CT brain. FINDINGS: Brain: Evaluation somewhat limited by motion artifact. No acute infarction, hemorrhage, hydrocephalus, mass, mass effect, or midline shift. Scattered T2 hyperintense signal in the periventricular white matter, likely the sequela of chronic small vessel ischemic disease. Vascular: Normal flow voids. Skull and upper cervical spine: Normal marrow signal. Sinuses/Orbits: Imaged sinuses are clear. The orbits are unremarkable. Other: Trace fluid in mastoid air cells. IMPRESSION: No acute intracranial process. Electronically Signed   By: Wiliam Ke M.D.   On: 01/27/2021 12:23   MR PELVIS WO CONTRAST  Result Date: 01/27/2021 CLINICAL DATA:  Unknown trauma.  Found down.  Abnormal CT scan. EXAM: MRI PELVIS WITHOUT CONTRAST, MRI RIGHT FEMUR WITHOUT CONTRAST TECHNIQUE: Multiplanar multisequence MR imaging of the pelvis was performed. No intravenous contrast was administered. COMPARISON:  CT scan, same date. FINDINGS: Significant trauma involving the right sided pelvic and hip musculature with marked increased T2 signal intensity most notably in the right gluteal muscles. Large intramuscular hematoma in the gluteus minimus muscle. Grade 2 tearing of the gluteus maximus muscle inferiorly along the musculotendinous junction. High-grade partial tearing of the  quadratus femoris muscle and the external portion of the obturator internus muscle. Both hips are normally located. No hip or pelvic fractures are identified. The pubic symphysis and SI joints are intact. The hamstring tendons are intact. Free fluid noted in the lower abdomen and pelvis but no discrete intrapelvic hematoma. Bladder is grossly normal. Prostate gland and seminal vesicles are unremarkable. IMPRESSION: 1. Significant trauma involving the right sided pelvic and hip musculature with extensive muscle tears and intramuscular and interfascial hemorrhage, edema and fluid. 2. No fractures are identified. Electronically Signed   By: Rudie Meyer M.D.   On: 01/27/2021 20:16   MR NOIBB RIGHT WO CONTRAST  Result Date: 01/27/2021 CLINICAL DATA:  Unknown trauma.  Found down.  Abnormal CT scan. EXAM: MRI PELVIS WITHOUT CONTRAST, MRI RIGHT FEMUR WITHOUT CONTRAST TECHNIQUE: Multiplanar multisequence MR imaging of the pelvis was performed. No intravenous contrast was administered. COMPARISON:  CT scan, same date. FINDINGS: Significant trauma involving the right sided pelvic and hip musculature with marked increased T2 signal intensity most notably in the right gluteal muscles. Large intramuscular hematoma in the gluteus minimus muscle. Grade 2 tearing of the gluteus maximus muscle inferiorly along the musculotendinous junction. High-grade partial tearing  of the quadratus femoris muscle and the external portion of the obturator internus muscle. Both hips are normally located. No hip or pelvic fractures are identified. The pubic symphysis and SI joints are intact. The hamstring tendons are intact. Free fluid noted in the lower abdomen and pelvis but no discrete intrapelvic hematoma. Bladder is grossly normal. Prostate gland and seminal vesicles are unremarkable. IMPRESSION: 1. Significant trauma involving the right sided pelvic and hip musculature with extensive muscle tears and intramuscular and interfascial  hemorrhage, edema and fluid. 2. No fractures are identified. Electronically Signed   By: Rudie Meyer M.D.   On: 01/27/2021 20:16        Scheduled Meds:  folic acid  1 mg Oral Daily   heparin  5,000 Units Subcutaneous Q8H   LORazepam  0-4 mg Intravenous Q12H   multivitamin with minerals  1 tablet Oral Daily   sodium bicarbonate  1,300 mg Oral BID   [START ON 02/04/2021] thiamine  100 mg Oral Daily   Continuous Infusions:  sodium chloride Stopped (01/29/21 0532)   thiamine injection 500 mg (01/29/21 0836)   Followed by   Melene Muller ON 01/30/2021] thiamine injection       LOS: 2 days    Time spent: over 30 min    Lacretia Nicks, MD [facility]   To contact the attending provider between 7A-7P or the covering provider during after hours 7P-7A, please log into the web site [email or web address] and access using universal [place] password for that web site. If you do not have the password, please call the hospital operator.  01/29/2021, 9:03 AM

## 2021-01-29 NOTE — Progress Notes (Signed)
[facility] Progress Note   Subjective:   no complaints, not really talking this AM but does say no dyspnea no difficulty voiding  Objective Vitals:   01/28/21 0742 01/28/21 1127 01/28/21 2112 01/29/21 0500  BP: (!) 147/87 (!) 149/90 (!) 157/106 (!) 144/89  Pulse: 92 90  84  Resp: 16 20 18 20   Temp: 98.8 F (37.1 C) 98.3 F (36.8 C) 98.7 F (37.1 C) 99.6 F (37.6 C)  TempSrc: Oral Oral Oral Oral  SpO2: 98% 97% 100% 100%  Weight:      Height:       Physical Exam General: lying flat in bed, sleepy Heart: RRR Lungs: clear to bases Abdomen:soft Extremities: 1+ diffuse edema  Additional Objective Labs: Basic Metabolic Panel: Recent Labs  Lab 01/27/21 1711 01/28/21 0804 01/29/21 0318  NA 129* 132* 131*  K 4.5 4.3 3.9  CL 99 103 106  CO2 18* 17* 15*  GLUCOSE 127* 90 97  BUN 61* 67* 70*  CREATININE 5.60* 7.05* 8.29*  CALCIUM 6.4* 6.7* 7.2*  PHOS  --   --  4.7*   Liver Function Tests: Recent Labs  Lab 01/27/21 1711 01/28/21 0804 01/29/21 0318  AST 1,001* 899* 813*  ALT 373* 356* 348*  ALKPHOS 54 55 50  BILITOT 0.6 0.8 0.5  PROT 5.4* 5.1* 4.9*  ALBUMIN 2.9* 2.6* 2.5*   No results for input(s): LIPASE, AMYLASE in the last 168 hours. CBC: Recent Labs  Lab 01/26/21 1600 01/27/21 1711 01/28/21 0804 01/29/21 0318  WBC 22.0* 14.4* 12.9* 11.2*  NEUTROABS 19.5* 12.2* 10.6* 8.9*  HGB 16.2 11.8* 10.7* 9.9*  HCT 47.0 33.4* 29.5* 27.9*  MCV 99.8 97.4 94.9 96.5  PLT 298 210 198 180   Blood Culture No results found for: SDES, SPECREQUEST, CULT, REPTSTATUS  Cardiac Enzymes: Recent Labs  Lab 01/27/21 1711 01/28/21 0243 01/29/21 0318  CKTOTAL >50,000* >50,000* 48,007*   CBG: No results for input(s): GLUCAP in the last 168 hours. Iron Studies: No results for input(s): IRON, TIBC, TRANSFERRIN, FERRITIN in the last 72 hours. @lablastinr3 @ Studies/Results: CT ABDOMEN WO CONTRAST  Addendum Date: 01/27/2021   ADDENDUM REPORT: 01/27/2021  23:26 ADDENDUM: These results were called by telephone at the time of interpretation on 01/27/2021 at 11:23 pm to provider Dr. 03/29/2021, who verbally acknowledged these results. Electronically Signed   By: 03/29/2021 M.D.   On: 01/27/2021 23:26   Result Date: 01/27/2021 CLINICAL DATA:  Hemoperitoneum. stumble at gas station and fall into bushes, EXAM: CT ABDOMEN AND PELVS WITHOUT CONTRAST TECHNIQUE: Multidetector CT imaging of the abdomen was performed following the standard protocol without IV contrast. COMPARISON:  X-ray chest 01/26/2021, CT pelvis 01/27/2021 FINDINGS: Lower abdomen: No acute abnormality. Liver: Not enlarged. No focal lesion. Biliary System: The gallbladder is otherwise unremarkable with no radio-opaque gallstones. No biliary ductal dilatation. Pancreas: Normal pancreatic contour. No main pancreatic duct dilatation. Spleen: Not enlarged. No focal lesion. Adrenal Glands: No nodularity bilaterally. Kidneys: Marked perinephric stranding surrounding the right kidney with free fluid coursing inferiorly along the right pericolic gutter. No hydroureteronephrosis. No nephroureterolithiasis. No contour deforming renal mass. The urinary bladder is unremarkable. Bowel: No small or large bowel wall thickening or dilatation. The appendix is unremarkable. Mesentery, Omentum, and Peritoneum: High density trace to small volume abdominal and pelvic free fluid. Fat stranding noted within the left upper quadrant and surrounding the left kidney. No pneumoperitoneum. No mesenteric hematoma identified. No organized fluid collection. Pelvic Organs: Normal. Lymph Nodes: No abdominal, pelvic, inguinal lymphadenopathy.  Vasculature: Atherosclerotic plaque. No abdominal aorta or iliac aneurysm. Musculoskeletal: Markedly asymmetric, hypodense, and heterogeneous right gluteal musculature compared to the left. Associated overlying subcutaneus soft tissue edema. Tiny fat containing umbilical hernia. No suspicious lytic or  blastic osseous lesions. No acute displaced fracture. Multilevel degenerative changes of the spine. IMPRESSION: 1. Marked perinephric stranding surrounding the right kidney with at least small volume hemorrhage within the retroperitoneum. Findings concerning for trauma. Superimposed infection not excluded. Correlate with urinalysis. 2. Markedly asymmetric, hypodense, and heterogeneous right gluteal musculature compared to the left. Findings suggestive of an underlying hematoma formation. Mass is not excluded. 3. Fat stranding noted within the left upper quadrant and surrounding the left kidney. 4.  Aortic Atherosclerosis (ICD10-I70.0). 5. Markedly limited evaluation on this noncontrast study. Recommend repeat trauma protocol with intravenous contrast (including a delayed phase of the abdomen and pelvis). Electronically Signed: By: Tish Frederickson M.D. On: 01/27/2021 23:09   CT PELVIS WO CONTRAST  Result Date: 01/27/2021 CLINICAL DATA:  Pelvic trauma, right lower extremity numbness, right hip pain EXAM: CT PELVIS WITHOUT CONTRAST TECHNIQUE: Multidetector CT imaging of the pelvis was performed following the standard protocol without intravenous contrast. COMPARISON:  Pelvis radiographs 12/21/2013 FINDINGS: Urinary Tract:  No abnormality visualized. Bowel: There is no abnormal bowel wall thickening or inflammatory change the level imaged. Vascular/Lymphatic: The vasculature is unremarkable within the confines of noncontrast technique. There is no lymphadenopathy in the pelvis. Reproductive:  The prostate and seminal vesicles are unremarkable. Other: There is blood in the presacral space and right pericolic gutter. There is no free air to the level imaged. Musculoskeletal: There is an intramuscular hematoma in the right gluteus musculature with musculotendinous injury with probable involvement of at least the right gluteus medius and piriformis muscles. No fracture is identified. IMPRESSION: 1. Hemoperitoneum,  incompletely evaluated. No free air is seen in the pelvis. Recommend CT of the abdomen and pelvis with IV contrast for further evaluation. 2. S on the right uspected musculotendinous injuries with probable involvement of at least the right gluteus medius and piriformis muscles. 3. No fracture is identified. Electronically Signed   By: Lesia Hausen M.D.   On: 01/27/2021 14:01   MR BRAIN WO CONTRAST  Result Date: 01/27/2021 CLINICAL DATA:  Neuro deficit, stroke suspected EXAM: MRI HEAD WITHOUT CONTRAST TECHNIQUE: Multiplanar, multiecho pulse sequences of the brain and surrounding structures were obtained without intravenous contrast. COMPARISON:  No prior MRI correlation is made with 01/26/2021 CT brain. FINDINGS: Brain: Evaluation somewhat limited by motion artifact. No acute infarction, hemorrhage, hydrocephalus, mass, mass effect, or midline shift. Scattered T2 hyperintense signal in the periventricular white matter, likely the sequela of chronic small vessel ischemic disease. Vascular: Normal flow voids. Skull and upper cervical spine: Normal marrow signal. Sinuses/Orbits: Imaged sinuses are clear. The orbits are unremarkable. Other: Trace fluid in mastoid air cells. IMPRESSION: No acute intracranial process. Electronically Signed   By: Wiliam Ke M.D.   On: 01/27/2021 12:23   MR PELVIS WO CONTRAST  Result Date: 01/27/2021 CLINICAL DATA:  Unknown trauma.  Found down.  Abnormal CT scan. EXAM: MRI PELVIS WITHOUT CONTRAST, MRI RIGHT FEMUR WITHOUT CONTRAST TECHNIQUE: Multiplanar multisequence MR imaging of the pelvis was performed. No intravenous contrast was administered. COMPARISON:  CT scan, same date. FINDINGS: Significant trauma involving the right sided pelvic and hip musculature with marked increased T2 signal intensity most notably in the right gluteal muscles. Large intramuscular hematoma in the gluteus minimus muscle. Grade 2 tearing of the gluteus maximus muscle inferiorly  along the  musculotendinous junction. High-grade partial tearing of the quadratus femoris muscle and the external portion of the obturator internus muscle. Both hips are normally located. No hip or pelvic fractures are identified. The pubic symphysis and SI joints are intact. The hamstring tendons are intact. Free fluid noted in the lower abdomen and pelvis but no discrete intrapelvic hematoma. Bladder is grossly normal. Prostate gland and seminal vesicles are unremarkable. IMPRESSION: 1. Significant trauma involving the right sided pelvic and hip musculature with extensive muscle tears and intramuscular and interfascial hemorrhage, edema and fluid. 2. No fractures are identified. Electronically Signed   By: Rudie Meyer M.D.   On: 01/27/2021 20:16   MR GURKY RIGHT WO CONTRAST  Result Date: 01/27/2021 CLINICAL DATA:  Unknown trauma.  Found down.  Abnormal CT scan. EXAM: MRI PELVIS WITHOUT CONTRAST, MRI RIGHT FEMUR WITHOUT CONTRAST TECHNIQUE: Multiplanar multisequence MR imaging of the pelvis was performed. No intravenous contrast was administered. COMPARISON:  CT scan, same date. FINDINGS: Significant trauma involving the right sided pelvic and hip musculature with marked increased T2 signal intensity most notably in the right gluteal muscles. Large intramuscular hematoma in the gluteus minimus muscle. Grade 2 tearing of the gluteus maximus muscle inferiorly along the musculotendinous junction. High-grade partial tearing of the quadratus femoris muscle and the external portion of the obturator internus muscle. Both hips are normally located. No hip or pelvic fractures are identified. The pubic symphysis and SI joints are intact. The hamstring tendons are intact. Free fluid noted in the lower abdomen and pelvis but no discrete intrapelvic hematoma. Bladder is grossly normal. Prostate gland and seminal vesicles are unremarkable. IMPRESSION: 1. Significant trauma involving the right sided pelvic and hip musculature with  extensive muscle tears and intramuscular and interfascial hemorrhage, edema and fluid. 2. No fractures are identified. Electronically Signed   By: Rudie Meyer M.D.   On: 01/27/2021 20:16   Medications:  sodium chloride Stopped (01/29/21 0532)   thiamine injection 500 mg (01/29/21 0836)   Followed by   Melene Muller ON 01/30/2021] thiamine injection      folic acid  1 mg Oral Daily   heparin  5,000 Units Subcutaneous Q8H   LORazepam  0-4 mg Intravenous Q12H   multivitamin with minerals  1 tablet Oral Daily   sodium bicarbonate  1,300 mg Oral BID   [START ON 02/04/2021] thiamine  100 mg Oral Daily    Assessment/Plan **Severe AKI:  suspect issue here is rhabdo but trauma/hemorrhage to R kidney could be contributing.  Stranding around L kidney noted too but no e/o pyelo on UA.  Has some blood and protein which are likely nonspecific with AKI/rhabdo.  No role for serologies or biopsy currently.   He is net +10L and has some edema now - po intake doesn't seem good so will cont MIVF at 75/hr but d/c develops more significant s/s vol overload.  Trend Cr.  May end up needing dialysis but no indication currently. He would be agreeable if needed.  Avoid hypotension and nephrotoxins as able.  Follow I/Os.    **Rhabdomyolysis: traumatic.  Hydation per above. CK trending down.    **Metabolic acidosis: secondary to AKI< titrate oral bicarb  **Hyponatremia:  mild at 131, monitor   **Anemia: mild, no major issue.     **Polysubstance abuse: counselled   Will follow, call with concerns.     Estill Bakes MD 01/29/2021, 10:17 AM  [facility] Pager: [phone]

## 2021-01-30 LAB — MAGNESIUM: Magnesium: 1.8 mg/dL (ref 1.7–2.4)

## 2021-01-30 LAB — RENAL FUNCTION PANEL
Albumin: 2.3 g/dL — ABNORMAL LOW (ref 3.5–5.0)
Anion gap: 10 (ref 5–15)
BUN: 67 mg/dL — ABNORMAL HIGH (ref 6–20)
CO2: 14 mmol/L — ABNORMAL LOW (ref 22–32)
Calcium: 8 mg/dL — ABNORMAL LOW (ref 8.9–10.3)
Chloride: 106 mmol/L (ref 98–111)
Creatinine, Ser: 9.19 mg/dL — ABNORMAL HIGH (ref 0.61–1.24)
GFR, Estimated: 6 mL/min — ABNORMAL LOW (ref >60)
Glucose, Bld: 93 mg/dL (ref 70–99)
Phosphorus: 4.7 mg/dL — ABNORMAL HIGH (ref 2.5–4.6)
Potassium: 3.9 mmol/L (ref 3.5–5.1)
Sodium: 130 mmol/L — ABNORMAL LOW (ref 135–145)

## 2021-01-30 LAB — HEPATIC FUNCTION PANEL
ALT: 313 U/L — ABNORMAL HIGH (ref 0–44)
AST: 566 U/L — ABNORMAL HIGH (ref 15–41)
Albumin: 2.3 g/dL — ABNORMAL LOW (ref 3.5–5.0)
Alkaline Phosphatase: 50 U/L (ref 38–126)
Bilirubin, Direct: 0.1 mg/dL (ref 0.0–0.2)
Indirect Bilirubin: 0.9 mg/dL (ref 0.3–0.9)
Total Bilirubin: 1 mg/dL (ref 0.3–1.2)
Total Protein: 4.9 g/dL — ABNORMAL LOW (ref 6.5–8.1)

## 2021-01-30 LAB — CK: Total CK: 24598 U/L — ABNORMAL HIGH (ref 49–397)

## 2021-01-30 MED ORDER — ALBUTEROL SULFATE (2.5 MG/3ML) 0.083% IN NEBU
3.0000 mL | INHALATION_SOLUTION | RESPIRATORY_TRACT | Status: DC | PRN
  Administered 2021-02-01: 3 mL via RESPIRATORY_TRACT
  Filled 2021-01-30: qty 3

## 2021-01-30 NOTE — Progress Notes (Signed)
PROGRESS NOTE    Frank [name]  YSA:[id number] DOB: 12/23/1970 DOA: 01/26/2021 PCP: Pcp, No    No chief complaint on file.   Brief Narrative:  50 yo AA male with hx of alcohol abuse, cocaine abuse, and THC use who presents after [name] fall.  Reportedly smelled of etoh and was slurring speech with EMS.  Initially hypotenesive, but this has improved.    Imaging without acute findings.  He's been admitted with acute kidney injury and elevated LFT's in the setting of alcohol abuse.  He's complaining of right sided numbness and imaging is currently pending.   Assessment & Plan:   Principal Problem:   AKI (acute kidney injury) (HCC) Active Problems:   Elevated LFTs   Alcohol abuse   Polysubstance abuse (HCC)   Diarrhea  Rhabdomyolysis  Acute Kidney Injury Related to cocaine use/trauma  CK ~24,598 - improving IVF per renal  Only voided 1x yesterday, bladder scan [place] without hydro  UA with protein, 6-10 RBC Urine myoglobin pending Creatinine continuing to rise today - appreciate renal assistance    Right Lower Extremity Numbness  Right Sided Hip and Pelvic Musculature Trauma  Intramuscular and Interfascial Hemorrhage, edema, and fluid  Mechanical Fall Appreciate orthopedic eval, no evidence of impending compartment syndrome - > symptoms thought related to 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 No motor deficits on exam, per ortho, sensation likely to return with time MRI brain without acute intracranial process  Hemoperitoneum CT abd/pelvis concerning for trauma - perinephric stranding around R kidney with small volume hemorrhage within retroperitoneum, asymmetric, hypodense, and heterogeneous R gluteal musculature Appreciate surgery eval and assistance - now signed off   Alcohol Abuse  CIWA protocol Encourage cessation  Elevated LFT's 2/2 etoh abuse and rhabdo, trend RUQ [place] notable for sludge Pending hep C ab - negative HIV, hep Aboubacar [name],  B  Diarrhea Follow GI panel HIV, acute hepatitis panel pending  Polysubstance abuse Encourage cessation  DVT prophylaxis: (heparin Code Status: full Family Communication: none at bedside Disposition:   Status is: inpatient  The patient will require care spanning > 2 midnights and should be moved to inpatient because: Inpatient level of care appropriate due to severity of illness  Dispo: The patient is from: Home              Anticipated d/c is to:  pending              Patient currently is not medically stable to d/c.   Difficult to place patient No       Consultants:  none  Procedures:  none  Antimicrobials:  Anti-infectives (From admission, onward)    None          Subjective: Continue RLE numbness, difficult to get to speak today again  Objective: Vitals:   01/29/21 2121 01/30/21 0356 01/30/21 0849 01/30/21 1126  BP: (!) 169/102 (!) 159/85 128/73 (!) 143/94  Pulse: 81 80 78 84  Resp: 18 20    Temp: 98.8 F (37.1 C) (!) 97.5 F (36.4 C)  98.1 F (36.7 C)  TempSrc: Oral Oral  Oral  SpO2: 100% 98% 95% 97%  Weight:  67.4 kg    Height:        Intake/Output Summary (Last 24 hours) at 01/30/2021 1131 Last data filed at 01/30/2021 0538 Gross per 24 hour  Intake 630 ml  Output 600 ml  Net 30 ml   Filed Weights   01/27/21 2200 01/28/21 0500 01/30/21  0356  Weight: 62 kg 64.4 kg 67.4 kg    Examination: General: No acute distress. Cardiovascular: RRR Lungs: unlabored Abdomen: Soft, nontender, nondistended  Neurological: Alert and oriented 3. Moves all extremities 4 . Cranial nerves II through XII grossly intact.  Subjective decreased sensation to light touch. Extremities: intact strength to RLE, RLE edema noted - compartments soft    Data Reviewed: I have personally reviewed following labs and imaging studies  CBC: Recent Labs  Lab 01/26/21 1600 01/27/21 1711 01/28/21 0804 01/29/21 0318  WBC 22.0* 14.4* 12.9* 11.2*  NEUTROABS 19.5*  12.2* 10.6* 8.9*  HGB 16.2 11.8* 10.7* 9.9*  HCT 47.0 33.4* 29.5* 27.9*  MCV 99.8 97.4 94.9 96.5  PLT 298 210 198 180    Basic Metabolic Panel: Recent Labs  Lab 01/26/21 1600 01/27/21 1711 01/28/21 0804 01/29/21 0318 01/30/21 0307  NA 136 129* 132* 131* 130*  K 4.9 4.5 4.3 3.9 3.9  CL 96* 99 103 106 106  CO2 20* 18* 17* 15* 14*  GLUCOSE 136* 127* 90 97 93  BUN 39* 61* 67* 70* 67*  CREATININE 3.86* 5.60* 7.05* 8.29* 9.19*  CALCIUM 8.0* 6.4* 6.7* 7.2* 8.0*  MG  --  2.0  --  1.8 1.8  PHOS  --   --   --  4.7* 4.7*    GFR: Estimated Creatinine Clearance: 9 mL/min ([name]) (by C-G formula based on SCr of 9.19 mg/dL (H)).  Liver Function Tests: Recent Labs  Lab 01/26/21 1600 01/27/21 1711 01/28/21 0804 01/29/21 0318 01/30/21 0307  AST 1,140* 1,001* 899* 813* 566*  ALT 392* 373* 356* 348* 313*  ALKPHOS 64 54 55 50 50  BILITOT 0.8 0.6 0.8 0.5 1.0  PROT 8.0 5.4* 5.1* 4.9* 4.9*  ALBUMIN 4.4 2.9* 2.6* 2.5* 2.3*  2.3*    CBG: No results for input(s): GLUCAP in the last 168 hours.   Recent Results (from the past 240 hour(s))  Resp Panel by RT-PCR (Flu Kenidi Elenbaas&B, Covid) Nasopharyngeal Swab     Status: None   Collection Time: 01/26/21  4:06 PM   Specimen: Nasopharyngeal Swab; Nasopharyngeal(NP) swabs in vial transport medium  Result Value Ref Range Status   SARS Coronavirus 2 by RT PCR NEGATIVE NEGATIVE Final    Comment: (NOTE) SARS-CoV-2 target nucleic acids are NOT DETECTED.  The SARS-CoV-2 RNA is generally detectable in upper respiratory specimens during the acute phase of infection. The lowest concentration of SARS-CoV-2 viral copies this assay can detect is 138 copies/mL. Jalacia Mattila negative result does not preclude SARS-Cov-2 infection and should not be used as the sole basis for treatment or other patient management decisions. Rosela Supak negative result may occur with  improper specimen collection/handling, submission of specimen other than nasopharyngeal swab, presence of viral  mutation(s) within the areas targeted by this assay, and inadequate number of viral copies(<138 copies/mL). Gearldine Looney negative result must be combined with clinical observations, patient history, and epidemiological information. The expected result is Negative.  Fact Sheet for Patients:  BloggerCourse.com  Fact Sheet for Healthcare Providers:  SeriousBroker.it  This test is no t yet approved or cleared by the [place] FDA and  has been authorized for detection and/or diagnosis of SARS-CoV-2 by FDA under an Emergency Use Authorization (EUA). This EUA will remain  in effect (meaning this test can be used) for the duration of the COVID-19 declaration under Section 564(b)(1) of the Act, 21 U.S.C.section 360bbb-3(b)(1), unless the authorization is terminated  or revoked sooner.       Influenza Rickie Gange  by PCR NEGATIVE NEGATIVE Final   Influenza B by PCR NEGATIVE NEGATIVE Final    Comment: (NOTE) The Xpert Xpress SARS-CoV-2/FLU/RSV plus assay is intended as an aid in the diagnosis of influenza from Nasopharyngeal swab specimens and should not be used as Haden Cavenaugh sole basis for treatment. Nasal washings and aspirates are unacceptable for Xpert Xpress SARS-CoV-2/FLU/RSV testing.  Fact Sheet for Patients: BloggerCourse.com  Fact Sheet for Healthcare Providers: SeriousBroker.it  This test is not yet approved or cleared by the [place] FDA and has been authorized for detection and/or diagnosis of SARS-CoV-2 by FDA under an Emergency Use Authorization (EUA). This EUA will remain in effect (meaning this test can be used) for the duration of the COVID-19 declaration under Section 564(b)(1) of the Act, 21 U.S.C. section 360bbb-3(b)(1), unless the authorization is terminated or revoked.  Performed at [facility], [address], [place], [place] [address]   Urine Culture     Status: None    Collection Time: 01/27/21  7:33 PM   Specimen: Urine, Clean Catch  Result Value Ref Range Status   Specimen Description URINE, CLEAN CATCH  Final   Special Requests NONE  Final   Culture   Final    NO GROWTH Performed at [facility], [address], [place], [place] [address]    Report Status 01/29/2021 FINAL  Final  Gastrointestinal Panel by PCR , Stool     Status: None   Collection Time: 01/28/21  3:46 PM   Specimen: Stool  Result Value Ref Range Status   Campylobacter species NOT DETECTED NOT DETECTED Final   Plesimonas shigelloides NOT DETECTED NOT DETECTED Final   Salmonella species NOT DETECTED NOT DETECTED Final   Yersinia enterocolitica NOT DETECTED NOT DETECTED Final   Vibrio species NOT DETECTED NOT DETECTED Final   Vibrio cholerae NOT DETECTED NOT DETECTED Final   Enteroaggregative E coli (EAEC) NOT DETECTED NOT DETECTED Final   Enteropathogenic E coli (EPEC) NOT DETECTED NOT DETECTED Final   Enterotoxigenic E coli (ETEC) NOT DETECTED NOT DETECTED Final   Shiga like toxin producing E coli (STEC) NOT DETECTED NOT DETECTED Final   Shigella/Enteroinvasive E coli (EIEC) NOT DETECTED NOT DETECTED Final   Cryptosporidium NOT DETECTED NOT DETECTED Final   Cyclospora cayetanensis NOT DETECTED NOT DETECTED Final   Entamoeba histolytica NOT DETECTED NOT DETECTED Final   Giardia lamblia NOT DETECTED NOT DETECTED Final   Adenovirus F40/41 NOT DETECTED NOT DETECTED Final   Astrovirus NOT DETECTED NOT DETECTED Final   Norovirus GI/GII NOT DETECTED NOT DETECTED Final   Rotavirus Adem Costlow NOT DETECTED NOT DETECTED Final   Sapovirus (I, II, IV, and V) NOT DETECTED NOT DETECTED Final    Comment: Performed at [facility], [address], [place], [place] [address]         Radiology Studies: No results found.      Scheduled Meds:  folic acid  1 mg Oral Daily   heparin  5,000 Units Subcutaneous Q8H   LORazepam  0-4 mg Intravenous Q12H   multivitamin with minerals   1 tablet Oral Daily   sodium bicarbonate  1,300 mg Oral BID   [START ON 02/04/2021] thiamine  100 mg Oral Daily   Continuous Infusions:  thiamine injection       LOS: 3 days    Time spent: over 30 min    Lacretia Nicks, MD [facility]   To contact the attending provider between 7A-7P or the covering provider during after  hours 7P-7A, please log into the web site [email or web address] and access using universal [place] password for that web site. If you do not have the password, please call the hospital operator.  01/30/2021, 11:31 AM

## 2021-01-30 NOTE — Progress Notes (Signed)
[facility] Progress Note   Subjective:   no complaints except ongoing abd/back pain.  Poor po intake reported. Fiance bedside this AM who provides much of this history as pt still not willing to talk much.  UOP improved to yesterday.   Objective Vitals:   01/29/21 0500 01/29/21 1053 01/29/21 2121 01/30/21 0356  BP: (!) 144/89 (!) 144/97 (!) 169/102 (!) 159/85  Pulse: 84 89 81 80  Resp: 20 20 18 20   Temp: 99.6 F (37.6 C) 98.7 F (37.1 C) 98.8 F (37.1 C) (!) 97.5 F (36.4 C)  TempSrc: Oral Oral Oral Oral  SpO2: 100% 100% 100% 98%  Weight:    67.4 kg  Height:       Physical Exam General: lying flat in bed, sleepy Heart: RRR Lungs: clear to bases Abdomen:soft Extremities: 1+ diffuse edema - not worse  Additional Objective Labs: Basic Metabolic Panel: Recent Labs  Lab 01/28/21 0804 01/29/21 0318 01/30/21 0307  NA 132* 131* 130*  K 4.3 3.9 3.9  CL 103 106 106  CO2 17* 15* 14*  GLUCOSE 90 97 93  BUN 67* 70* 67*  CREATININE 7.05* 8.29* 9.19*  CALCIUM 6.7* 7.2* 8.0*  PHOS  --  4.7* 4.7*    Liver Function Tests: Recent Labs  Lab 01/28/21 0804 01/29/21 0318 01/30/21 0307  AST 899* 813* 566*  ALT 356* 348* 313*  ALKPHOS 55 50 50  BILITOT 0.8 0.5 1.0  PROT 5.1* 4.9* 4.9*  ALBUMIN 2.6* 2.5* 2.3*  2.3*    No results for input(s): LIPASE, AMYLASE in the last 168 hours. CBC: Recent Labs  Lab 01/26/21 1600 01/27/21 1711 01/28/21 0804 01/29/21 0318  WBC 22.0* 14.4* 12.9* 11.2*  NEUTROABS 19.5* 12.2* 10.6* 8.9*  HGB 16.2 11.8* 10.7* 9.9*  HCT 47.0 33.4* 29.5* 27.9*  MCV 99.8 97.4 94.9 96.5  PLT 298 210 198 180    Blood Culture    Component Value Date/Time   SDES URINE, CLEAN CATCH 01/27/2021 1933   SPECREQUEST NONE 01/27/2021 1933   CULT  01/27/2021 1933    NO GROWTH Performed at [facility], [address], [place], [place]    REPTSTATUS 01/29/2021 FINAL 01/27/2021 1933    Cardiac Enzymes: Recent Labs   Lab 01/27/21 1711 01/28/21 0243 01/29/21 0318  CKTOTAL >50,000* >50,000* 48,007*    CBG: No results for input(s): GLUCAP in the last 168 hours. Iron Studies: No results for input(s): IRON, TIBC, TRANSFERRIN, FERRITIN in the last 72 hours. @lablastinr3 @ Studies/Results: No results found. Medications:  sodium chloride 75 mL/hr at 01/29/21 2148   thiamine injection      folic acid  1 mg Oral Daily   heparin  5,000 Units Subcutaneous Q8H   LORazepam  0-4 mg Intravenous Q12H   multivitamin with minerals  1 tablet Oral Daily   sodium bicarbonate  1,300 mg Oral BID   [START ON 02/04/2021] thiamine  100 mg Oral Daily    Assessment/Plan **Severe AKI:  suspect issue here is rhabdo but trauma/hemorrhage to R kidney could be contributing.  Stranding around L kidney noted too but no e/o pyelo on UA.  Has some blood and protein which are likely nonspecific with AKI/rhabdo.  No role for serologies or biopsy currently.   He is net +11L and has some edema now - will cont MIVF at 75/hr given poor po intake today but d/c if develops more significant s/s vol overload.  Trend Cr.  May end up needing dialysis  but no indication currently. He would be agreeable if needed.  Avoid hypotension and nephrotoxins as able.  Follow I/Os.   **Abd pain: suspect related to traumatic RP bleeding.  Doesn't seem to be worsening.  Further eval per primary if worsening.    **Rhabdomyolysis: traumatic.  Hydation per above. CK trending down.    **Metabolic acidosis: secondary to AKI< titrate oral bicarb  **Hyponatremia:  mild at 130-131, monitor   **Anemia: stable in 9-10 range.   **Polysubstance abuse: counselled   Will follow, call with concerns.    Estill Bakes MD 01/30/2021, 7:40 AM  [facility] Pager: [phone]

## 2021-01-31 LAB — CBC WITH DIFFERENTIAL/PLATELET
Abs Immature Granulocytes: 0.05 K/uL (ref 0.00–0.07)
Basophils Absolute: 0 K/uL (ref 0.0–0.1)
Basophils Relative: 0 %
Eosinophils Absolute: 0.3 K/uL (ref 0.0–0.5)
Eosinophils Relative: 2 %
HCT: 27.1 % — ABNORMAL LOW (ref 39.0–52.0)
Hemoglobin: 9.6 g/dL — ABNORMAL LOW (ref 13.0–17.0)
Immature Granulocytes: 1 %
Lymphocytes Relative: 16 %
Lymphs Abs: 1.8 K/uL (ref 0.7–4.0)
MCH: 33.9 pg (ref 26.0–34.0)
MCHC: 35.4 g/dL (ref 30.0–36.0)
MCV: 95.8 fL (ref 80.0–100.0)
Monocytes Absolute: 1.1 K/uL — ABNORMAL HIGH (ref 0.1–1.0)
Monocytes Relative: 10 %
Neutro Abs: 7.6 K/uL (ref 1.7–7.7)
Neutrophils Relative %: 71 %
Platelets: 246 K/uL (ref 150–400)
RBC: 2.83 MIL/uL — ABNORMAL LOW (ref 4.22–5.81)
RDW: 12.2 % (ref 11.5–15.5)
WBC: 10.8 K/uL — ABNORMAL HIGH (ref 4.0–10.5)
nRBC: 0 % (ref 0.0–0.2)

## 2021-01-31 LAB — COMPREHENSIVE METABOLIC PANEL
ALT: 266 U/L — ABNORMAL HIGH (ref 0–44)
AST: 378 U/L — ABNORMAL HIGH (ref 15–41)
Albumin: 2.2 g/dL — ABNORMAL LOW (ref 3.5–5.0)
Alkaline Phosphatase: 49 U/L (ref 38–126)
Anion gap: 12 (ref 5–15)
BUN: 71 mg/dL — ABNORMAL HIGH (ref 6–20)
CO2: 15 mmol/L — ABNORMAL LOW (ref 22–32)
Calcium: 8.5 mg/dL — ABNORMAL LOW (ref 8.9–10.3)
Chloride: 104 mmol/L (ref 98–111)
Creatinine, Ser: 10.17 mg/dL — ABNORMAL HIGH (ref 0.61–1.24)
GFR, Estimated: 6 mL/min — ABNORMAL LOW (ref >60)
Glucose, Bld: 122 mg/dL — ABNORMAL HIGH (ref 70–99)
Potassium: 4 mmol/L (ref 3.5–5.1)
Sodium: 131 mmol/L — ABNORMAL LOW (ref 135–145)
Total Bilirubin: 0.8 mg/dL (ref 0.3–1.2)
Total Protein: 4.8 g/dL — ABNORMAL LOW (ref 6.5–8.1)

## 2021-01-31 LAB — CK: Total CK: 13285 U/L — ABNORMAL HIGH (ref 49–397)

## 2021-01-31 MED ORDER — LORAZEPAM 2 MG/ML IJ SOLN
2.0000 mg | INTRAMUSCULAR | Status: DC | PRN
  Administered 2021-01-31 – 2021-02-03 (×5): 2 mg via INTRAVENOUS
  Filled 2021-01-31 (×5): qty 1

## 2021-01-31 NOTE — Care Management (Signed)
MATCH for medications done, patient listed address is the [facility].   [facility] Medication Assistance Card Name:  Frank [name] ID: [id number] Bin: [id number] RX Group: [id number] Discharge Date:  01/31/21 Expiration Date:  02/07/21 (must be filled within 7 days of discharge)

## 2021-01-31 NOTE — Progress Notes (Signed)
[facility] Progress Note   Subjective:   no complaints except ongoing abd/back pain.  Po intake improved. Fiance bedside this AM who provides much of this history but patient is more talkative now.  No uremic symptoms reproted.  UOP improved to yesterday.   Objective Vitals:   01/30/21 1126 01/30/21 1940 01/31/21 0116 01/31/21 0343  BP: (!) 143/94 (!) 170/103  (!) 153/88  Pulse: 84 85  80  Resp:  17  18  Temp: 98.1 F (36.7 C) 98.1 F (36.7 C)  98.6 F (37 C)  TempSrc: Oral Oral  Oral  SpO2: 97% 99%  100%  Weight:   69 kg   Height:       Physical Exam General: lying flat in bed, talking more today Heart: RRR Lungs: clear to bases Abdomen:soft Extremities: trace edema - not worse  Additional Objective Labs: Basic Metabolic Panel: Recent Labs  Lab 01/29/21 0318 01/30/21 0307 01/31/21 0219  NA 131* 130* 131*  K 3.9 3.9 4.0  CL 106 106 104  CO2 15* 14* 15*  GLUCOSE 97 93 122*  BUN 70* 67* 71*  CREATININE 8.29* 9.19* 10.17*  CALCIUM 7.2* 8.0* 8.5*  PHOS 4.7* 4.7*  --     Liver Function Tests: Recent Labs  Lab 01/29/21 0318 01/30/21 0307 01/31/21 0219  AST 813* 566* 378*  ALT 348* 313* 266*  ALKPHOS 50 50 49  BILITOT 0.5 1.0 0.8  PROT 4.9* 4.9* 4.8*  ALBUMIN 2.5* 2.3*  2.3* 2.2*    No results for input(s): LIPASE, AMYLASE in the last 168 hours. CBC: Recent Labs  Lab 01/26/21 1600 01/27/21 1711 01/28/21 0804 01/29/21 0318 01/31/21 0219  WBC 22.0* 14.4* 12.9* 11.2* 10.8*  NEUTROABS 19.5* 12.2* 10.6* 8.9* 7.6  HGB 16.2 11.8* 10.7* 9.9* 9.6*  HCT 47.0 33.4* 29.5* 27.9* 27.1*  MCV 99.8 97.4 94.9 96.5 95.8  PLT 298 210 198 180 246    Blood Culture    Component Value Date/Time   SDES URINE, CLEAN CATCH 01/27/2021 1933   SPECREQUEST NONE 01/27/2021 1933   CULT  01/27/2021 1933    NO GROWTH Performed at [facility], [address], [place], [place] [address]    REPTSTATUS 01/29/2021 FINAL 01/27/2021 1933     Cardiac Enzymes: Recent Labs  Lab 01/27/21 1711 01/28/21 0243 01/29/21 0318 01/30/21 0307 01/31/21 0219  CKTOTAL >50,000* >50,000* 48,007* 24,598* 13,285*    CBG: No results for input(s): GLUCAP in the last 168 hours. Iron Studies: No results for input(s): IRON, TIBC, TRANSFERRIN, FERRITIN in the last 72 hours. @lablastinr3 @ Studies/Results: No results found. Medications:  thiamine injection 250 mg (01/30/21 2242)    folic acid  1 mg Oral Daily   heparin  5,000 Units Subcutaneous Q8H   LORazepam  0-4 mg Intravenous Q12H   multivitamin with minerals  1 tablet Oral Daily   sodium bicarbonate  1,300 mg Oral BID   [START ON 02/04/2021] thiamine  100 mg Oral Daily    Assessment/Plan **Severe AKI:  Normal baseline renal function and now severe AKI in setting of rhabdo CK > 50k +trauma/hemorrhage to R kidney could be contributing. No documented hyoptension but was found down so possible ischemic injury as well.  No role for serologies or biopsy currently.   No current indications for dialysis and UOP is increasing each day which is encouraging.   May end up needing dialysis but no indication currently. He would be agreeable if needed.  Avoid hypotension and nephrotoxins  as able.  Follow I/Os.   **Abd pain: suspect related to traumatic RP bleeding.  Doesn't seem to be worsening.  Further eval per primary if worsening.    **Rhabdomyolysis: traumatic.  Hydation per above. CK trending down.    **Metabolic acidosis: secondary to AKI< titrated oral bicarb  **Hyponatremia:  mild at 130-131, monitor   **Anemia: stable in 9-10 range.   **Polysubstance abuse: counselled, thiamine   Will follow, call with concerns.    Estill Bakes MD 01/31/2021, 7:43 AM  [facility] Pager: [phone]

## 2021-01-31 NOTE — Progress Notes (Signed)
PROGRESS NOTE    Frank [name]  NLZ:[id number] DOB: 03-02-1971 DOA: 01/26/2021 PCP: Pcp, No    No chief complaint on file.   Brief Narrative:  50 yo AA male with hx of alcohol abuse, cocaine abuse, and THC use who presents after Joyel Chenette fall.  Reportedly smelled of etoh and was slurring speech with EMS.  Initially hypotenesive, but this has improved.    Imaging without acute findings.  He's been admitted with acute kidney injury and elevated LFT's in the setting of alcohol abuse.  He's complaining of right sided numbness and imaging is currently pending.   Assessment & Plan:   Principal Problem:   AKI (acute kidney injury) (HCC) Active Problems:   Elevated LFTs   Alcohol abuse   Polysubstance abuse (HCC)   Diarrhea  Rhabdomyolysis  Acute Kidney Injury Related to cocaine use/trauma  CK improving IVF per renal  UOP improving [place] without hydro  UA with protein, 6-10 RBC Urine myoglobin pending Creatinine continuing to rise today - appreciate renal assistance    Right Lower Extremity Numbness  Right Sided Hip and Pelvic Musculature Trauma  Intramuscular and Interfascial Hemorrhage, edema, and fluid  Mechanical Fall Appreciate orthopedic eval, no evidence of impending compartment syndrome - > symptoms thought related to 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 No motor deficits on exam, per ortho, sensation likely to return with time MRI brain without acute intracranial process  Hemoperitoneum CT abd/pelvis concerning for trauma - perinephric stranding around R kidney with small volume hemorrhage within retroperitoneum, asymmetric, hypodense, and heterogeneous R gluteal musculature Appreciate surgery eval and assistance - now signed off   Alcohol Abuse  CIWA protocol Encourage cessation  Elevated LFT's 2/2 etoh abuse and rhabdo, trend - improving RUQ [place] notable for sludge hep C ab negatiev (says pending, unsure why, comment notes  negative) negative HIV, hep Ameena Vesey, B  Diarrhea Follow GI panel - negative HIV, acute hepatitis panel as above  Polysubstance abuse Encourage cessation  DVT prophylaxis: (heparin Code Status: full Family Communication: none at bedside Disposition:   Status is: inpatient  The patient will require care spanning > 2 midnights and should be moved to inpatient because: Inpatient level of care appropriate due to severity of illness  Dispo: The patient is from: Home              Anticipated d/c is to:  pending              Patient currently is not medically stable to d/c.   Difficult to place patient No       Consultants:  none  Procedures:  none  Antimicrobials:  Anti-infectives (From admission, onward)    None          Subjective: C/o swelling and discomfrot in RLE  Objective: Vitals:   01/30/21 1940 01/31/21 0116 01/31/21 0343 01/31/21 1324  BP: (!) 170/103  (!) 153/88 (!) 153/99  Pulse: 85  80 81  Resp: 17  18 18   Temp: 98.1 F (36.7 C)  98.6 F (37 C) 98.6 F (37 C)  TempSrc: Oral  Oral Oral  SpO2: 99%  100% 96%  Weight:  69 kg    Height:        Intake/Output Summary (Last 24 hours) at 01/31/2021 1441 Last data filed at 01/31/2021 1329 Gross per 24 hour  Intake 580 ml  Output 1350 ml  Net -770 ml   Filed Weights   01/28/21 0500 01/30/21 0356 01/31/21  0116  Weight: 64.4 kg 67.4 kg 69 kg    Examination: General: No acute distress. Cardiovascular: RRR Lungs: unlabored Abdomen: Soft, nontender, nondistended  Neurological: Alert and oriented 3. Moves all extremities 4. Cranial nerves II through XII grossly intact.  Subjective numbness to RLE Extremities: RLE edema, soft compartments, mild TTP at buttocks    Data Reviewed: I have personally reviewed following labs and imaging studies  CBC: Recent Labs  Lab 01/26/21 1600 01/27/21 1711 01/28/21 0804 01/29/21 0318 01/31/21 0219  WBC 22.0* 14.4* 12.9* 11.2* 10.8*  NEUTROABS 19.5* 12.2*  10.6* 8.9* 7.6  HGB 16.2 11.8* 10.7* 9.9* 9.6*  HCT 47.0 33.4* 29.5* 27.9* 27.1*  MCV 99.8 97.4 94.9 96.5 95.8  PLT 298 210 198 180 246    Basic Metabolic Panel: Recent Labs  Lab 01/27/21 1711 01/28/21 0804 01/29/21 0318 01/30/21 0307 01/31/21 0219  NA 129* 132* 131* 130* 131*  K 4.5 4.3 3.9 3.9 4.0  CL 99 103 106 106 104  CO2 18* 17* 15* 14* 15*  GLUCOSE 127* 90 97 93 122*  BUN 61* 67* 70* 67* 71*  CREATININE 5.60* 7.05* 8.29* 9.19* 10.17*  CALCIUM 6.4* 6.7* 7.2* 8.0* 8.5*  MG 2.0  --  1.8 1.8  --   PHOS  --   --  4.7* 4.7*  --     GFR: Estimated Creatinine Clearance: 8.2 mL/min (Frank [name]) (by C-G formula based on SCr of 10.17 mg/dL (H)).  Liver Function Tests: Recent Labs  Lab 01/27/21 1711 01/28/21 0804 01/29/21 0318 01/30/21 0307 01/31/21 0219  AST 1,001* 899* 813* 566* 378*  ALT 373* 356* 348* 313* 266*  ALKPHOS 54 55 50 50 49  BILITOT 0.6 0.8 0.5 1.0 0.8  PROT 5.4* 5.1* 4.9* 4.9* 4.8*  ALBUMIN 2.9* 2.6* 2.5* 2.3*  2.3* 2.2*    CBG: No results for input(s): GLUCAP in the last 168 hours.   Recent Results (from the past 240 hour(s))  Resp Panel by RT-PCR (Flu Charnay Nazario&B, Covid) Nasopharyngeal Swab     Status: None   Collection Time: 01/26/21  4:06 PM   Specimen: Nasopharyngeal Swab; Nasopharyngeal(NP) swabs in vial transport medium  Result Value Ref Range Status   SARS Coronavirus 2 by RT PCR NEGATIVE NEGATIVE Final    Comment: (NOTE) SARS-CoV-2 target nucleic acids are NOT DETECTED.  The SARS-CoV-2 RNA is generally detectable in upper respiratory specimens during the acute phase of infection. The lowest concentration of SARS-CoV-2 viral copies this assay can detect is 138 copies/mL. Dafney Farler negative result does not preclude SARS-Cov-2 infection and should not be used as the sole basis for treatment or other patient management decisions. Shaquel Josephson negative result may occur with  improper specimen collection/handling, submission of specimen other than nasopharyngeal swab,  presence of viral mutation(s) within the areas targeted by this assay, and inadequate number of viral copies(<138 copies/mL). Allaya Abbasi negative result must be combined with clinical observations, patient history, and epidemiological information. The expected result is Negative.  Fact Sheet for Patients:  BloggerCourse.com  Fact Sheet for Healthcare Providers:  SeriousBroker.it  This test is no t yet approved or cleared by the [place] FDA and  has been authorized for detection and/or diagnosis of SARS-CoV-2 by FDA under an Emergency Use Authorization (EUA). This EUA will remain  in effect (meaning this test can be used) for the duration of the COVID-19 declaration under Section 564(b)(1) of the Act, 21 U.S.C.section 360bbb-3(b)(1), unless the authorization is terminated  or revoked sooner.  Influenza Haadi Santellan by PCR NEGATIVE NEGATIVE Final   Influenza B by PCR NEGATIVE NEGATIVE Final    Comment: (NOTE) The Xpert Xpress SARS-CoV-2/FLU/RSV plus assay is intended as an aid in the diagnosis of influenza from Nasopharyngeal swab specimens and should not be used as Rochella Benner sole basis for treatment. Nasal washings and aspirates are unacceptable for Xpert Xpress SARS-CoV-2/FLU/RSV testing.  Fact Sheet for Patients: BloggerCourse.com  Fact Sheet for Healthcare Providers: SeriousBroker.it  This test is not yet approved or cleared by the [place] FDA and has been authorized for detection and/or diagnosis of SARS-CoV-2 by FDA under an Emergency Use Authorization (EUA). This EUA will remain in effect (meaning this test can be used) for the duration of the COVID-19 declaration under Section 564(b)(1) of the Act, 21 U.S.C. section 360bbb-3(b)(1), unless the authorization is terminated or revoked.  Performed at [facility], [address], [place], [place] [address]   Urine Culture      Status: None   Collection Time: 01/27/21  7:33 PM   Specimen: Urine, Clean Catch  Result Value Ref Range Status   Specimen Description URINE, CLEAN CATCH  Final   Special Requests NONE  Final   Culture   Final    NO GROWTH Performed at [facility], [address] [place], [place] [address]    Report Status 01/29/2021 FINAL  Final  Gastrointestinal Panel by PCR , Stool     Status: None   Collection Time: 01/28/21  3:46 PM   Specimen: Stool  Result Value Ref Range Status   Campylobacter species NOT DETECTED NOT DETECTED Final   Plesimonas shigelloides NOT DETECTED NOT DETECTED Final   Salmonella species NOT DETECTED NOT DETECTED Final   Yersinia enterocolitica NOT DETECTED NOT DETECTED Final   Vibrio species NOT DETECTED NOT DETECTED Final   Vibrio cholerae NOT DETECTED NOT DETECTED Final   Enteroaggregative E coli (EAEC) NOT DETECTED NOT DETECTED Final   Enteropathogenic E coli (EPEC) NOT DETECTED NOT DETECTED Final   Enterotoxigenic E coli (ETEC) NOT DETECTED NOT DETECTED Final   Shiga like toxin producing E coli (STEC) NOT DETECTED NOT DETECTED Final   Shigella/Enteroinvasive E coli (EIEC) NOT DETECTED NOT DETECTED Final   Cryptosporidium NOT DETECTED NOT DETECTED Final   Cyclospora cayetanensis NOT DETECTED NOT DETECTED Final   Entamoeba histolytica NOT DETECTED NOT DETECTED Final   Giardia lamblia NOT DETECTED NOT DETECTED Final   Adenovirus F40/41 NOT DETECTED NOT DETECTED Final   Astrovirus NOT DETECTED NOT DETECTED Final   Norovirus GI/GII NOT DETECTED NOT DETECTED Final   Rotavirus Harjot Zavadil NOT DETECTED NOT DETECTED Final   Sapovirus (I, II, IV, and V) NOT DETECTED NOT DETECTED Final    Comment: Performed at [facility], [address], [place], [place] [address]         Radiology Studies: No results found.      Scheduled Meds:  folic acid  1 mg Oral Daily   heparin  5,000 Units Subcutaneous Q8H   multivitamin with minerals  1 tablet Oral Daily    sodium bicarbonate  1,300 mg Oral BID   [START ON 02/04/2021] thiamine  100 mg Oral Daily   Continuous Infusions:  thiamine injection Stopped (01/31/21 1013)     LOS: 4 days    Time spent: over 30 min    Lacretia Nicks, MD [facility]   To contact the attending provider between 7A-7P or the covering provider during after hours 7P-7A, please log into  the web site [email or web address] and access using universal [place] password for that web site. If you do not have the password, please call the hospital operator.  01/31/2021, 2:41 PM

## 2021-02-01 ENCOUNTER — Inpatient Hospital Stay (HOSPITAL_COMMUNITY): Payer: Self-pay

## 2021-02-01 LAB — COMPREHENSIVE METABOLIC PANEL
ALT: 238 U/L — ABNORMAL HIGH (ref 0–44)
AST: 247 U/L — ABNORMAL HIGH (ref 15–41)
Albumin: 2.4 g/dL — ABNORMAL LOW (ref 3.5–5.0)
Alkaline Phosphatase: 50 U/L (ref 38–126)
Anion gap: 11 (ref 5–15)
BUN: 73 mg/dL — ABNORMAL HIGH (ref 6–20)
CO2: 17 mmol/L — ABNORMAL LOW (ref 22–32)
Calcium: 8.8 mg/dL — ABNORMAL LOW (ref 8.9–10.3)
Chloride: 106 mmol/L (ref 98–111)
Creatinine, Ser: 10.38 mg/dL — ABNORMAL HIGH (ref 0.61–1.24)
GFR, Estimated: 5 mL/min — ABNORMAL LOW (ref >60)
Glucose, Bld: 103 mg/dL — ABNORMAL HIGH (ref 70–99)
Potassium: 4.4 mmol/L (ref 3.5–5.1)
Sodium: 134 mmol/L — ABNORMAL LOW (ref 135–145)
Total Bilirubin: 0.9 mg/dL (ref 0.3–1.2)
Total Protein: 5.1 g/dL — ABNORMAL LOW (ref 6.5–8.1)

## 2021-02-01 LAB — CK: Total CK: 8360 U/L — ABNORMAL HIGH (ref 49–397)

## 2021-02-01 LAB — CBC WITH DIFFERENTIAL/PLATELET
Abs Immature Granulocytes: 0.07 10*3/uL (ref 0.00–0.07)
Basophils Absolute: 0 10*3/uL (ref 0.0–0.1)
Basophils Relative: 0 %
Eosinophils Absolute: 0.3 10*3/uL (ref 0.0–0.5)
Eosinophils Relative: 3 %
HCT: 27.4 % — ABNORMAL LOW (ref 39.0–52.0)
Hemoglobin: 9.9 g/dL — ABNORMAL LOW (ref 13.0–17.0)
Immature Granulocytes: 1 %
Lymphocytes Relative: 15 %
Lymphs Abs: 1.6 10*3/uL (ref 0.7–4.0)
MCH: 33.9 pg (ref 26.0–34.0)
MCHC: 36.1 g/dL — ABNORMAL HIGH (ref 30.0–36.0)
MCV: 93.8 fL (ref 80.0–100.0)
Monocytes Absolute: 1.6 10*3/uL — ABNORMAL HIGH (ref 0.1–1.0)
Monocytes Relative: 14 %
Neutro Abs: 7.6 10*3/uL (ref 1.7–7.7)
Neutrophils Relative %: 67 %
Platelets: 283 10*3/uL (ref 150–400)
RBC: 2.92 MIL/uL — ABNORMAL LOW (ref 4.22–5.81)
RDW: 12.3 % (ref 11.5–15.5)
WBC: 11.2 10*3/uL — ABNORMAL HIGH (ref 4.0–10.5)
nRBC: 0 % (ref 0.0–0.2)

## 2021-02-01 LAB — PHOSPHORUS: Phosphorus: 6.4 mg/dL — ABNORMAL HIGH (ref 2.5–4.6)

## 2021-02-01 LAB — MAGNESIUM: Magnesium: 1.9 mg/dL (ref 1.7–2.4)

## 2021-02-01 IMAGING — CT CT ABD-PELV W/O CM
2 of 4 series · 16 of 46 positions shown, 18 images · non-contrast
Comparison: [DATE]

CLINICAL DATA: Retroperitoneal hematoma suspected hemoperitoneum

EXAM:
CT ABDOMEN AND PELVIS WITHOUT CONTRAST
TECHNIQUE: Multidetector CT imaging of the abdomen and pelvis was performed
following the standard protocol without IV contrast.

[Series 3: a/p w/o 5mm · axial · non-contrast · 0.84mm/px · z∈[-469,-29]mm · 13 of 96 slices shown, 15 images]
[im 4/96  soft-tissue]
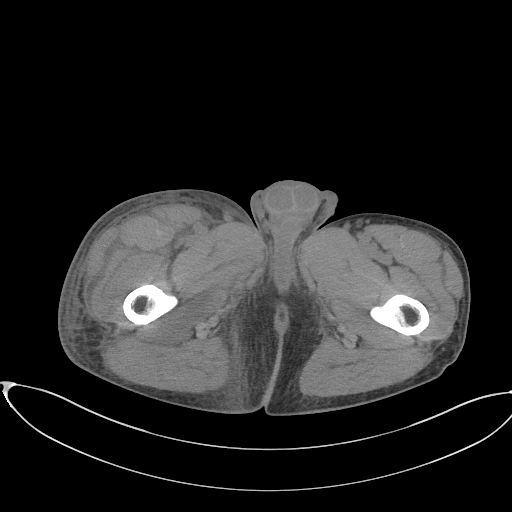
[im 4/96  bone]
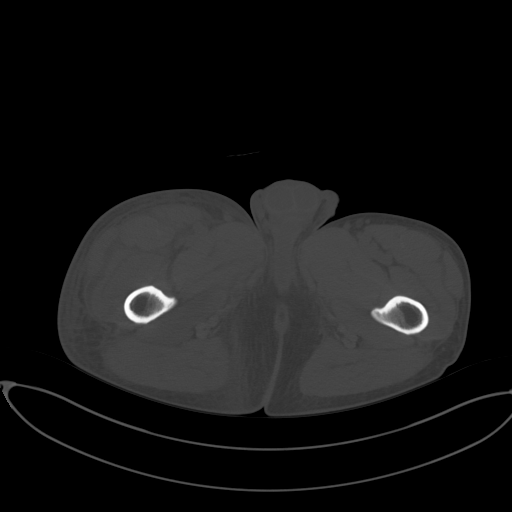
[im 11/96  soft-tissue]
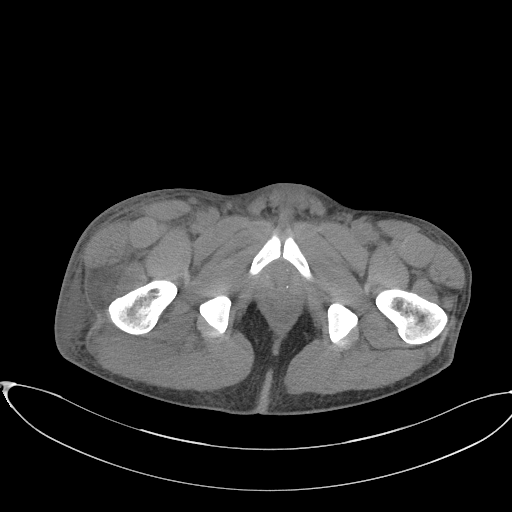
[im 19/96  soft-tissue]
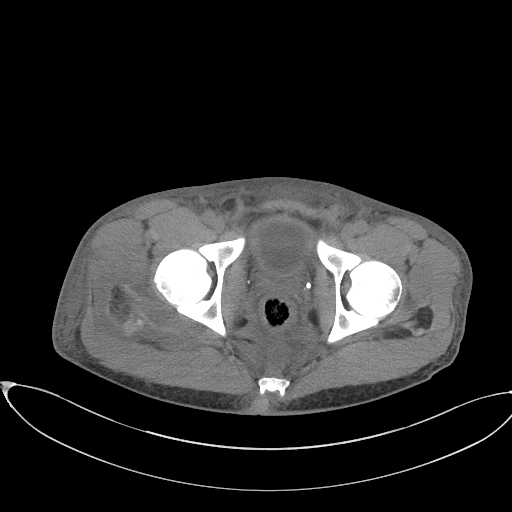
[im 26/96  soft-tissue]
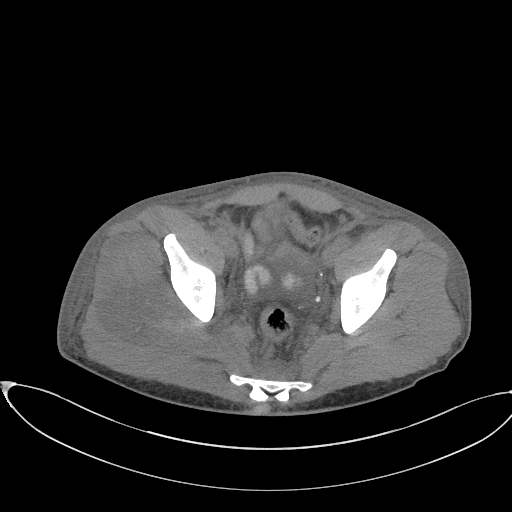
[im 33/96  soft-tissue]
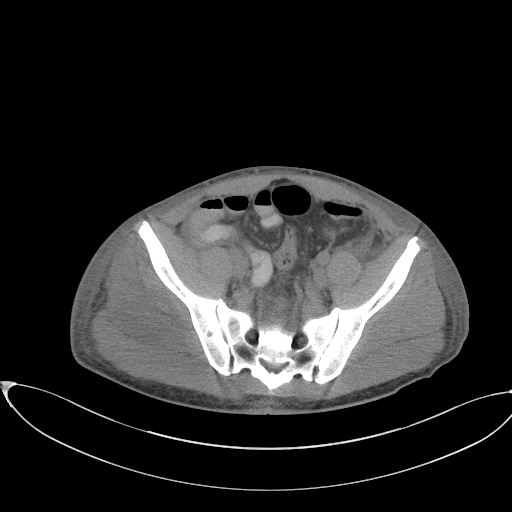
[im 41/96  soft-tissue]
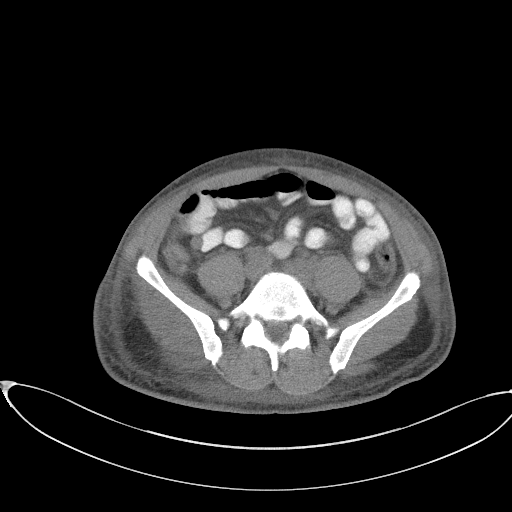
[im 48/96  soft-tissue]
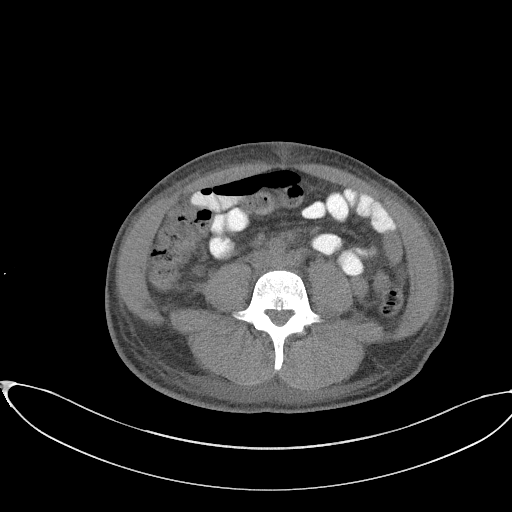
[im 55/96  soft-tissue]
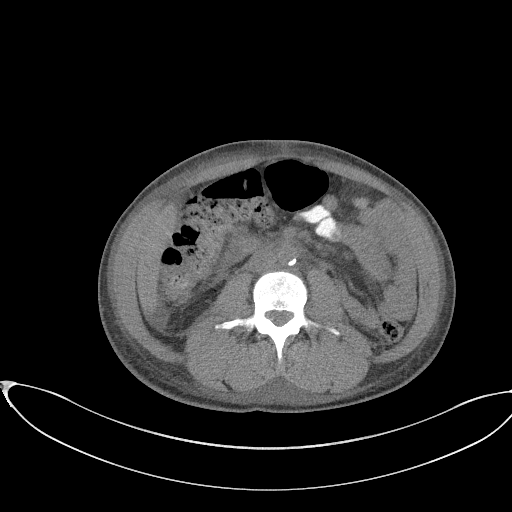
[im 63/96  soft-tissue]
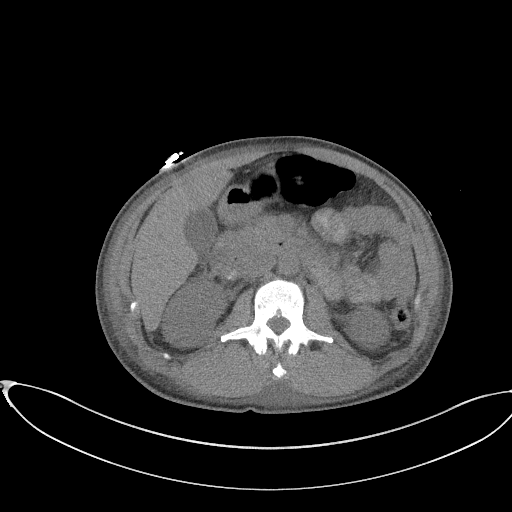
[im 63/96  bone]
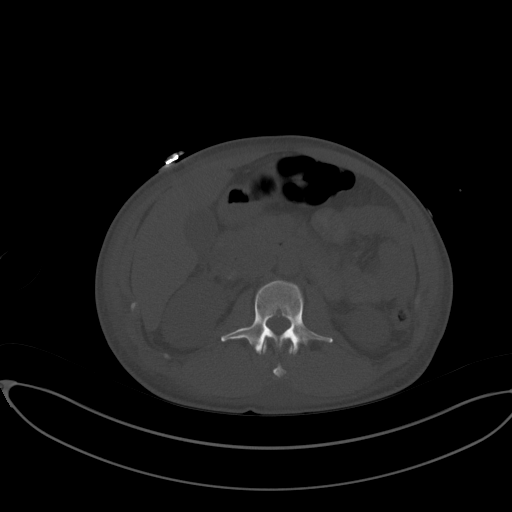
[im 70/96  soft-tissue]
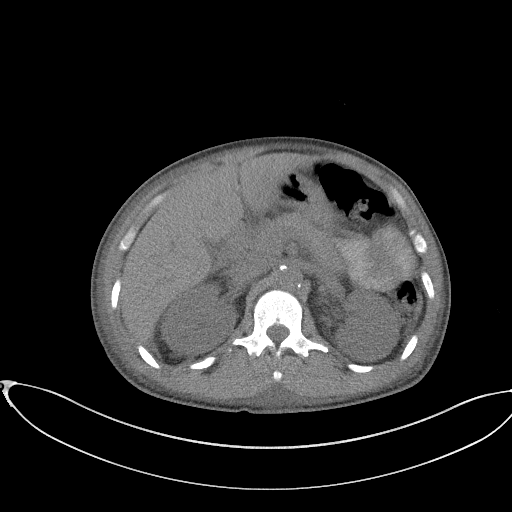
[im 77/96  soft-tissue]
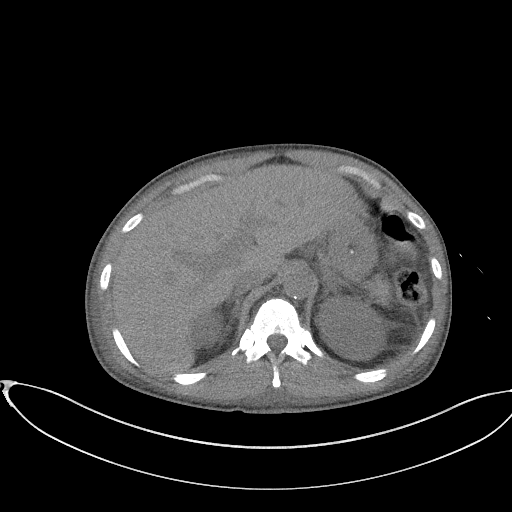
[im 85/96  soft-tissue]
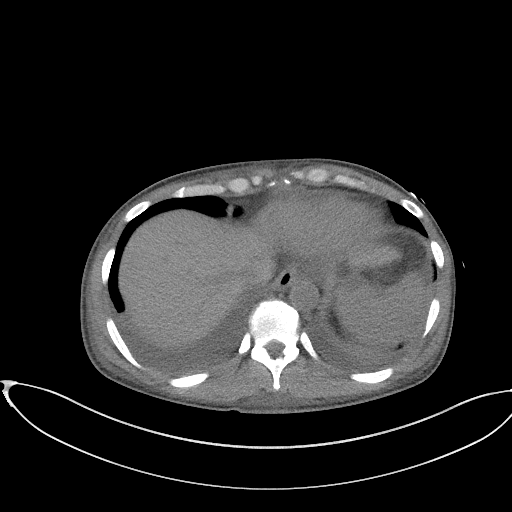
[im 92/96  soft-tissue]
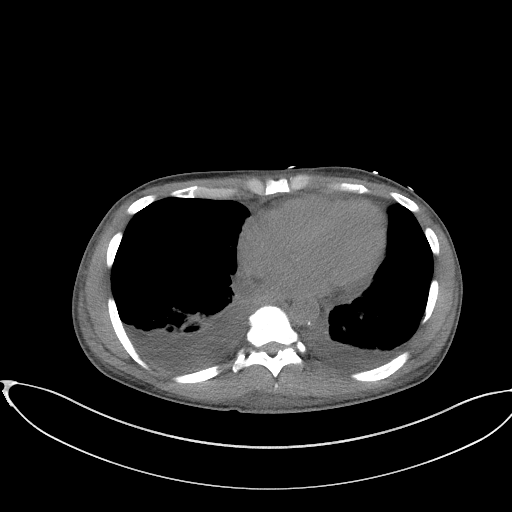

[Series 8: a/p w/o cor · coronal · non-contrast · 0.83mm/px · 3 of 146 slices shown]
[im 49/146  soft-tissue]
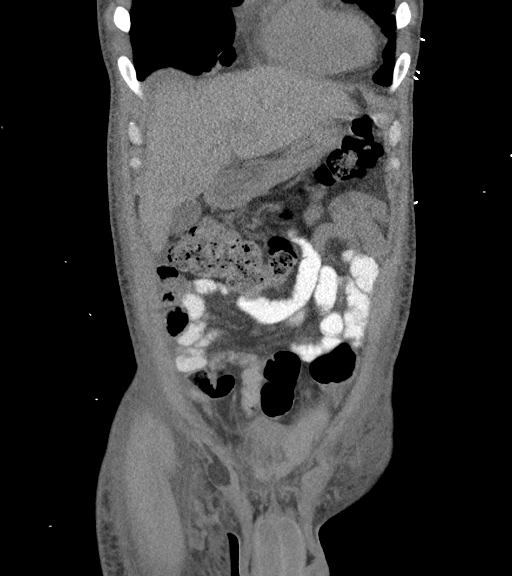
[im 65/146  soft-tissue]
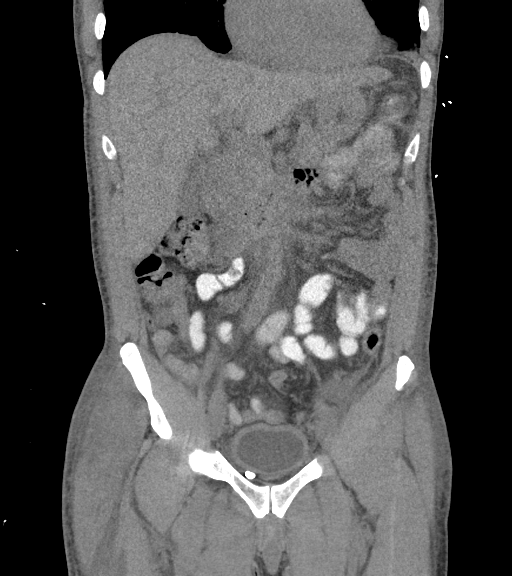
[im 81/146  soft-tissue]
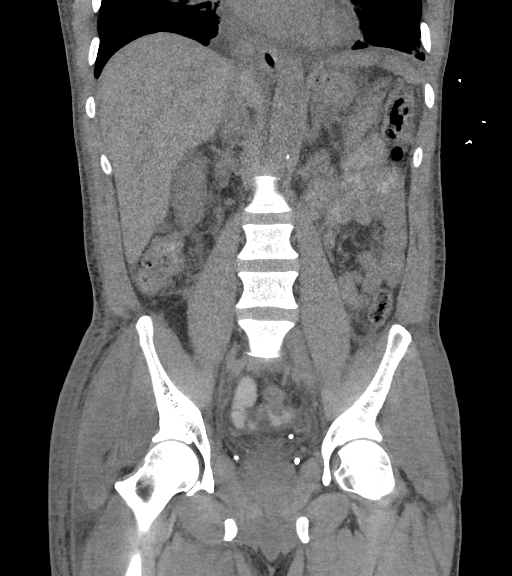

[16 of 46 positions shown; findings below may reference images not displayed]

FINDINGS: Lower chest: Moderate bilateral pleural effusions, new since prior
study. Bibasilar atelectasis.

Hepatobiliary: No focal hepatic abnormality. Gallbladder
unremarkable.

Pancreas: No focal abnormality or ductal dilatation.

Spleen: No focal abnormality.  Normal size.

Adrenals/Urinary Tract: Previously seen right perinephric stranding
again noted, slightly decreased. Fluid continues inferiorly in the
right retroperitoneum, also improved. No visible renal or adrenal
mass. No hydronephrosis. Urinary bladder unremarkable.

Stomach/Bowel: Stomach, large and small bowel grossly unremarkable.

Vascular/Lymphatic: Scattered aortic atherosclerosis. No evidence of
aneurysm or adenopathy.

Reproductive: No visible focal abnormality.

Other: Stranding noted in the perirectal space. No visible free
intraperitoneal fluid or air. Stranding noted throughout the
subcutaneous soft tissues diffusely suggesting anasarca.

Musculoskeletal: No acute bony abnormality.
IMPRESSION: Continued low-density throughout the right gluteal muscles, favor
intramuscular hematoma.

Continued right perinephric stranding and retroperitoneal stranding,
improving since prior study. This could reflect resolving
retroperitoneal hematoma.

No visible ascites or hemoperitoneum currently.

Anasarca like edema throughout the subcutaneous soft tissues.

## 2021-02-01 IMAGING — CT CT FEMUR *R* W/O CM
2 of 3 series · 11 of 33 positions shown, 13 images · non-contrast
Comparison: Right hip x-ray [DATE].  Right femur MRI [DATE]

CLINICAL DATA: Right leg pain and bruising. Rhabdomyolysis with
down-trending creatinine LAMBERTO levels

EXAM:
CT OF THE LOWER RIGHT EXTREMITY WITHOUT CONTRAST
TECHNIQUE: Multidetector CT imaging of the right lower extremity was performed
according to the standard protocol.

[Series 6: lower ext 1.5 st · axial · 0.50mm/px · z∈[-919,-304]mm · 8 of 486 slices shown, 10 images]
[im 38/486  soft-tissue]
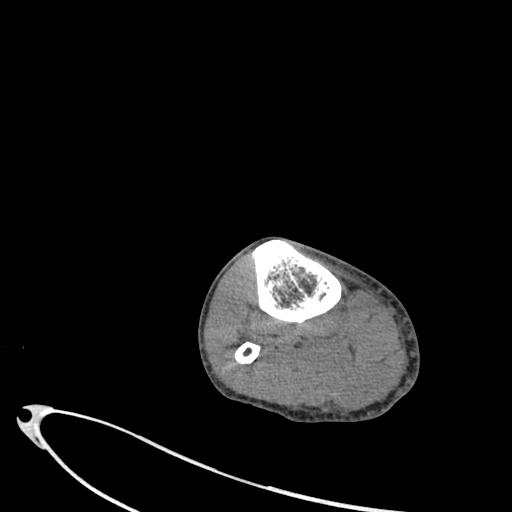
[im 38/486  bone]
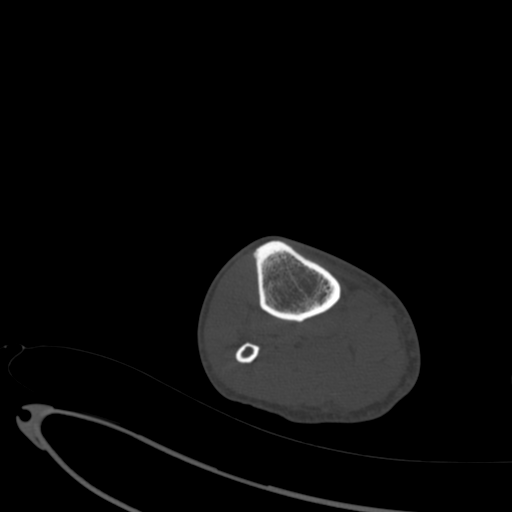
[im 112/486  bone]
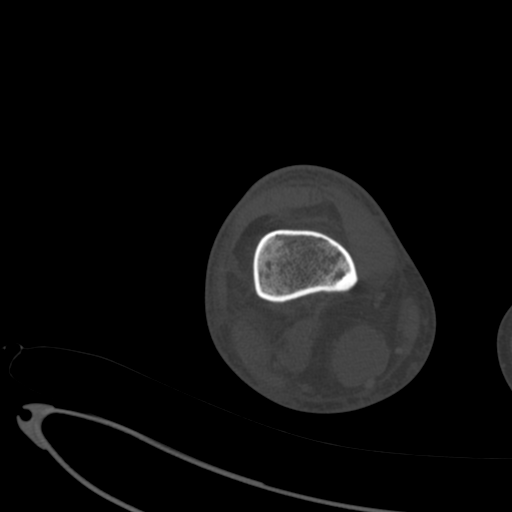
[im 150/486  bone]
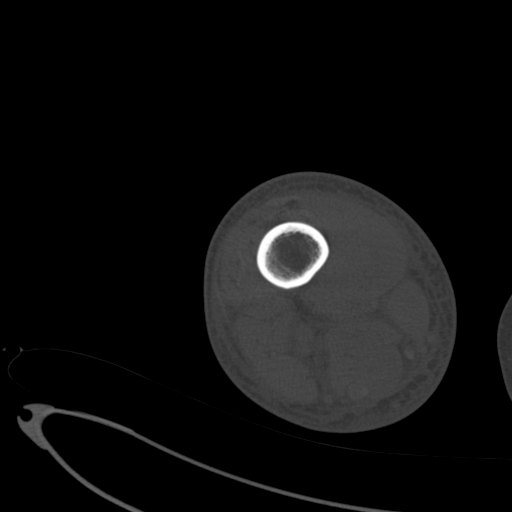
[im 224/486  bone]
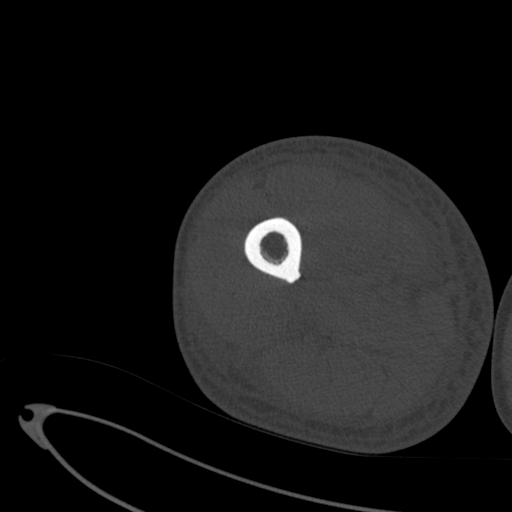
[im 262/486  soft-tissue]
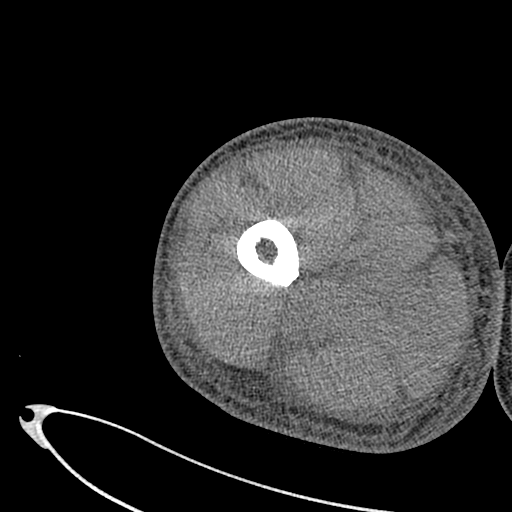
[im 262/486  bone]
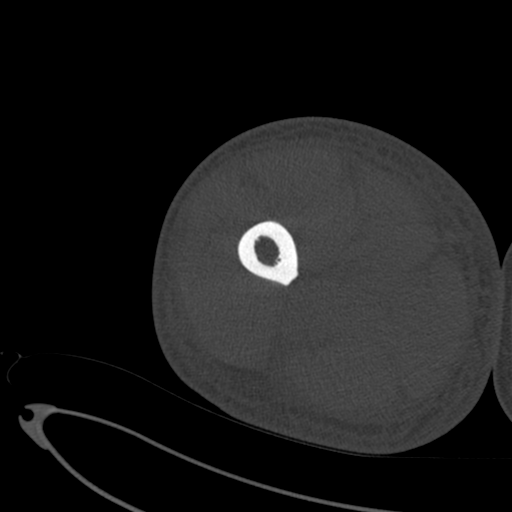
[im 336/486  bone]
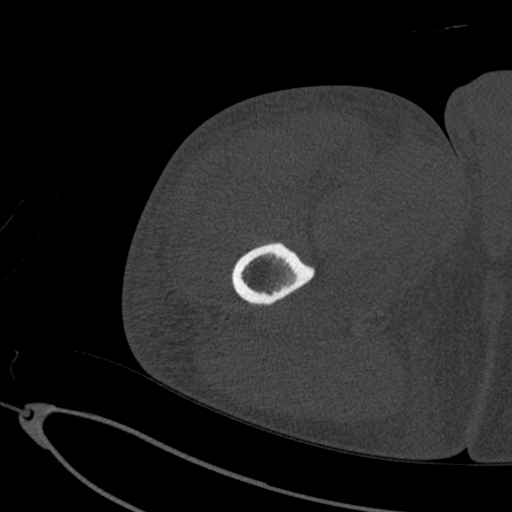
[im 374/486  bone]
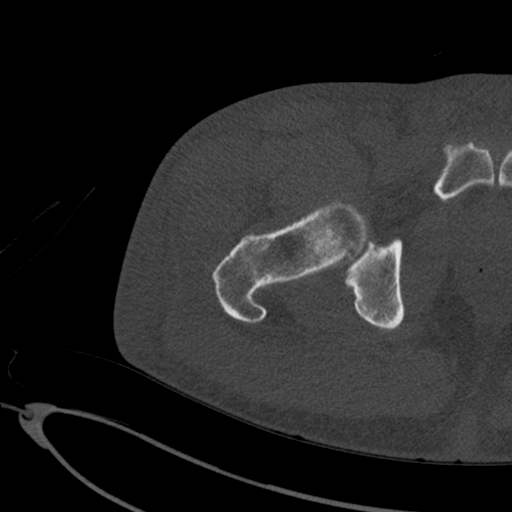
[im 448/486  bone]
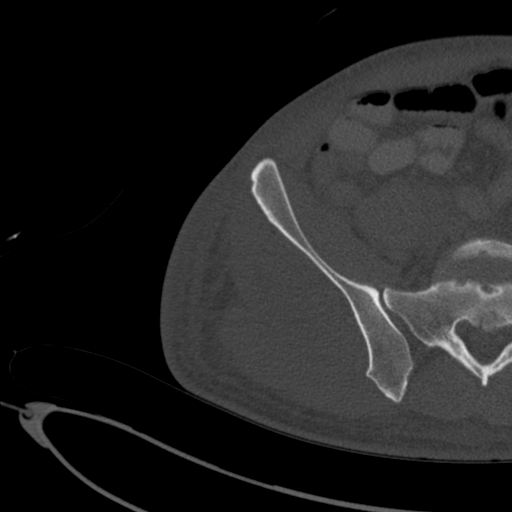

[Series 11: lower ext cor st · coronal · 0.49mm/px · 3 of 166 slices shown]
[im 34/166  bone]
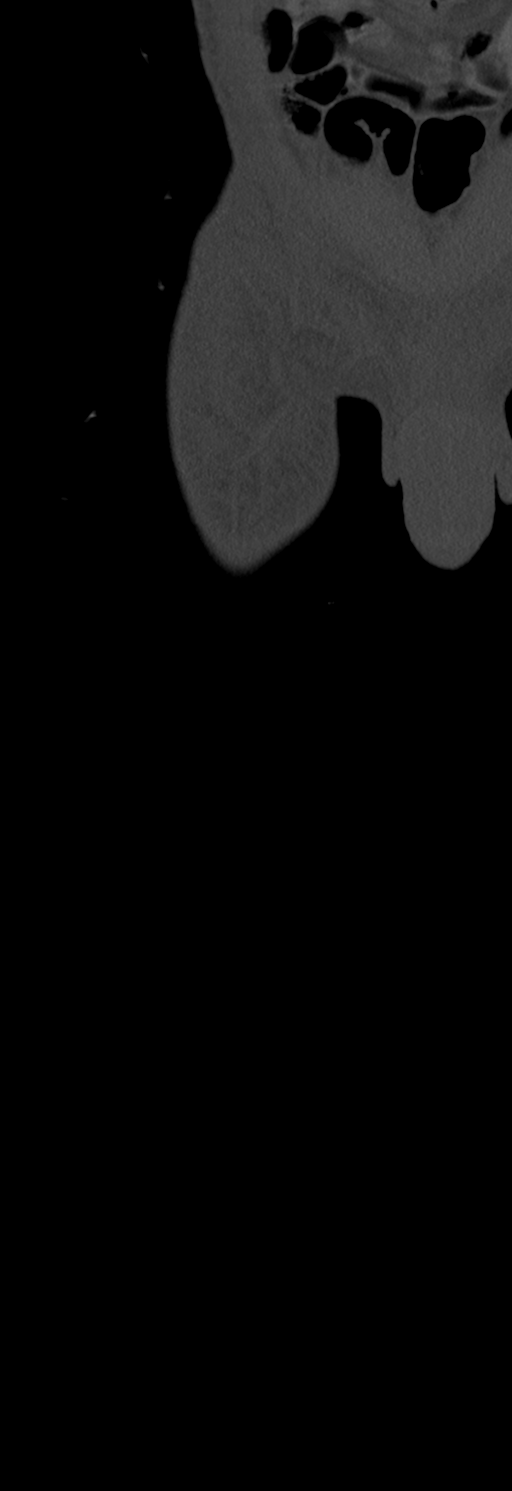
[im 67/166  bone]
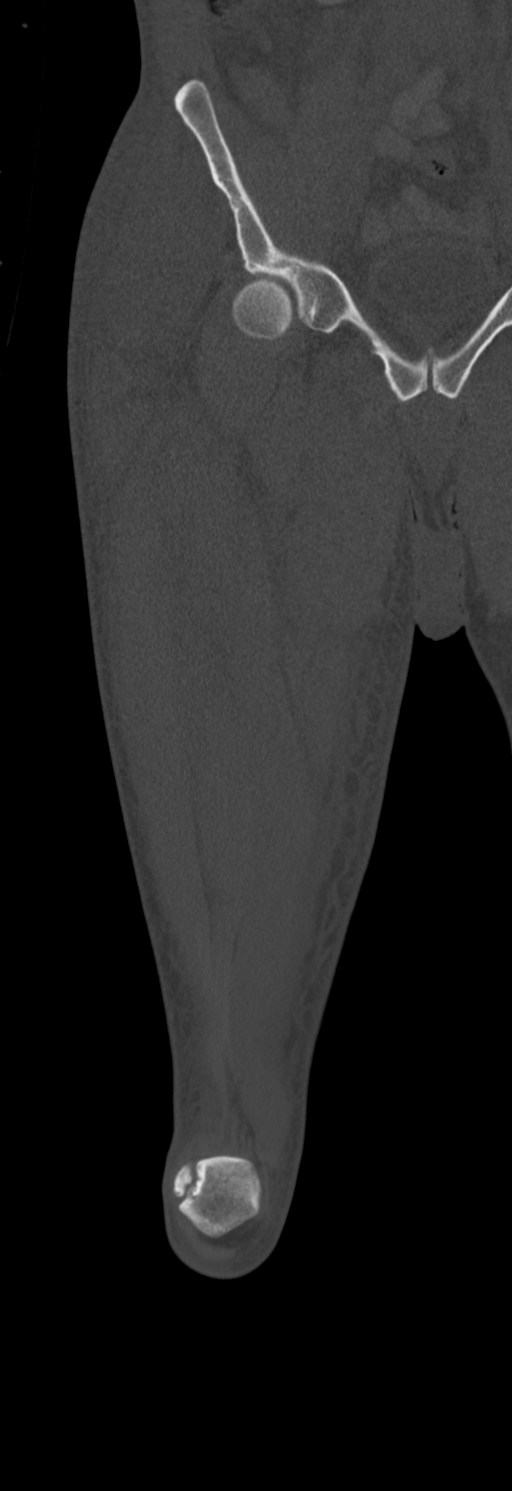
[im 100/166  bone]
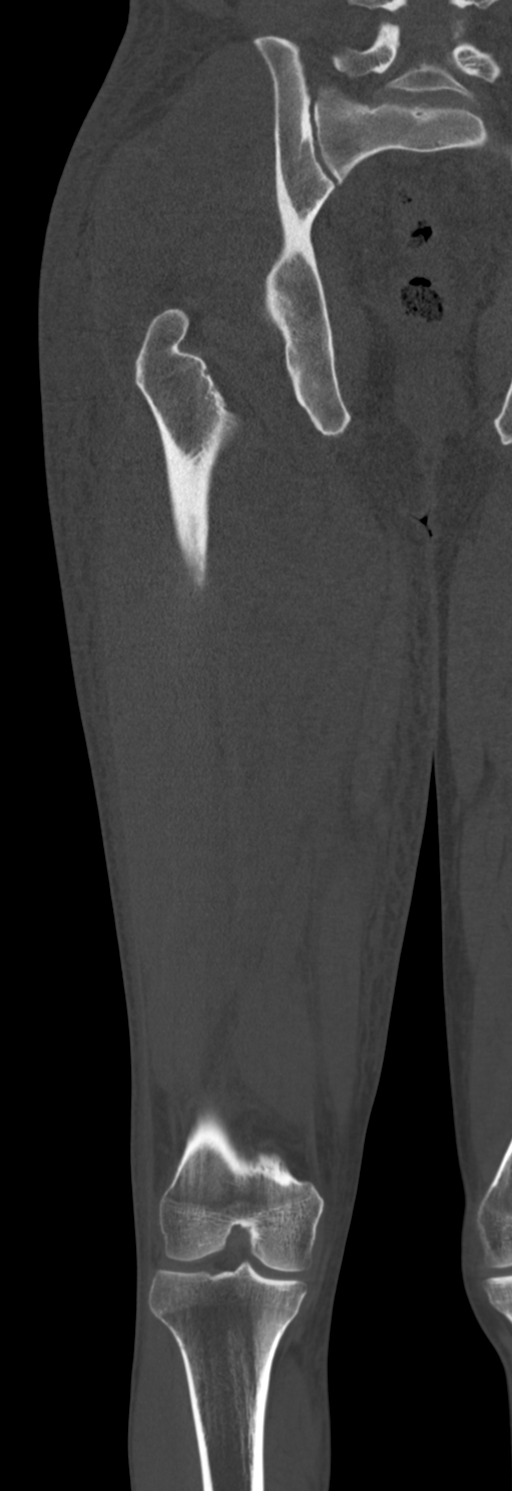

[11 of 33 positions shown; findings below may reference images not displayed]

FINDINGS: Bones/Joint/Cartilage

No acute fracture. No dislocation. Joint spaces of the right hip and
right knee are maintained. No appreciable hip or knee joint
effusion. The included portion of the right hemipelvis is intact. No
right SI joint or pubic symphysis diastasis. No suspicious lytic or
sclerotic bone lesion. No erosion. No periosteal elevation.

Ligaments

Suboptimally assessed by CT.

Muscles and Tendons

Relatively decreased attenuation of the right gluteus medius and
gluteus minimus muscles as well as the right quadratus femoris
muscle. No intramuscular fluid collection is evident by CT. No acute
tendinous abnormality is evident.

Soft tissues

Extensive circumferential subcutaneous edema throughout the right
thigh and visualized right pelvis. There is also edema and fluid
tracking within the fascial compartments of the right thigh. No
organized fluid collections. No soft tissue gas.
IMPRESSION: 1. Extensive circumferential subcutaneous edema throughout the right
thigh and visualized right pelvis with edema and fluid tracking
within the fascial compartments of the right thigh. No organized
fluid collections or soft tissue gas. Findings are nonspecific and
can be seen in the setting of cellulitis and myofasciitis.
2. Relatively decreased attenuation of the right gluteus medius and
gluteus minimus muscles as well as the right quadratus femoris
muscle. Findings likely related to patient's known rhabdomyolysis.
No evidence of myonecrosis by CT.
3. No acute osseous abnormality.

## 2021-02-01 MED ORDER — IOHEXOL 9 MG/ML PO SOLN
ORAL | Status: AC
  Filled 2021-02-01: qty 1000

## 2021-02-01 NOTE — Progress Notes (Signed)
Patient ID: Frank [name], male   DOB: 07/01/1970, 50 y.o.   MRN: [id number] [facility] Progress Note   Assessment/ Plan:   1. Acute kidney Injury: Renal function normal at baseline and now suspected to have acute kidney injury from rhabdomyolysis and trauma/hemorrhage of right kidney.  He is nonoliguric and possibly reaching the plateau phase of his renal injury without any obvious uremic signs or symptoms prompting need for dialysis.  We will continue to follow him closely with ongoing conservative management-no indication for additional fluids and will allow for optimization of oral intake. 2.  Rhabdomyolysis: Appears to be improving with downtrending CPK levels, plans noted to reconsult general surgery for additional management of increasing abdominal bruising with prior hemoperitoneum. 3.  Hyponatremia: Secondary to acute kidney injury and impaired free water handling, continue to monitor with supportive management at this time as he remains asymptomatic. 4.  Anemia: Likely exacerbated by retroperitoneal bleeding from trauma, no indication for PRBC transfusion. 5.  History of polysubstance abuse: Discussed risks and encourage cessation.  Subjective:   Reports continued pain over his abdomen, right thigh and apparently teeth on the right side of his mouth for which he had pain medications yesterday with good relief.   Objective:   BP (!) 160/100 (BP Location: Right Arm)   Pulse 95   Temp 98.7 F (37.1 C) (Oral)   Resp 20   Ht 5\' 8"  (1.727 m)   Wt 68.7 kg   SpO2 94%   BMI 23.02 kg/m   Intake/Output Summary (Last 24 hours) at 02/01/2021 0919 Last data filed at 02/01/2021 0500 Gross per 24 hour  Intake 410 ml  Output 1075 ml  Net -665 ml   Weight change: -0.318 kg  Physical Exam: Gen: Appears uncomfortable resting in bed, nurse at bedside CVS: Pulse regular rhythm, normal rate, S1 and S2 normal Resp: Poor inspiratory effort with decreased breath sounds over  bases, no rales/rhonchi Abd: Soft, tender over right upper and lower quadrants with skin bruising noted. Ext: No lower extremity edema noted  Imaging: No results found.  Labs: BMET Recent Labs  Lab 01/26/21 1600 01/27/21 1711 01/28/21 0804 01/29/21 0318 01/30/21 0307 01/31/21 0219 02/01/21 0410  NA 136 129* 132* 131* 130* 131* 134*  K 4.9 4.5 4.3 3.9 3.9 4.0 4.4  CL 96* 99 103 106 106 104 106  CO2 20* 18* 17* 15* 14* 15* 17*  GLUCOSE 136* 127* 90 97 93 122* 103*  BUN 39* 61* 67* 70* 67* 71* 73*  CREATININE 3.86* 5.60* 7.05* 8.29* 9.19* 10.17* 10.38*  CALCIUM 8.0* 6.4* 6.7* 7.2* 8.0* 8.5* 8.8*  PHOS  --   --   --  4.7* 4.7*  --  6.4*   CBC Recent Labs  Lab 01/28/21 0804 01/29/21 0318 01/31/21 0219 02/01/21 0410  WBC 12.9* 11.2* 10.8* 11.2*  NEUTROABS 10.6* 8.9* 7.6 7.6  HGB 10.7* 9.9* 9.6* 9.9*  HCT 29.5* 27.9* 27.1* 27.4*  MCV 94.9 96.5 95.8 93.8  PLT 198 180 246 283    Medications:     iohexol       folic acid  1 mg Oral Daily   multivitamin with minerals  1 tablet Oral Daily   sodium bicarbonate  1,300 mg Oral BID   [START ON 02/04/2021] thiamine  100 mg Oral Daily   02/06/2021, MD 02/01/2021, 9:19 AM

## 2021-02-01 NOTE — Progress Notes (Signed)
PROGRESS NOTE    MD SMOLA  EPP:[id number] DOB: 04/27/1970 DOA: 01/26/2021 PCP: Pcp, No    No chief complaint on file.   Brief Narrative:  50 yo AA male with hx of alcohol abuse, cocaine abuse, and THC use who presents after Anaaya Fuster fall.  Reportedly smelled of etoh and was slurring speech with EMS.  Initially hypotenesive, but this has improved.    Imaging without acute findings.  He's been admitted with acute kidney injury and elevated LFT's in the setting of alcohol abuse.  He's complaining of right sided numbness and imaging is currently pending.   Assessment & Plan:   Principal Problem:   AKI (acute kidney injury) (HCC) Active Problems:   Elevated LFTs   Alcohol abuse   Polysubstance abuse (HCC)   Diarrhea  Hemoperitoneum CT abd/pelvis concerning for trauma - perinephric stranding around R kidney with small volume hemorrhage within retroperitoneum, asymmetric, hypodense, and heterogeneous R gluteal musculature Appreciate surgery eval and assistance - now signed off  Worsened bruising today, right flank into right thigh -> stop heparin - repeat CT - will discuss again with surgery   Rhabdomyolysis  Acute Kidney Injury Related to cocaine use/trauma  CK improving IVF per renal  UOP improved [place] without hydro  UA with protein, 6-10 RBC Urine myoglobin pending Creatinine continuing to rise today, though slowing, may be reaching Jessica Checketts plateau - appreciate renal assistance    Right Lower Extremity Numbness  Right Sided Hip and Pelvic Musculature Trauma  Intramuscular and Interfascial Hemorrhage, edema, and fluid  Mechanical Fall Appreciate orthopedic eval, no evidence of impending compartment syndrome - > symptoms thought related to 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 No motor deficits on exam, per ortho, sensation likely to return with time MRI brain without acute intracranial process  Anemia Hb relatively stable over past few days,  downtrended overall from admission  Alcohol Abuse  CIWA protocol Encourage cessation  Elevated LFT's 2/2 etoh abuse and rhabdo, trend - improving RUQ [place] notable for sludge hep C ab negatiev (says pending, unsure why, comment notes negative) negative HIV, hep Cleva Camero, B  Diarrhea Follow GI panel - negative HIV, acute hepatitis panel as above  Polysubstance abuse Encourage cessation  DVT prophylaxis: SCD Code Status: full Family Communication: none at bedside Disposition:   Status is: inpatient  The patient will require care spanning > 2 midnights and should be moved to inpatient because: Inpatient level of care appropriate due to severity of illness  Dispo: The patient is from: Home              Anticipated d/c is to:  pending              Patient currently is not medically stable to d/c.   Difficult to place patient No       Consultants:  none  Procedures:  none  Antimicrobials:  Anti-infectives (From admission, onward)    None          Subjective: Bruising noted today to leg  Objective: Vitals:   01/31/21 0343 01/31/21 1324 01/31/21 2054 02/01/21 0447  BP: (!) 153/88 (!) 153/99 (!) 173/97 (!) 160/100  Pulse: 80 81 87 95  Resp: 18 18 20 20   Temp: 98.6 F (37 C) 98.6 F (37 C) 97.7 F (36.5 C) 98.7 F (37.1 C)  TempSrc: Oral Oral Oral Oral  SpO2: 100% 96% 98% 94%  Weight:    68.7 kg  Height:  Intake/Output Summary (Last 24 hours) at 02/01/2021 0900 Last data filed at 02/01/2021 0500 Gross per 24 hour  Intake 410 ml  Output 1075 ml  Net -665 ml   Filed Weights   01/30/21 0356 01/31/21 0116 02/01/21 0447  Weight: 67.4 kg 69 kg 68.7 kg    Examination: General: No acute distress. Cardiovascular: RRR Lungs: unlabored Abdomen: grey turners sign, soft Neurological: Alert and oriented 3. Moves all extremities 4. Cranial nerves II through XII grossly intact. Extremities: bruising and swelling into right leg - intact motor    Data  Reviewed: I have personally reviewed following labs and imaging studies  CBC: Recent Labs  Lab 01/27/21 1711 01/28/21 0804 01/29/21 0318 01/31/21 0219 02/01/21 0410  WBC 14.4* 12.9* 11.2* 10.8* 11.2*  NEUTROABS 12.2* 10.6* 8.9* 7.6 7.6  HGB 11.8* 10.7* 9.9* 9.6* 9.9*  HCT 33.4* 29.5* 27.9* 27.1* 27.4*  MCV 97.4 94.9 96.5 95.8 93.8  PLT 210 198 180 246 283    Basic Metabolic Panel: Recent Labs  Lab 01/27/21 1711 01/28/21 0804 01/29/21 0318 01/30/21 0307 01/31/21 0219 02/01/21 0410  NA 129* 132* 131* 130* 131* 134*  K 4.5 4.3 3.9 3.9 4.0 4.4  CL 99 103 106 106 104 106  CO2 18* 17* 15* 14* 15* 17*  GLUCOSE 127* 90 97 93 122* 103*  BUN 61* 67* 70* 67* 71* 73*  CREATININE 5.60* 7.05* 8.29* 9.19* 10.17* 10.38*  CALCIUM 6.4* 6.7* 7.2* 8.0* 8.5* 8.8*  MG 2.0  --  1.8 1.8  --  1.9  PHOS  --   --  4.7* 4.7*  --  6.4*    GFR: Estimated Creatinine Clearance: 8.1 mL/min (Benoit Meech) (by C-G formula based on SCr of 10.38 mg/dL (H)).  Liver Function Tests: Recent Labs  Lab 01/28/21 0804 01/29/21 0318 01/30/21 0307 01/31/21 0219 02/01/21 0410  AST 899* 813* 566* 378* 247*  ALT 356* 348* 313* 266* 238*  ALKPHOS 55 50 50 49 50  BILITOT 0.8 0.5 1.0 0.8 0.9  PROT 5.1* 4.9* 4.9* 4.8* 5.1*  ALBUMIN 2.6* 2.5* 2.3*  2.3* 2.2* 2.4*    CBG: No results for input(s): GLUCAP in the last 168 hours.   Recent Results (from the past 240 hour(s))  Resp Panel by RT-PCR (Flu Dyrell Tuccillo&B, Covid) Nasopharyngeal Swab     Status: None   Collection Time: 01/26/21  4:06 PM   Specimen: Nasopharyngeal Swab; Nasopharyngeal(NP) swabs in vial transport medium  Result Value Ref Range Status   SARS Coronavirus 2 by RT PCR NEGATIVE NEGATIVE Final    Comment: (NOTE) SARS-CoV-2 target nucleic acids are NOT DETECTED.  The SARS-CoV-2 RNA is generally detectable in upper respiratory specimens during the acute phase of infection. The lowest concentration of SARS-CoV-2 viral copies this assay can detect is 138  copies/mL. Camesha Farooq negative result does not preclude SARS-Cov-2 infection and should not be used as the sole basis for treatment or other patient management decisions. Edris Friedt negative result may occur with  improper specimen collection/handling, submission of specimen other than nasopharyngeal swab, presence of viral mutation(s) within the areas targeted by this assay, and inadequate number of viral copies(<138 copies/mL). [place] negative result must be combined with clinical observations, patient history, and epidemiological information. The expected result is Negative.  Fact Sheet for Patients:  BloggerCourse.com  Fact Sheet for Healthcare Providers:  SeriousBroker.it  This test is no t yet approved or cleared by the [place] FDA and  has been authorized for detection and/or diagnosis of SARS-CoV-2 by FDA  under an Emergency Use Authorization (EUA). This EUA will remain  in effect (meaning this test can be used) for the duration of the COVID-19 declaration under Section 564(b)(1) of the Act, 21 U.S.C.section 360bbb-3(b)(1), unless the authorization is terminated  or revoked sooner.       Influenza Concettina Leth by PCR NEGATIVE NEGATIVE Final   Influenza B by PCR NEGATIVE NEGATIVE Final    Comment: (NOTE) The Xpert Xpress SARS-CoV-2/FLU/RSV plus assay is intended as an aid in the diagnosis of influenza from Nasopharyngeal swab specimens and should not be used as Chrys Landgrebe sole basis for treatment. Nasal washings and aspirates are unacceptable for Xpert Xpress SARS-CoV-2/FLU/RSV testing.  Fact Sheet for Patients: BloggerCourse.com  Fact Sheet for Healthcare Providers: SeriousBroker.it  This test is not yet approved or cleared by the [place] FDA and has been authorized for detection and/or diagnosis of SARS-CoV-2 by FDA under an Emergency Use Authorization (EUA). This EUA will remain in effect (meaning  this test can be used) for the duration of the COVID-19 declaration under Section 564(b)(1) of the Act, 21 U.S.C. section 360bbb-3(b)(1), unless the authorization is terminated or revoked.  Performed at [facility], [address], [place], [place] [address]   Urine Culture     Status: None   Collection Time: 01/27/21  7:33 PM   Specimen: Urine, Clean Catch  Result Value Ref Range Status   Specimen Description URINE, CLEAN CATCH  Final   Special Requests NONE  Final   Culture   Final    NO GROWTH Performed at [facility], [address], [place], [place] [address]    Report Status 01/29/2021 FINAL  Final  Gastrointestinal Panel by PCR , Stool     Status: None   Collection Time: 01/28/21  3:46 PM   Specimen: Stool  Result Value Ref Range Status   Campylobacter species NOT DETECTED NOT DETECTED Final   Plesimonas shigelloides NOT DETECTED NOT DETECTED Final   Salmonella species NOT DETECTED NOT DETECTED Final   Yersinia enterocolitica NOT DETECTED NOT DETECTED Final   Vibrio species NOT DETECTED NOT DETECTED Final   Vibrio cholerae NOT DETECTED NOT DETECTED Final   Enteroaggregative E coli (EAEC) NOT DETECTED NOT DETECTED Final   Enteropathogenic E coli (EPEC) NOT DETECTED NOT DETECTED Final   Enterotoxigenic E coli (ETEC) NOT DETECTED NOT DETECTED Final   Shiga like toxin producing E coli (STEC) NOT DETECTED NOT DETECTED Final   Shigella/Enteroinvasive E coli (EIEC) NOT DETECTED NOT DETECTED Final   Cryptosporidium NOT DETECTED NOT DETECTED Final   Cyclospora cayetanensis NOT DETECTED NOT DETECTED Final   Entamoeba histolytica NOT DETECTED NOT DETECTED Final   Giardia lamblia NOT DETECTED NOT DETECTED Final   Adenovirus F40/41 NOT DETECTED NOT DETECTED Final   Astrovirus NOT DETECTED NOT DETECTED Final   Norovirus GI/GII NOT DETECTED NOT DETECTED Final   Rotavirus Meeyah Ovitt NOT DETECTED NOT DETECTED Final   Sapovirus (I, II, IV, and V) NOT DETECTED NOT DETECTED Final     Comment: Performed at [facility], [address], [place], [place] [address]         Radiology Studies: No results found.      Scheduled Meds:  folic acid  1 mg Oral Daily   multivitamin with minerals  1 tablet Oral Daily   sodium bicarbonate  1,300 mg Oral BID   [START ON 02/04/2021] thiamine  100 mg Oral Daily   Continuous Infusions:  thiamine injection Stopped (01/31/21 1013)  LOS: 5 days    Time spent: over 30 min    Lacretia Nicks, MD [facility]   To contact the attending provider between 7A-7P or the covering provider during after hours 7P-7A, please log into the web site [email or web address] and access using universal [place] password for that web site. If you do not have the password, please call the hospital operator.  02/01/2021, 9:00 AM

## 2021-02-01 NOTE — Progress Notes (Addendum)
Patient ID: Frank [name], male   DOB: 07/06/1970, 50 y.o.   MRN: [id number]     Subjective: Asked to re-evaluate pt due to increased swelling RLE. Pt denies changes in pain. Denies numbness/tingling. Voiding independently.   ROS negative except as listed above. Objective: Vital signs in last 24 hours: Temp:  [97.7 F (36.5 C)-98.7 F (37.1 C)] 98.4 F (36.9 C) (10/10 0932) Pulse Rate:  [81-95] 93 (10/10 0932) Resp:  [18-20] 20 (10/10 0447) BP: (153-193)/(94-100) 193/94 (10/10 0932) SpO2:  [94 %-100 %] 100 % (10/10 0932) Weight:  [68.7 kg] 68.7 kg (10/10 0447) Last BM Date: 01/28/21  Intake/Output from previous day: 10/09 0701 - 10/10 0700 In: 650 [P.O.:600; IV Piggyback:50] Out: 1325 [Urine:1325] Intake/Output this shift: Total I/O In: 240 [P.O.:240] Out: 275 [Urine:275]  General appearance: cooperative Resp: clear to auscultation bilaterally Cardio: regular rate and rhythm GI: soft, ecchymosis R flank/lower back, minimal r sided tenderness without guarding, no peritonitis MSK: hematoma of R hip that extends to R thigh. Thigh is soft and mildly tender. Femoral, popliteal, DP, and PT pulses are 2+ RLE.   Lab Results: CBC  Recent Labs    01/31/21 0219 02/01/21 0410  WBC 10.8* 11.2*  HGB 9.6* 9.9*  HCT 27.1* 27.4*  PLT 246 283   BMET Recent Labs    01/31/21 0219 02/01/21 0410  NA 131* 134*  K 4.0 4.4  CL 104 106  CO2 15* 17*  GLUCOSE 122* 103*  BUN 71* 73*  CREATININE 10.17* 10.38*  CALCIUM 8.5* 8.8*   PT/INR No results for input(s): LABPROT, INR in the last 72 hours.  ABG No results for input(s): PHART, HCO3 in the last 72 hours.  Invalid input(s): PCO2, PO2  Studies/Results:   Anti-infectives: Anti-infectives (From admission, onward)    None       Assessment/Plan: Status post fall onto right side, likely down for a period of time Buttock and hip muscular injury and rhabdomyolysis causing acute kidney injury - Ortho consult noted,  primary team treating AKI/rhabdo; exam consistent with R hip hematoma, no signs of vascular compromise. Hemodynamically stable. CT w/ what appears to be extraperitoneal hematoma - await final read. Hold blood thinners. Apply ice. Would typically recommend compression with abdominal binder but given location of hematoma I don't think this would be useful. PT/OT. No urgent surgical needs. This should resolve on its own with non-operative measures.  Right perinephric retroperitoneal hematoma with left perinephric stranding - retroperitoneal contusion.  No large renal injury seen on noncontrast study.  This should resolve without any further treatment.   Diarrhea - likely enteritis, bowel looked normal on non-con CT, stool studies pending, WBC down to 11.2, no acute surgical issue ETOH and cocaine abuse  Trauma will sign off.    LOS: 5 days    Hosie Spangle, PA-C  Trauma & General Surgery Use [email or web address] to contact on call provider  02/01/2021

## 2021-02-02 LAB — CBC WITH DIFFERENTIAL/PLATELET
Abs Immature Granulocytes: 0.16 10*3/uL — ABNORMAL HIGH (ref 0.00–0.07)
Basophils Absolute: 0 10*3/uL (ref 0.0–0.1)
Basophils Relative: 0 %
Eosinophils Absolute: 0.2 10*3/uL (ref 0.0–0.5)
Eosinophils Relative: 1 %
HCT: 27.4 % — ABNORMAL LOW (ref 39.0–52.0)
Hemoglobin: 9.9 g/dL — ABNORMAL LOW (ref 13.0–17.0)
Immature Granulocytes: 1 %
Lymphocytes Relative: 11 %
Lymphs Abs: 1.9 10*3/uL (ref 0.7–4.0)
MCH: 34.1 pg — ABNORMAL HIGH (ref 26.0–34.0)
MCHC: 36.1 g/dL — ABNORMAL HIGH (ref 30.0–36.0)
MCV: 94.5 fL (ref 80.0–100.0)
Monocytes Absolute: 2.1 10*3/uL — ABNORMAL HIGH (ref 0.1–1.0)
Monocytes Relative: 12 %
Neutro Abs: 12.3 10*3/uL — ABNORMAL HIGH (ref 1.7–7.7)
Neutrophils Relative %: 75 %
Platelets: 333 10*3/uL (ref 150–400)
RBC: 2.9 MIL/uL — ABNORMAL LOW (ref 4.22–5.81)
RDW: 12.6 % (ref 11.5–15.5)
WBC: 16.7 10*3/uL — ABNORMAL HIGH (ref 4.0–10.5)
nRBC: 0 % (ref 0.0–0.2)

## 2021-02-02 LAB — COMPREHENSIVE METABOLIC PANEL
ALT: 190 U/L — ABNORMAL HIGH (ref 0–44)
AST: 147 U/L — ABNORMAL HIGH (ref 15–41)
Albumin: 2.4 g/dL — ABNORMAL LOW (ref 3.5–5.0)
Alkaline Phosphatase: 49 U/L (ref 38–126)
Anion gap: 8 (ref 5–15)
BUN: 73 mg/dL — ABNORMAL HIGH (ref 6–20)
CO2: 21 mmol/L — ABNORMAL LOW (ref 22–32)
Calcium: 8.7 mg/dL — ABNORMAL LOW (ref 8.9–10.3)
Chloride: 104 mmol/L (ref 98–111)
Creatinine, Ser: 9.88 mg/dL — ABNORMAL HIGH (ref 0.61–1.24)
GFR, Estimated: 6 mL/min — ABNORMAL LOW (ref >60)
Glucose, Bld: 111 mg/dL — ABNORMAL HIGH (ref 70–99)
Potassium: 4.7 mmol/L (ref 3.5–5.1)
Sodium: 133 mmol/L — ABNORMAL LOW (ref 135–145)
Total Bilirubin: 0.6 mg/dL (ref 0.3–1.2)
Total Protein: 5.2 g/dL — ABNORMAL LOW (ref 6.5–8.1)

## 2021-02-02 LAB — HEPATITIS PANEL, ACUTE
Hep A IgM: NONREACTIVE
Hep B C IgM: NONREACTIVE
Hepatitis B Surface Ag: NONREACTIVE

## 2021-02-02 LAB — MAGNESIUM: Magnesium: 1.8 mg/dL (ref 1.7–2.4)

## 2021-02-02 LAB — PHOSPHORUS: Phosphorus: 5.3 mg/dL — ABNORMAL HIGH (ref 2.5–4.6)

## 2021-02-02 LAB — CK: Total CK: 4443 U/L — ABNORMAL HIGH (ref 49–397)

## 2021-02-02 MED ORDER — CEFAZOLIN SODIUM-DEXTROSE 1-4 GM/50ML-% IV SOLN
1.0000 g | INTRAVENOUS | Status: DC
Start: 2021-02-02 — End: 2021-02-04
  Administered 2021-02-02 – 2021-02-03 (×2): 1 g via INTRAVENOUS
  Filled 2021-02-02 (×4): qty 50

## 2021-02-02 MED ORDER — AMLODIPINE BESYLATE 5 MG PO TABS
5.0000 mg | ORAL_TABLET | Freq: Every day | ORAL | Status: DC
  Administered 2021-02-02 – 2021-02-06 (×5): 5 mg via ORAL
  Filled 2021-02-02 (×5): qty 1

## 2021-02-02 MED ORDER — POLYETHYLENE GLYCOL 3350 17 G PO PACK
17.0000 g | PACK | Freq: Two times a day (BID) | ORAL | Status: DC
  Administered 2021-02-02 – 2021-02-05 (×6): 17 g via ORAL
  Filled 2021-02-02 (×8): qty 1

## 2021-02-02 NOTE — Progress Notes (Signed)
PHARMACY NOTE:  ANTIMICROBIAL RENAL DOSAGE ADJUSTMENT  Current antimicrobial regimen includes a mismatch between antimicrobial dosage and estimated renal function.  As per policy approved by the Pharmacy & Therapeutics and Medical Executive Committees, the antimicrobial dosage will be adjusted accordingly.  Current antimicrobial dosage:  Cefazolin 1gm IV q8h  Indication: cellulitis  Renal Function:  Estimated Creatinine Clearance: 8.5 mL/min (A) (by C-G formula based on SCr of 9.88 mg/dL (H)). []      On intermittent HD, scheduled: []      On CRRT    Antimicrobial dosage has been changed to:  Cefazolin 1gm IV q24h  Additional comments:   Frank [name] A. , PharmD, BCPS, FNKF Clinical Pharmacist [place] Please utilize Amion for appropriate phone number to reach the unit pharmacist [facility])  02/02/2021 11:27 AM

## 2021-02-02 NOTE — Progress Notes (Signed)
PROGRESS NOTE    Frank [name]  XMD:[id number] DOB: Aug 14, 1970 DOA: 01/26/2021 PCP: Pcp, No    No chief complaint on file.   Brief Narrative:  50 yo AA male with hx of alcohol abuse, cocaine abuse, and THC use who presents after Frank [name] fall.  Reportedly smelled of etoh and was slurring speech with EMS.  Initially hypotenesive, but this has improved.    He was admitted after Frank [name] fall with AKI and rhabdomyolysis.  He c/o RLE numbness.  Imaging showed trauma to the right sided pelvic and hip musculature with extensive muscle tears and intramuscular and interfascial hemorrhage, edema and fluid.  Orthopedics saw him and noted no evidence of impending compartment syndrome, they suspect sensation should return with time.  General surgery was consulted in the setting of hemoperitoneum, they've now signed off.  Renal following with aki and rhabdomyolysis.   Assessment & Plan:   Principal Problem:   AKI (acute kidney injury) (HCC) Active Problems:   Elevated LFTs   Alcohol abuse   Polysubstance abuse (HCC)   Diarrhea  Hemoperitoneum CT abd/pelvis concerning for trauma - perinephric stranding around R kidney with small volume hemorrhage within retroperitoneum, asymmetric, hypodense, and heterogeneous R gluteal musculature Appreciate surgery eval and assistance - now signed off  Worsened bruising 10/10, right flank into right thigh -> stop heparin - repeat CT -> low density throughout the right gluteal muscles, favor intramuscular hematoma, continued R perineprhic stranding and retroperitoneal stranding, improved since prior study - anasarca  10/10, appreciate surgery reeval - right upper leg hematoma, will take weeks to months to resolve, follow   Rhabdomyolysis  Acute Kidney Injury Related to cocaine use/trauma  CK improving IVF per renal  UOP improved [place] without hydro  UA with protein, 6-10 RBC Urine myoglobin pending Creatinine improved today - appreciate renal assistance    Right Lower  Extremity Cellulitis Elevated WBC today, worsened erythema, start ancef Afebrile CT r femur 10/10 with extensive circumferential subcutaneous edema throughout right thigh and visualized pelvis with edema and fluid tracking within fascial compartments of R thigh  Right Lower Extremity Numbness  Right Sided Hip and Pelvic Musculature Trauma  Intramuscular and Interfascial Hemorrhage, edema, and fluid  Mechanical Fall Appreciate orthopedic eval, no evidence of impending compartment syndrome - > symptoms thought related to 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 No motor deficits on exam, per ortho, sensation likely to return with time MRI brain without acute intracranial process  Hypertension Start amlodipine Follow   Anemia Hb relatively stable over past few days, downtrended overall from admission  Alcohol Abuse  CIWA protocol Encourage cessation  Elevated LFT's 2/2 etoh abuse and rhabdo, trend - improving RUQ [place] notable for sludge hep C ab negatiev (says pending, unsure why, comment notes negative) negative HIV, hep Frank [name], B  Diarrhea Follow GI panel - negative HIV, acute hepatitis panel as above  Polysubstance abuse Encourage cessation  DVT prophylaxis: SCD Code Status: full Family Communication: none at bedside Disposition:   Status is: inpatient  The patient will require care spanning > 2 midnights and should be moved to inpatient because: Inpatient level of care appropriate due to severity of illness  Dispo: The patient is from: Home              Anticipated d/c is to:  pending              Patient currently is not medically stable to d/c.   Difficult to place patient  No       Consultants:  none  Procedures:  none  Antimicrobials:  Anti-infectives (From admission, onward)    Start     Dose/Rate Route Frequency Ordered Stop   02/02/21 1130  ceFAZolin (ANCEF) IVPB 1 g/50 mL premix        1 g 100 mL/hr over 30 Minutes  Intravenous Every 24 hours 02/02/21 1123 02/09/21 1129          Subjective: Bruising noted today to leg  Objective: Vitals:   02/01/21 0932 02/01/21 2020 02/02/21 0341 02/02/21 1110  BP: (!) 193/94 (!) 180/91 (!) 184/100 (!) 159/83  Pulse: 93 (!) 106 95 (!) 101  Resp:  18 20 19   Temp: 98.4 F (36.9 C) 98.9 F (37.2 C) 98.8 F (37.1 C) 98.7 F (37.1 C)  TempSrc: Oral Oral Oral Oral  SpO2: 100% 96% 99% 98%  Weight:   69.8 kg   Height:        Intake/Output Summary (Last 24 hours) at 02/02/2021 1511 Last data filed at 02/02/2021 1244 Gross per 24 hour  Intake 236 ml  Output 2200 ml  Net -1964 ml   Filed Weights   01/31/21 0116 02/01/21 0447 02/02/21 0341  Weight: 69 kg 68.7 kg 69.8 kg    Examination: General: No acute distress. Cardiovascular: RRR. Lungs: unlabored Abdomen: Soft, nontender, nondistended  Neurological: Alert and oriented 3. Moves all extremities 4 . Cranial nerves II through XII grossly intact. Extremities: RLE edema, erythema, mild warmth and TTP     Data Reviewed: I have personally reviewed following labs and imaging studies  CBC: Recent Labs  Lab 01/28/21 0804 01/29/21 0318 01/31/21 0219 02/01/21 0410 02/02/21 0254  WBC 12.9* 11.2* 10.8* 11.2* 16.7*  NEUTROABS 10.6* 8.9* 7.6 7.6 12.3*  HGB 10.7* 9.9* 9.6* 9.9* 9.9*  HCT 29.5* 27.9* 27.1* 27.4* 27.4*  MCV 94.9 96.5 95.8 93.8 94.5  PLT 198 180 246 283 333    Basic Metabolic Panel: Recent Labs  Lab 01/27/21 1711 01/28/21 0804 01/29/21 0318 01/30/21 0307 01/31/21 0219 02/01/21 0410 02/02/21 0254  NA 129*   < > 131* 130* 131* 134* 133*  K 4.5   < > 3.9 3.9 4.0 4.4 4.7  CL 99   < > 106 106 104 106 104  CO2 18*   < > 15* 14* 15* 17* 21*  GLUCOSE 127*   < > 97 93 122* 103* 111*  BUN 61*   < > 70* 67* 71* 73* 73*  CREATININE 5.60*   < > 8.29* 9.19* 10.17* 10.38* 9.88*  CALCIUM 6.4*   < > 7.2* 8.0* 8.5* 8.8* 8.7*  MG 2.0  --  1.8 1.8  --  1.9 1.8  PHOS  --   --  4.7* 4.7*   --  6.4* 5.3*   < > = values in this interval not displayed.    GFR: Estimated Creatinine Clearance: 8.5 mL/min (Frank [name]) (by C-G formula based on SCr of 9.88 mg/dL (H)).  Liver Function Tests: Recent Labs  Lab 01/29/21 0318 01/30/21 0307 01/31/21 0219 02/01/21 0410 02/02/21 0254  AST 813* 566* 378* 247* 147*  ALT 348* 313* 266* 238* 190*  ALKPHOS 50 50 49 50 49  BILITOT 0.5 1.0 0.8 0.9 0.6  PROT 4.9* 4.9* 4.8* 5.1* 5.2*  ALBUMIN 2.5* 2.3*  2.3* 2.2* 2.4* 2.4*    CBG: No results for input(s): GLUCAP in the last 168 hours.   Recent Results (from the past 240 hour(s))  Resp Panel  by RT-PCR (Flu Zyla Dascenzo&B, Covid) Nasopharyngeal Swab     Status: None   Collection Time: 01/26/21  4:06 PM   Specimen: Nasopharyngeal Swab; Nasopharyngeal(NP) swabs in vial transport medium  Result Value Ref Range Status   SARS Coronavirus 2 by RT PCR NEGATIVE NEGATIVE Final    Comment: (NOTE) SARS-CoV-2 target nucleic acids are NOT DETECTED.  The SARS-CoV-2 RNA is generally detectable in upper respiratory specimens during the acute phase of infection. The lowest concentration of SARS-CoV-2 viral copies this assay can detect is 138 copies/mL. Tashea Othman negative result does not preclude SARS-Cov-2 infection and should not be used as the sole basis for treatment or other patient management decisions. Woodie Trusty negative result may occur with  improper specimen collection/handling, submission of specimen other than nasopharyngeal swab, presence of viral mutation(s) within the areas targeted by this assay, and inadequate number of viral copies(<138 copies/mL). Samella Lucchetti negative result must be combined with clinical observations, patient history, and epidemiological information. The expected result is Negative.  Fact Sheet for Patients:  BloggerCourse.com  Fact Sheet for Healthcare Providers:  SeriousBroker.it  This test is no t yet approved or cleared by the [place] FDA  and  has been authorized for detection and/or diagnosis of SARS-CoV-2 by FDA under an Emergency Use Authorization (EUA). This EUA will remain  in effect (meaning this test can be used) for the duration of the COVID-19 declaration under Section 564(b)(1) of the Act, 21 U.S.C.section 360bbb-3(b)(1), unless the authorization is terminated  or revoked sooner.       Influenza Jailon Schaible by PCR NEGATIVE NEGATIVE Final   Influenza B by PCR NEGATIVE NEGATIVE Final    Comment: (NOTE) The Xpert Xpress SARS-CoV-2/FLU/RSV plus assay is intended as an aid in the diagnosis of influenza from Nasopharyngeal swab specimens and should not be used as Sanjana Folz sole basis for treatment. Nasal washings and aspirates are unacceptable for Xpert Xpress SARS-CoV-2/FLU/RSV testing.  Fact Sheet for Patients: BloggerCourse.com  Fact Sheet for Healthcare Providers: SeriousBroker.it  This test is not yet approved or cleared by the [place] FDA and has been authorized for detection and/or diagnosis of SARS-CoV-2 by FDA under an Emergency Use Authorization (EUA). This EUA will remain in effect (meaning this test can be used) for the duration of the COVID-19 declaration under Section 564(b)(1) of the Act, 21 U.S.C. section 360bbb-3(b)(1), unless the authorization is terminated or revoked.  Performed at [facility], [address] [place], [place] [address]   Urine Culture     Status: None   Collection Time: 01/27/21  7:33 PM   Specimen: Urine, Clean Catch  Result Value Ref Range Status   Specimen Description URINE, CLEAN CATCH  Final   Special Requests NONE  Final   Culture   Final    NO GROWTH Performed at [facility], [address], [place], [place] [address]    Report Status 01/29/2021 FINAL  Final  Gastrointestinal Panel by PCR , Stool     Status: None   Collection Time: 01/28/21  3:46 PM   Specimen: Stool  Result Value Ref Range Status    Campylobacter species NOT DETECTED NOT DETECTED Final   Plesimonas shigelloides NOT DETECTED NOT DETECTED Final   Salmonella species NOT DETECTED NOT DETECTED Final   Yersinia enterocolitica NOT DETECTED NOT DETECTED Final   Vibrio species NOT DETECTED NOT DETECTED Final   Vibrio cholerae NOT DETECTED NOT DETECTED Final   Enteroaggregative E coli (EAEC) NOT DETECTED NOT DETECTED Final  Enteropathogenic E coli (EPEC) NOT DETECTED NOT DETECTED Final   Enterotoxigenic E coli (ETEC) NOT DETECTED NOT DETECTED Final   Shiga like toxin producing E coli (STEC) NOT DETECTED NOT DETECTED Final   Shigella/Enteroinvasive E coli (EIEC) NOT DETECTED NOT DETECTED Final   Cryptosporidium NOT DETECTED NOT DETECTED Final   Cyclospora cayetanensis NOT DETECTED NOT DETECTED Final   Entamoeba histolytica NOT DETECTED NOT DETECTED Final   Giardia lamblia NOT DETECTED NOT DETECTED Final   Adenovirus F40/41 NOT DETECTED NOT DETECTED Final   Astrovirus NOT DETECTED NOT DETECTED Final   Norovirus GI/GII NOT DETECTED NOT DETECTED Final   Rotavirus Hadiya Spoerl NOT DETECTED NOT DETECTED Final   Sapovirus (I, II, IV, and V) NOT DETECTED NOT DETECTED Final    Comment: Performed at [facility], [address], [place], [place] [address]         Radiology Studies: CT ABDOMEN PELVIS WO CONTRAST  Result Date: 02/01/2021 CLINICAL DATA:  Retroperitoneal hematoma suspected hemoperitoneum EXAM: CT ABDOMEN AND PELVIS WITHOUT CONTRAST TECHNIQUE: Multidetector CT imaging of the abdomen and pelvis was performed following the standard protocol without IV contrast. COMPARISON:  01/27/2021 FINDINGS: Lower chest: Moderate bilateral pleural effusions, new since prior study. Bibasilar atelectasis. Hepatobiliary: No focal hepatic abnormality. Gallbladder unremarkable. Pancreas: No focal abnormality or ductal dilatation. Spleen: No focal abnormality.  Normal size. Adrenals/Urinary Tract: Previously seen right perinephric stranding  again noted, slightly decreased. Fluid continues inferiorly in the right retroperitoneum, also improved. No visible renal or adrenal mass. No hydronephrosis. Urinary bladder unremarkable. Stomach/Bowel: Stomach, large and small bowel grossly unremarkable. Vascular/Lymphatic: Scattered aortic atherosclerosis. No evidence of aneurysm or adenopathy. Reproductive: No visible focal abnormality. Other: Stranding noted in the perirectal space. No visible free intraperitoneal fluid or air. Stranding noted throughout the subcutaneous soft tissues diffusely suggesting anasarca. Musculoskeletal: No acute bony abnormality. IMPRESSION: Continued low-density throughout the right gluteal muscles, favor intramuscular hematoma. Continued right perinephric stranding and retroperitoneal stranding, improving since prior study. This could reflect resolving retroperitoneal hematoma. No visible ascites or hemoperitoneum currently. Anasarca like edema throughout the subcutaneous soft tissues. Electronically Signed   By: Charlett Nose M.D.   On: 02/01/2021 12:31   CT FEMUR RIGHT WO CONTRAST  Result Date: 02/01/2021 CLINICAL DATA:  Right leg pain and bruising. Rhabdomyolysis with down-trending creatinine kinase levels EXAM: CT OF THE LOWER RIGHT EXTREMITY WITHOUT CONTRAST TECHNIQUE: Multidetector CT imaging of the right lower extremity was performed according to the standard protocol. COMPARISON:  Right hip x-ray 01/26/2021.  Right femur MRI 01/27/2021 FINDINGS: Bones/Joint/Cartilage No acute fracture. No dislocation. Joint spaces of the right hip and right knee are maintained. No appreciable hip or knee joint effusion. The included portion of the right hemipelvis is intact. No right SI joint or pubic symphysis diastasis. No suspicious lytic or sclerotic bone lesion. No erosion. No periosteal elevation. Ligaments Suboptimally assessed by CT. Muscles and Tendons Relatively decreased attenuation of the right gluteus medius and gluteus  minimus muscles as well as the right quadratus femoris muscle. No intramuscular fluid collection is evident by CT. No acute tendinous abnormality is evident. Soft tissues Extensive circumferential subcutaneous edema throughout the right thigh and visualized right pelvis. There is also edema and fluid tracking within the fascial compartments of the right thigh. No organized fluid collections. No soft tissue gas. IMPRESSION: 1. Extensive circumferential subcutaneous edema throughout the right thigh and visualized right pelvis with edema and fluid tracking within the fascial compartments of the right thigh. No organized fluid collections or soft  tissue gas. Findings are nonspecific and can be seen in the setting of cellulitis and myofasciitis. 2. Relatively decreased attenuation of the right gluteus medius and gluteus minimus muscles as well as the right quadratus femoris muscle. Findings likely related to patient's known rhabdomyolysis. No evidence of myonecrosis by CT. 3. No acute osseous abnormality. Electronically Signed   By: Duanne Guess D.O.   On: 02/01/2021 15:06        Scheduled Meds:  amLODipine  5 mg Oral Daily   folic acid  1 mg Oral Daily   multivitamin with minerals  1 tablet Oral Daily   sodium bicarbonate  1,300 mg Oral BID   [START ON 02/04/2021] thiamine  100 mg Oral Daily   Continuous Infusions:   ceFAZolin (ANCEF) IV     thiamine injection 250 mg (02/02/21 0910)     LOS: 6 days    Time spent: over 30 min    Lacretia Nicks, MD [facility]   To contact the attending provider between 7A-7P or the covering provider during after hours 7P-7A, please log into the web site [email or web address] and access using universal [place] password for that web site. If you do not have the password, please call the hospital operator.  02/02/2021, 3:11 PM

## 2021-02-02 NOTE — Progress Notes (Signed)
Patient ID: Frank [name], male   DOB: 1970/06/03, 50 y.o.   MRN: [id number] [facility] Progress Note   Assessment/ Plan:   1. Acute kidney Injury: With acute kidney injury from rhabdomyolysis and trauma/hemorrhage of right kidney and a background of normal renal function/creatinine.  Nonoliguric overnight with some improvement of renal function noted.  He does not have any acute indications for dialysis and will continue conservative/supportive management with monitoring of his labs/clinical status. 2.  Rhabdomyolysis: Clinically better with downtrending CPK level, liberal fluid intake by mouth. 3.  Hyponatremia: Secondary to acute kidney injury and impaired free water handling, continue to monitor with supportive management of acute kidney injury at this time.  Not restricting fluid intake in the setting of rhabdomyolysis. 4.  Anemia: Likely exacerbated by retroperitoneal bleeding from trauma, no indication for PRBC transfusion. 5.  History of polysubstance abuse: Discussed risks and encourage cessation.  Subjective:   Seen yesterday by general surgery for evaluation of a right upper leg hematoma-nonsurgical and conservative management recommended.  Reports uncomfortable night with intermittent abdominal pain-feeling tired at this time.   Objective:   BP (!) 184/100 (BP Location: Right Wrist)   Pulse 95   Temp 98.8 F (37.1 C) (Oral)   Resp 20   Ht 5\' 8"  (1.727 m)   Wt 69.8 kg   SpO2 99%   BMI 23.40 kg/m   Intake/Output Summary (Last 24 hours) at 02/02/2021 0810 Last data filed at 02/02/2021 0600 Gross per 24 hour  Intake 240 ml  Output 1875 ml  Net -1635 ml   Weight change: 1.134 kg  Physical Exam: Gen: Appears comfortable resting on his left side in bed. CVS: Pulse regular rhythm, normal rate, S1 and S2 normal Resp: Poor inspiratory effort with decreased breath sounds over bases, no rales/rhonchi Abd: Soft, tender over right upper and lower quadrants with  areas of ecchymosis noted. Ext: No lower extremity edema noted  Imaging: CT ABDOMEN PELVIS WO CONTRAST  Result Date: 02/01/2021 CLINICAL DATA:  Retroperitoneal hematoma suspected hemoperitoneum EXAM: CT ABDOMEN AND PELVIS WITHOUT CONTRAST TECHNIQUE: Multidetector CT imaging of the abdomen and pelvis was performed following the standard protocol without IV contrast. COMPARISON:  01/27/2021 FINDINGS: Lower chest: Moderate bilateral pleural effusions, new since prior study. Bibasilar atelectasis. Hepatobiliary: No focal hepatic abnormality. Gallbladder unremarkable. Pancreas: No focal abnormality or ductal dilatation. Spleen: No focal abnormality.  Normal size. Adrenals/Urinary Tract: Previously seen right perinephric stranding again noted, slightly decreased. Fluid continues inferiorly in the right retroperitoneum, also improved. No visible renal or adrenal mass. No hydronephrosis. Urinary bladder unremarkable. Stomach/Bowel: Stomach, large and small bowel grossly unremarkable. Vascular/Lymphatic: Scattered aortic atherosclerosis. No evidence of aneurysm or adenopathy. Reproductive: No visible focal abnormality. Other: Stranding noted in the perirectal space. No visible free intraperitoneal fluid or air. Stranding noted throughout the subcutaneous soft tissues diffusely suggesting anasarca. Musculoskeletal: No acute bony abnormality. IMPRESSION: Continued low-density throughout the right gluteal muscles, favor intramuscular hematoma. Continued right perinephric stranding and retroperitoneal stranding, improving since prior study. This could reflect resolving retroperitoneal hematoma. No visible ascites or hemoperitoneum currently. Anasarca like edema throughout the subcutaneous soft tissues. Electronically Signed   By: 03/29/2021 M.D.   On: 02/01/2021 12:31   CT FEMUR RIGHT WO CONTRAST  Result Date: 02/01/2021 CLINICAL DATA:  Right leg pain and bruising. Rhabdomyolysis with down-trending creatinine  kinase levels EXAM: CT OF THE LOWER RIGHT EXTREMITY WITHOUT CONTRAST TECHNIQUE: Multidetector CT imaging of the right lower extremity was performed according to the  standard protocol. COMPARISON:  Right hip x-ray 01/26/2021.  Right femur MRI 01/27/2021 FINDINGS: Bones/Joint/Cartilage No acute fracture. No dislocation. Joint spaces of the right hip and right knee are maintained. No appreciable hip or knee joint effusion. The included portion of the right hemipelvis is intact. No right SI joint or pubic symphysis diastasis. No suspicious lytic or sclerotic bone lesion. No erosion. No periosteal elevation. Ligaments Suboptimally assessed by CT. Muscles and Tendons Relatively decreased attenuation of the right gluteus medius and gluteus minimus muscles as well as the right quadratus femoris muscle. No intramuscular fluid collection is evident by CT. No acute tendinous abnormality is evident. Soft tissues Extensive circumferential subcutaneous edema throughout the right thigh and visualized right pelvis. There is also edema and fluid tracking within the fascial compartments of the right thigh. No organized fluid collections. No soft tissue gas. IMPRESSION: 1. Extensive circumferential subcutaneous edema throughout the right thigh and visualized right pelvis with edema and fluid tracking within the fascial compartments of the right thigh. No organized fluid collections or soft tissue gas. Findings are nonspecific and can be seen in the setting of cellulitis and myofasciitis. 2. Relatively decreased attenuation of the right gluteus medius and gluteus minimus muscles as well as the right quadratus femoris muscle. Findings likely related to patient's known rhabdomyolysis. No evidence of myonecrosis by CT. 3. No acute osseous abnormality. Electronically Signed   By: Duanne Guess D.O.   On: 02/01/2021 15:06    Labs: BMET Recent Labs  Lab 01/27/21 1711 01/28/21 0804 01/29/21 0318 01/30/21 0307 01/31/21 0219  02/01/21 0410 02/02/21 0254  NA 129* 132* 131* 130* 131* 134* 133*  K 4.5 4.3 3.9 3.9 4.0 4.4 4.7  CL 99 103 106 106 104 106 104  CO2 18* 17* 15* 14* 15* 17* 21*  GLUCOSE 127* 90 97 93 122* 103* 111*  BUN 61* 67* 70* 67* 71* 73* 73*  CREATININE 5.60* 7.05* 8.29* 9.19* 10.17* 10.38* 9.88*  CALCIUM 6.4* 6.7* 7.2* 8.0* 8.5* 8.8* 8.7*  PHOS  --   --  4.7* 4.7*  --  6.4* 5.3*   CBC Recent Labs  Lab 01/29/21 0318 01/31/21 0219 02/01/21 0410 02/02/21 0254  WBC 11.2* 10.8* 11.2* 16.7*  NEUTROABS 8.9* 7.6 7.6 12.3*  HGB 9.9* 9.6* 9.9* 9.9*  HCT 27.9* 27.1* 27.4* 27.4*  MCV 96.5 95.8 93.8 94.5  PLT 180 246 283 333    Medications:     amLODipine  5 mg Oral Daily   folic acid  1 mg Oral Daily   multivitamin with minerals  1 tablet Oral Daily   sodium bicarbonate  1,300 mg Oral BID   [START ON 02/04/2021] thiamine  100 mg Oral Daily   Zetta Bills, MD 02/02/2021, 8:10 AM

## 2021-02-03 LAB — COMPREHENSIVE METABOLIC PANEL
ALT: 141 U/L — ABNORMAL HIGH (ref 0–44)
AST: 87 U/L — ABNORMAL HIGH (ref 15–41)
Albumin: 2.3 g/dL — ABNORMAL LOW (ref 3.5–5.0)
Alkaline Phosphatase: 54 U/L (ref 38–126)
Anion gap: 8 (ref 5–15)
BUN: 71 mg/dL — ABNORMAL HIGH (ref 6–20)
CO2: 21 mmol/L — ABNORMAL LOW (ref 22–32)
Calcium: 8.7 mg/dL — ABNORMAL LOW (ref 8.9–10.3)
Chloride: 105 mmol/L (ref 98–111)
Creatinine, Ser: 8.78 mg/dL — ABNORMAL HIGH (ref 0.61–1.24)
GFR, Estimated: 7 mL/min — ABNORMAL LOW (ref >60)
Glucose, Bld: 103 mg/dL — ABNORMAL HIGH (ref 70–99)
Potassium: 5.1 mmol/L (ref 3.5–5.1)
Sodium: 134 mmol/L — ABNORMAL LOW (ref 135–145)
Total Bilirubin: 0.6 mg/dL (ref 0.3–1.2)
Total Protein: 4.8 g/dL — ABNORMAL LOW (ref 6.5–8.1)

## 2021-02-03 LAB — CBC WITH DIFFERENTIAL/PLATELET
Abs Immature Granulocytes: 0.1 10*3/uL — ABNORMAL HIGH (ref 0.00–0.07)
Basophils Absolute: 0.1 10*3/uL (ref 0.0–0.1)
Basophils Relative: 0 %
Eosinophils Absolute: 0.4 10*3/uL (ref 0.0–0.5)
Eosinophils Relative: 3 %
HCT: 27.3 % — ABNORMAL LOW (ref 39.0–52.0)
Hemoglobin: 9.5 g/dL — ABNORMAL LOW (ref 13.0–17.0)
Immature Granulocytes: 1 %
Lymphocytes Relative: 15 %
Lymphs Abs: 2.1 10*3/uL (ref 0.7–4.0)
MCH: 33.6 pg (ref 26.0–34.0)
MCHC: 34.8 g/dL (ref 30.0–36.0)
MCV: 96.5 fL (ref 80.0–100.0)
Monocytes Absolute: 2.3 10*3/uL — ABNORMAL HIGH (ref 0.1–1.0)
Monocytes Relative: 16 %
Neutro Abs: 9.5 10*3/uL — ABNORMAL HIGH (ref 1.7–7.7)
Neutrophils Relative %: 65 %
Platelets: 373 10*3/uL (ref 150–400)
RBC: 2.83 MIL/uL — ABNORMAL LOW (ref 4.22–5.81)
RDW: 12.8 % (ref 11.5–15.5)
WBC: 14.5 10*3/uL — ABNORMAL HIGH (ref 4.0–10.5)
nRBC: 0 % (ref 0.0–0.2)

## 2021-02-03 LAB — PHOSPHORUS: Phosphorus: 5.3 mg/dL — ABNORMAL HIGH (ref 2.5–4.6)

## 2021-02-03 LAB — CK: Total CK: 2374 U/L — ABNORMAL HIGH (ref 49–397)

## 2021-02-03 LAB — MAGNESIUM: Magnesium: 1.9 mg/dL (ref 1.7–2.4)

## 2021-02-03 MED ORDER — LORAZEPAM 1 MG PO TABS
1.0000 mg | ORAL_TABLET | ORAL | Status: DC | PRN
  Administered 2021-02-03: 1 mg via ORAL
  Filled 2021-02-03: qty 1

## 2021-02-03 NOTE — Evaluation (Signed)
Occupational Therapy Evaluation Patient Details Name: Frank [name] MRN: [id number] DOB: [birth year] Today's Date: 02/03/2021   History of Present Illness Pt is 50 year old man admitted on 01/26/21 after a fall. + hypontension, AKI, rhabdo, R LE cellulitis, R side pelvic and hip musculature, extensive muscular and interfascial hemorrhage. PMH: ETOH abuse, cocaine abuse, THC.   Clinical Impression   Pt was independent prior to admission. 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 Pt lives with his wife in a boarding house. Pt presents with impaired cognition, decreased balance and pain. He requires up to min guard assist for OOB with RW. Attempted to ambulate without AD, but much safer with RW. Pt completes all activities slowly with up to min assist for ADL. Will follow acutely.      Recommendations for follow up therapy are one component of a multi-disciplinary discharge planning process, led by the attending physician.  Recommendations may be updated based on patient status, additional functional criteria and insurance authorization.   Follow Up Recommendations  Home health OT    Equipment Recommendations  3 in 1 bedside commode    Recommendations for Other Services       Precautions / Restrictions Precautions Precautions: Fall      Mobility Bed Mobility Overal bed mobility: Needs Assistance Bed Mobility: Supine to Sit;Sit to Supine     Supine to sit: Supervision Sit to supine: Supervision        Transfers Overall transfer level: Needs assistance Equipment used: None Transfers: Sit to/from Stand Sit to Stand: Min guard              Balance Overall balance assessment: Needs assistance   Sitting balance-Leahy Scale: Good     Standing balance support: No upper extremity supported Standing balance-Leahy Scale: Fair Standing balance comment: poor dynamic balance                           ADL either performed or assessed with clinical judgement   ADL  Overall ADL's : Needs assistance/impaired Eating/Feeding: Independent   Grooming: Wash/dry hands;Standing;Min guard   Upper Body Bathing: Minimal assistance;Sitting   Lower Body Bathing: Minimal assistance;Sit to/from stand   Upper Body Dressing : Minimal assistance;Sitting   Lower Body Dressing: Minimal assistance;Sit to/from stand Lower Body Dressing Details (indicate cue type and reason): set up for socks Toilet Transfer: Min IT sales professional Details (indicate cue type and reason): stood to urinate Toileting- Architect and Hygiene: Min guard;Sit to/from stand       Functional mobility during ADLs: Min guard;Rolling walker (min without RW)       Vision Ability to See in Adequate Light: 0 Adequate Patient Visual Report: No change from baseline       Perception     Praxis      Pertinent Vitals/Pain Pain Assessment: Faces Faces Pain Scale: Hurts even more Pain Location: back, neck Pain Descriptors / Indicators: Grimacing;Guarding;Discomfort;Sore Pain Intervention(s): Monitored during session;Repositioned     Hand Dominance Right   Extremity/Trunk Assessment Upper Extremity Assessment Upper Extremity Assessment: Overall WFL for tasks assessed   Lower Extremity Assessment Lower Extremity Assessment: Defer to PT evaluation   Cervical / Trunk Assessment Cervical / Trunk Assessment: Normal (+ pain)   Communication Communication Communication: No difficulties   Cognition Arousal/Alertness: Awake/alert Behavior During Therapy: Flat affect Overall Cognitive Status: Impaired/Different from baseline Area of Impairment: Following commands;Safety/judgement;Awareness;Problem solving  Following Commands: Follows one step commands with increased time Safety/Judgement: Decreased awareness of safety;Decreased awareness of deficits Awareness: Emergent Problem Solving: Slow processing;Decreased  initiation;Difficulty sequencing;Requires verbal cues;Requires tactile cues     General Comments       Exercises     Shoulder Instructions      Home Living Family/patient expects to be discharged to:: Private residence Living Arrangements: Spouse/significant other Available Help at Discharge: Family;Available 24 hours/day Type of Home: House (boarding) Home Access: Stairs to enter Secretary/administrator of Steps: 4   Home Layout: Able to live on main level with bedroom/bathroom     Bathroom Shower/Tub: Chief Strategy Officer: Standard     Home Equipment: None          Prior Functioning/Environment Level of Independence: Independent        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        OT Problem List: Decreased strength;Impaired balance (sitting and/or standing);Decreased knowledge of use of DME or AE;Decreased safety awareness;Decreased cognition;Pain      OT Treatment/Interventions: Self-care/ADL training;DME and/or AE instruction;Therapeutic activities;Patient/family education;Balance training;Cognitive remediation/compensation    OT Goals(Current goals can be found in the care plan section) Acute Rehab OT Goals Patient Stated Goal: return to work OT Goal Formulation: With patient Time For Goal Achievement: 02/17/21 Potential to Achieve Goals: Good ADL Goals Pt Will Perform Grooming: with modified independence;standing Pt Will Perform Lower Body Bathing: with modified independence;sit to/from stand Pt Will Perform Lower Body Dressing: with modified independence;sit to/from stand Pt Will Transfer to Toilet: with modified independence;ambulating;regular height toilet Pt Will Perform Toileting - Clothing Manipulation and hygiene: with modified independence;sit to/from stand Additional ADL Goal #1: Pt will participate in formal cognitive screening.  OT Frequency: Min 2X/week   Barriers to D/C:            Co-evaluation               AM-PAC OT "6 Clicks" Daily Activity     Outcome Measure Help from another person eating meals?: None Help from another person taking care of personal grooming?: A Little Help from another person toileting, which includes using toliet, bedpan, or urinal?: A Little Help from another person bathing (including washing, rinsing, drying)?: A Little Help from another person to put on and taking off regular upper body clothing?: A Little Help from another person to put on and taking off regular lower body clothing?: A Little 6 Click Score: 19   End of Session Equipment Utilized During Treatment: Rolling walker  Activity Tolerance: Patient tolerated treatment well Patient left: in bed;with call bell/phone within reach;with chair alarm set  OT Visit Diagnosis: Unsteadiness on feet (R26.81);Other abnormalities of gait and mobility (R26.89);Muscle weakness (generalized) (M62.81);Other symptoms and signs involving cognitive function                Time: 2683-4196 OT Time Calculation (min): 43 min Charges:  OT General Charges $OT Visit: 1 Visit OT Evaluation $OT Eval Moderate Complexity: 1 Mod OT Treatments $Self Care/Home Management : 8-22 mins $Therapeutic Activity: 8-22 mins  Martie Round, OTR/L Acute Rehabilitation Services Pager: [phone] Office: [phone]  Evern Bio 02/03/2021, 10:43 AM

## 2021-02-03 NOTE — Progress Notes (Signed)
PROGRESS NOTE    Frank [name]  QZE:[id number] DOB: 25-Nov-1970 DOA: 01/26/2021 PCP: Pcp, No    No chief complaint on file.   Brief Narrative:  50 yo AA male with hx of alcohol abuse, cocaine abuse, and THC use who presented after a fall.  Reportedly smelled of etoh and was slurring speech with EMS.  Initially hypotenesive, but this has improved.    He was admitted after a fall with AKI and rhabdomyolysis.  He had RLE numbness.  Imaging showed trauma to the right sided pelvic and hip musculature with extensive muscle tears and intramuscular and interfascial hemorrhage, edema and fluid.  Orthopedics saw him and noted no evidence of impending compartment syndrome, they suspect sensation should return with time.  General surgery was consulted in the setting of hemoperitoneum, they've now signed off.  Renal following with aki and rhabdomyolysis.   Assessment & Plan:   Principal Problem:   AKI (acute kidney injury) (HCC) Active Problems:   Elevated LFTs   Alcohol abuse   Polysubstance abuse (HCC)   Diarrhea  Hemoperitoneum/muscle tears and intramuscular and interfissural hemorrhage with edema and fluid: CT abd/pelvis concerning for trauma - perinephric stranding around R kidney with small volume hemorrhage within retroperitoneum, asymmetric, hypodense, and heterogeneous R gluteal musculature Appreciate surgery eval and assistance - now signed off  Worsened bruising 10/10, right flank into right thigh -> stop heparin - repeat CT -> low density throughout the right gluteal muscles, favor intramuscular hematoma, continued R perineprhic stranding and retroperitoneal stranding, improved since prior study - anasarca  10/10, appreciate surgery reeval - right upper leg hematoma, conservative management.  Pain relief.  Mobility.  Rhabdomyolysis  Acute Kidney Injury Related to cocaine use/trauma  CK gradually improving Off IV fluids. UOP improved [place] without hydro  UA with protein, 6-10  RBC Nephrology following.  Renal functions gradually improving.  Right Lower Extremity Cellulitis Started on Ancef with elevated WBC count.  We will continue until clinical improvement. Afebrile CT r femur 10/10 with extensive circumferential subcutaneous edema throughout right thigh and visualized pelvis with edema and fluid tracking within fascial compartments of R thigh  Hypertension Stable on amlodipine.  Anemia Hb relatively stable over past few days, downtrended overall from admission  Alcohol Abuse  CIWA protocol Encourage cessation 7 days since hospitalization.  Changed to oral Ativan as needed.  Elevated LFT's 2/2 etoh abuse and rhabdo, trend - improving RUQ [place] notable for sludge Hepatitis panel negative.  Polysubstance abuse Encourage cessation  DVT prophylaxis: SCD Code Status: full Family Communication: Wife on the phone. Disposition:   Status is: inpatient    Dispo: The patient is from: Home              Anticipated d/c is to:  pending possibly home with home health.              Patient currently is not medically stable to d/c.   Difficult to place patient No       Consultants:  Nephrology.  Procedures:  none  Antimicrobials:  Anti-infectives (From admission, onward)    Start     Dose/Rate Route Frequency Ordered Stop   02/02/21 1130  ceFAZolin (ANCEF) IVPB 1 g/50 mL premix        1 g 100 mL/hr over 30 Minutes Intravenous Every 24 hours 02/02/21 1123 02/09/21 1129          Subjective: Patient seen and examined.  He feels some swelling on the right side of the face,  right arm and right thigh.  He was able to get out of the bed with physical therapy with some help. Right thigh hurts on mobility.  Objective: Vitals:   02/02/21 2027 02/03/21 0630 02/03/21 0637 02/03/21 1154  BP: (!) 158/82 (!) 158/86  139/70  Pulse: 99 96  95  Resp: 19 18  19   Temp: 98.4 F (36.9 C) 99.1 F (37.3 C)  99.1 F (37.3 C)  TempSrc: Oral Oral  Oral   SpO2: 97% 96%  96%  Weight:   71.8 kg   Height:        Intake/Output Summary (Last 24 hours) at 02/03/2021 1342 Last data filed at 02/03/2021 1034 Gross per 24 hour  Intake 617.64 ml  Output 1715 ml  Net -1097.36 ml   Filed Weights   02/01/21 0447 02/02/21 0341 02/03/21 0637  Weight: 68.7 kg 69.8 kg 71.8 kg    Examination: General: No acute distress. Cardiovascular: RRR. Lungs: unlabored Abdomen: Soft, nontender, nondistended  Neurological: Alert and oriented 3. Moves all extremities 4 . Cranial nerves II through XII grossly intact. Extremities: RLE edema, erythema, mild warmth and TTP     Data Reviewed: I have personally reviewed following labs and imaging studies  CBC: Recent Labs  Lab 01/29/21 0318 01/31/21 0219 02/01/21 0410 02/02/21 0254 02/03/21 0258  WBC 11.2* 10.8* 11.2* 16.7* 14.5*  NEUTROABS 8.9* 7.6 7.6 12.3* 9.5*  HGB 9.9* 9.6* 9.9* 9.9* 9.5*  HCT 27.9* 27.1* 27.4* 27.4* 27.3*  MCV 96.5 95.8 93.8 94.5 96.5  PLT 180 246 283 333 373    Basic Metabolic Panel: Recent Labs  Lab 01/29/21 0318 01/30/21 0307 01/31/21 0219 02/01/21 0410 02/02/21 0254 02/03/21 0258  NA 131* 130* 131* 134* 133* 134*  K 3.9 3.9 4.0 4.4 4.7 5.1  CL 106 106 104 106 104 105  CO2 15* 14* 15* 17* 21* 21*  GLUCOSE 97 93 122* 103* 111* 103*  BUN 70* 67* 71* 73* 73* 71*  CREATININE 8.29* 9.19* 10.17* 10.38* 9.88* 8.78*  CALCIUM 7.2* 8.0* 8.5* 8.8* 8.7* 8.7*  MG 1.8 1.8  --  1.9 1.8 1.9  PHOS 4.7* 4.7*  --  6.4* 5.3* 5.3*    GFR: Estimated Creatinine Clearance: 9.5 mL/min (A) (by C-G formula based on SCr of 8.78 mg/dL (H)).  Liver Function Tests: Recent Labs  Lab 01/30/21 0307 01/31/21 0219 02/01/21 0410 02/02/21 0254 02/03/21 0258  AST 566* 378* 247* 147* 87*  ALT 313* 266* 238* 190* 141*  ALKPHOS 50 49 50 49 54  BILITOT 1.0 0.8 0.9 0.6 0.6  PROT 4.9* 4.8* 5.1* 5.2* 4.8*  ALBUMIN 2.3*  2.3* 2.2* 2.4* 2.4* 2.3*    CBG: No results for input(s): GLUCAP  in the last 168 hours.   Recent Results (from the past 240 hour(s))  Resp Panel by RT-PCR (Flu A&B, Covid) Nasopharyngeal Swab     Status: None   Collection Time: 01/26/21  4:06 PM   Specimen: Nasopharyngeal Swab; Nasopharyngeal(NP) swabs in vial transport medium  Result Value Ref Range Status   SARS Coronavirus 2 by RT PCR NEGATIVE NEGATIVE Final    Comment: (NOTE) SARS-CoV-2 target nucleic acids are NOT DETECTED.  The SARS-CoV-2 RNA is generally detectable in upper respiratory specimens during the acute phase of infection. The lowest concentration of SARS-CoV-2 viral copies this assay can detect is 138 copies/mL. A negative result does not preclude SARS-Cov-2 infection and should not be used as the sole basis for treatment or other patient management decisions. A  negative result may occur with  improper specimen collection/handling, submission of specimen other than nasopharyngeal swab, presence of viral mutation(s) within the areas targeted by this assay, and inadequate number of viral copies(<138 copies/mL). A negative result must be combined with clinical observations, patient history, and epidemiological information. The expected result is Negative.  Fact Sheet for Patients:  BloggerCourse.com  Fact Sheet for Healthcare Providers:  SeriousBroker.it  This test is no t yet approved or cleared by the [place] FDA and  has been authorized for detection and/or diagnosis of SARS-CoV-2 by FDA under an Emergency Use Authorization (EUA). This EUA will remain  in effect (meaning this test can be used) for the duration of the COVID-19 declaration under Section 564(b)(1) of the Act, 21 U.S.C.section 360bbb-3(b)(1), unless the authorization is terminated  or revoked sooner.       Influenza A by PCR NEGATIVE NEGATIVE Final   Influenza B by PCR NEGATIVE NEGATIVE Final    Comment: (NOTE) The Xpert Xpress SARS-CoV-2/FLU/RSV plus  assay is intended as an aid in the diagnosis of influenza from Nasopharyngeal swab specimens and should not be used as a sole basis for treatment. Nasal washings and aspirates are unacceptable for Xpert Xpress SARS-CoV-2/FLU/RSV testing.  Fact Sheet for Patients: BloggerCourse.com  Fact Sheet for Healthcare Providers: SeriousBroker.it  This test is not yet approved or cleared by the [place] FDA and has been authorized for detection and/or diagnosis of SARS-CoV-2 by FDA under an Emergency Use Authorization (EUA). This EUA will remain in effect (meaning this test can be used) for the duration of the COVID-19 declaration under Section 564(b)(1) of the Act, 21 U.S.C. section 360bbb-3(b)(1), unless the authorization is terminated or revoked.  Performed at [facility], [address], [place], [place] [address]   Urine Culture     Status: None   Collection Time: 01/27/21  7:33 PM   Specimen: Urine, Clean Catch  Result Value Ref Range Status   Specimen Description URINE, CLEAN CATCH  Final   Special Requests NONE  Final   Culture   Final    NO GROWTH Performed at [facility], [address], [place], [place] [address]    Report Status 01/29/2021 FINAL  Final  Gastrointestinal Panel by PCR , Stool     Status: None   Collection Time: 01/28/21  3:46 PM   Specimen: Stool  Result Value Ref Range Status   Campylobacter species NOT DETECTED NOT DETECTED Final   Plesimonas shigelloides NOT DETECTED NOT DETECTED Final   Salmonella species NOT DETECTED NOT DETECTED Final   Yersinia enterocolitica NOT DETECTED NOT DETECTED Final   Vibrio species NOT DETECTED NOT DETECTED Final   Vibrio cholerae NOT DETECTED NOT DETECTED Final   Enteroaggregative E coli (EAEC) NOT DETECTED NOT DETECTED Final   Enteropathogenic E coli (EPEC) NOT DETECTED NOT DETECTED Final   Enterotoxigenic E coli (ETEC) NOT DETECTED NOT DETECTED Final    Shiga like toxin producing E coli (STEC) NOT DETECTED NOT DETECTED Final   Shigella/Enteroinvasive E coli (EIEC) NOT DETECTED NOT DETECTED Final   Cryptosporidium NOT DETECTED NOT DETECTED Final   Cyclospora cayetanensis NOT DETECTED NOT DETECTED Final   Entamoeba histolytica NOT DETECTED NOT DETECTED Final   Giardia lamblia NOT DETECTED NOT DETECTED Final   Adenovirus F40/41 NOT DETECTED NOT DETECTED Final   Astrovirus NOT DETECTED NOT DETECTED Final   Norovirus GI/GII NOT DETECTED NOT DETECTED Final   Rotavirus A NOT DETECTED NOT DETECTED Final  Sapovirus (I, II, IV, and V) NOT DETECTED NOT DETECTED Final    Comment: Performed at [facility], [address] [place], [place] [address]         Radiology Studies: No results found.      Scheduled Meds:  amLODipine  5 mg Oral Daily   folic acid  1 mg Oral Daily   multivitamin with minerals  1 tablet Oral Daily   polyethylene glycol  17 g Oral BID   sodium bicarbonate  1,300 mg Oral BID   [START ON 02/04/2021] thiamine  100 mg Oral Daily   Continuous Infusions:   ceFAZolin (ANCEF) IV 1 g (02/03/21 1222)   thiamine injection 250 mg (02/02/21 0910)     LOS: 7 days    Time spent: 34 minutes    Dorcas Carrow, MD [facility]   02/03/2021, 1:42 PM

## 2021-02-03 NOTE — Evaluation (Signed)
Physical Therapy Evaluation Patient Details Name: Frank [name] MRN: [id number] DOB: April 25, 1971 Today's Date: 02/03/2021  History of Present Illness  Pt is 50 year old man admitted on 01/26/21 after a fall. + hypontension, AKI, rhabdo, R LE cellulitis, R side pelvic and hip musculature, extensive muscular and interfascial hemorrhage. PMH: ETOH abuse, cocaine abuse, THC.  Clinical Impression  PTA pt living with wife in boarding house with 4 steps to enter. Pt reports independence, working as a Transport planner. Pt is currently limited in safe mobility by decreased cognition in particular decreased safety awareness and knowledge of DME use, in presence of increased R hip and posterior hip pain increased with weightbearing. Pt is supervision for bed mobility, min guard for transfers and min A for ambulation of 20 feet with RW. PT recommending HHPT at discharge to improve mobility. PT will continue to follow acutely.        Recommendations for follow up therapy are one component of a multi-disciplinary discharge planning process, led by the attending physician.  Recommendations may be updated based on patient status, additional functional criteria and insurance authorization.  Follow Up Recommendations Home health PT;Supervision for mobility/OOB    Equipment Recommendations  Rolling walker with 5" wheels       Precautions / Restrictions Precautions Precautions: Fall Restrictions Weight Bearing Restrictions: No      Mobility  Bed Mobility Overal bed mobility: Needs Assistance Bed Mobility: Supine to Sit;Sit to Supine     Supine to sit: Supervision Sit to supine: Supervision        Transfers Overall transfer level: Needs assistance Equipment used: None Transfers: Sit to/from Stand Sit to Stand: Min guard         General transfer comment: min guard for safety  Ambulation/Gait Ambulation/Gait assistance: Min assist Gait Distance (Feet): 20 Feet Assistive device: Rolling  walker (2 wheeled) Gait Pattern/deviations: Step-to pattern;Decreased stance time - right;Decreased step length - left;Decreased weight shift to right;Antalgic Gait velocity: slowed Gait velocity interpretation: <1.31 ft/sec, indicative of household ambulator General Gait Details: min A for steadying with RW, maximal vc for RW use and keeping on floor with ambulation especially with navigation into and out of bathroom, pt with increasing groaning and multiple stops required due to increasing R hip and posterior thigh pain with mobility         Balance Overall balance assessment: Needs assistance   Sitting balance-Leahy Scale: Good     Standing balance support: No upper extremity supported Standing balance-Leahy Scale: Fair Standing balance comment: poor dynamic balance                             Pertinent Vitals/Pain Pain Assessment: 0-10 Pain Score: 5  Faces Pain Scale: Hurts even more Pain Location: R hip down back of R thigh, Pain Descriptors / Indicators: Grimacing;Guarding;Discomfort;Sore Pain Intervention(s): Monitored during session    Home Living Family/patient expects to be discharged to:: Private residence Living Arrangements: Spouse/significant other Available Help at Discharge: Family;Available 24 hours/day Type of Home: House (boarding) Home Access: Stairs to enter   Secretary/administrator of Steps: 4 Home Layout: Able to live on main level with bedroom/bathroom Home Equipment: None      Prior Function Level of Independence: Independent         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     Hand Dominance   Dominant Hand: Right    Extremity/Trunk Assessment   Upper Extremity Assessment Upper Extremity Assessment:  Defer to OT evaluation    Lower Extremity Assessment Lower Extremity Assessment: RLE deficits/detail RLE Deficits / Details: R posterior hip and thigh pain with movement, strength grossly 3+/5 difficult to assess due to  pain and decreased command follow RLE: Unable to fully assess due to pain RLE Sensation:  (decribes numbness and tingling in R thigh)    Cervical / Trunk Assessment Cervical / Trunk Assessment: Normal (+ pain)  Communication   Communication: No difficulties  Cognition Arousal/Alertness: Awake/alert Behavior During Therapy: Flat affect Overall Cognitive Status: Impaired/Different from baseline Area of Impairment: Following commands;Safety/judgement;Awareness;Problem solving                       Following Commands: Follows one step commands with increased time Safety/Judgement: Decreased awareness of safety;Decreased awareness of deficits Awareness: Emergent Problem Solving: Slow processing;Decreased initiation;Difficulty sequencing;Requires verbal cues;Requires tactile cues        General Comments General comments (skin integrity, edema, etc.): VSS on RA, vasilates between almost being asleep to hollering out in pain        Assessment/Plan    PT Assessment Patient needs continued PT services  PT Problem List Decreased strength;Decreased activity tolerance;Decreased range of motion;Decreased balance;Decreased mobility;Decreased cognition;Decreased coordination;Decreased knowledge of use of DME;Decreased safety awareness;Pain       PT Treatment Interventions DME instruction;Gait training;Stair training;Functional mobility training;Therapeutic activities;Therapeutic exercise;Balance training;Cognitive remediation;Patient/family education    PT Goals (Current goals can be found in the Care Plan section)  Acute Rehab PT Goals Patient Stated Goal: return to work PT Goal Formulation: With patient Time For Goal Achievement: 02/17/21 Potential to Achieve Goals: Fair    Frequency Min 3X/week    AM-PAC PT "6 Clicks" Mobility  Outcome Measure Help needed turning from your back to your side while in a flat bed without using bedrails?: None Help needed moving from lying  on your back to sitting on the side of a flat bed without using bedrails?: None Help needed moving to and from a bed to a chair (including a wheelchair)?: A Little Help needed standing up from a chair using your arms (e.g., wheelchair or bedside chair)?: A Little Help needed to walk in hospital room?: A Little Help needed climbing 3-5 steps with a railing? : Total 6 Click Score: 18    End of Session Equipment Utilized During Treatment: Gait belt Activity Tolerance: Patient limited by pain Patient left: in bed;with call bell/phone within reach;with bed alarm set Nurse Communication: Mobility status;Patient requests pain meds PT Visit Diagnosis: Unsteadiness on feet (R26.81);Other abnormalities of gait and mobility (R26.89);Difficulty in walking, not elsewhere classified (R26.2);Muscle weakness (generalized) (M62.81);Pain Pain - Right/Left: Right Pain - part of body: Hip    Time: 0076-2263 PT Time Calculation (min) (ACUTE ONLY): 17 min   Charges:   PT Evaluation $PT Eval Moderate Complexity: 1 Mod          Tamila Gaulin B. Beverely Risen PT, DPT Acute Rehabilitation Services Pager [phone] Office [phone]   Elon Alas Fleet 02/03/2021, 12:53 PM

## 2021-02-03 NOTE — Progress Notes (Signed)
Patient ID: Frank [name], male   DOB: 07/18/1970, 50 y.o.   MRN: [id number] [facility] Progress Note   Assessment/ Plan:   1. Acute kidney Injury: With acute kidney injury from rhabdomyolysis and trauma/hemorrhage of right kidney and a background of normal renal function/creatinine.  Remains nonoliguric and noted to have improving kidney function on labs this morning without any acute electrolyte abnormality/volume concerns.  No indication for dialysis, continue to monitor expectantly for renal recovery. 2.  Rhabdomyolysis: Clinically better with downtrending CPK level, liberal fluid intake by mouth.  Monitor closely for rebound hyperkalemia from tissue resorption. 3.  Hyponatremia: Secondary to acute kidney injury and impaired free water handling, continue to monitor with supportive management of acute kidney injury at this time.  Not restricting fluid intake in the setting of rhabdomyolysis. 4.  Anemia: Likely exacerbated by retroperitoneal bleeding from trauma, no indication for PRBC transfusion. 5.  History of polysubstance abuse: Discussed risks and encouraged cessation.  Subjective:   Planes of some intermittent right upper extremity discomfort along with right thigh pain/numbness.  Denies any numbness or tingling of fingertips or toes.  Denies chest pain or shortness of breath.   Objective:   BP (!) 158/86 (BP Location: Left Arm)   Pulse 96   Temp 99.1 F (37.3 C) (Oral)   Resp 18   Ht 5\' 8"  (1.727 m)   Wt 71.8 kg   SpO2 96%   BMI 24.05 kg/m   Intake/Output Summary (Last 24 hours) at 02/03/2021 04/05/2021 Last data filed at 02/03/2021 0300 Gross per 24 hour  Intake 613.64 ml  Output 2315 ml  Net -1701.36 ml   Weight change: 1.95 kg  Physical Exam: Gen: Comfortably resting in bed, awake/alert CVS: Pulse regular rhythm, normal rate, S1 and S2 normal Resp: Clear to auscultation bilaterally without rales/rhonchi Abd: Soft, tender over right upper and lower  quadrants and right upper thigh. Ext: No lower extremity edema noted  Imaging: CT ABDOMEN PELVIS WO CONTRAST  Result Date: 02/01/2021 CLINICAL DATA:  Retroperitoneal hematoma suspected hemoperitoneum EXAM: CT ABDOMEN AND PELVIS WITHOUT CONTRAST TECHNIQUE: Multidetector CT imaging of the abdomen and pelvis was performed following the standard protocol without IV contrast. COMPARISON:  01/27/2021 FINDINGS: Lower chest: Moderate bilateral pleural effusions, new since prior study. Bibasilar atelectasis. Hepatobiliary: No focal hepatic abnormality. Gallbladder unremarkable. Pancreas: No focal abnormality or ductal dilatation. Spleen: No focal abnormality.  Normal size. Adrenals/Urinary Tract: Previously seen right perinephric stranding again noted, slightly decreased. Fluid continues inferiorly in the right retroperitoneum, also improved. No visible renal or adrenal mass. No hydronephrosis. Urinary bladder unremarkable. Stomach/Bowel: Stomach, large and small bowel grossly unremarkable. Vascular/Lymphatic: Scattered aortic atherosclerosis. No evidence of aneurysm or adenopathy. Reproductive: No visible focal abnormality. Other: Stranding noted in the perirectal space. No visible free intraperitoneal fluid or air. Stranding noted throughout the subcutaneous soft tissues diffusely suggesting anasarca. Musculoskeletal: No acute bony abnormality. IMPRESSION: Continued low-density throughout the right gluteal muscles, favor intramuscular hematoma. Continued right perinephric stranding and retroperitoneal stranding, improving since prior study. This could reflect resolving retroperitoneal hematoma. No visible ascites or hemoperitoneum currently. Anasarca like edema throughout the subcutaneous soft tissues. Electronically Signed   By: 03/29/2021 M.D.   On: 02/01/2021 12:31   CT FEMUR RIGHT WO CONTRAST  Result Date: 02/01/2021 CLINICAL DATA:  Right leg pain and bruising. Rhabdomyolysis with down-trending creatinine  kinase levels EXAM: CT OF THE LOWER RIGHT EXTREMITY WITHOUT CONTRAST TECHNIQUE: Multidetector CT imaging of the right lower extremity was performed according  to the standard protocol. COMPARISON:  Right hip x-ray 01/26/2021.  Right femur MRI 01/27/2021 FINDINGS: Bones/Joint/Cartilage No acute fracture. No dislocation. Joint spaces of the right hip and right knee are maintained. No appreciable hip or knee joint effusion. The included portion of the right hemipelvis is intact. No right SI joint or pubic symphysis diastasis. No suspicious lytic or sclerotic bone lesion. No erosion. No periosteal elevation. Ligaments Suboptimally assessed by CT. Muscles and Tendons Relatively decreased attenuation of the right gluteus medius and gluteus minimus muscles as well as the right quadratus femoris muscle. No intramuscular fluid collection is evident by CT. No acute tendinous abnormality is evident. Soft tissues Extensive circumferential subcutaneous edema throughout the right thigh and visualized right pelvis. There is also edema and fluid tracking within the fascial compartments of the right thigh. No organized fluid collections. No soft tissue gas. IMPRESSION: 1. Extensive circumferential subcutaneous edema throughout the right thigh and visualized right pelvis with edema and fluid tracking within the fascial compartments of the right thigh. No organized fluid collections or soft tissue gas. Findings are nonspecific and can be seen in the setting of cellulitis and myofasciitis. 2. Relatively decreased attenuation of the right gluteus medius and gluteus minimus muscles as well as the right quadratus femoris muscle. Findings likely related to patient's known rhabdomyolysis. No evidence of myonecrosis by CT. 3. No acute osseous abnormality. Electronically Signed   By: Duanne Guess D.O.   On: 02/01/2021 15:06    Labs: BMET Recent Labs  Lab 01/28/21 0804 01/29/21 0318 01/30/21 0307 01/31/21 0219 02/01/21 0410  02/02/21 0254 02/03/21 0258  NA 132* 131* 130* 131* 134* 133* 134*  K 4.3 3.9 3.9 4.0 4.4 4.7 5.1  CL 103 106 106 104 106 104 105  CO2 17* 15* 14* 15* 17* 21* 21*  GLUCOSE 90 97 93 122* 103* 111* 103*  BUN 67* 70* 67* 71* 73* 73* 71*  CREATININE 7.05* 8.29* 9.19* 10.17* 10.38* 9.88* 8.78*  CALCIUM 6.7* 7.2* 8.0* 8.5* 8.8* 8.7* 8.7*  PHOS  --  4.7* 4.7*  --  6.4* 5.3* 5.3*   CBC Recent Labs  Lab 01/31/21 0219 02/01/21 0410 02/02/21 0254 02/03/21 0258  WBC 10.8* 11.2* 16.7* 14.5*  NEUTROABS 7.6 7.6 12.3* 9.5*  HGB 9.6* 9.9* 9.9* 9.5*  HCT 27.1* 27.4* 27.4* 27.3*  MCV 95.8 93.8 94.5 96.5  PLT 246 283 333 373    Medications:     amLODipine  5 mg Oral Daily   folic acid  1 mg Oral Daily   multivitamin with minerals  1 tablet Oral Daily   polyethylene glycol  17 g Oral BID   sodium bicarbonate  1,300 mg Oral BID   [START ON 02/04/2021] thiamine  100 mg Oral Daily   Zetta Bills, MD 02/03/2021, 8:12 AM

## 2021-02-04 DIAGNOSIS — R778 Other specified abnormalities of plasma proteins: Secondary | ICD-10-CM

## 2021-02-04 DIAGNOSIS — T796XXA Traumatic ischemia of muscle, initial encounter: Secondary | ICD-10-CM

## 2021-02-04 LAB — RENAL FUNCTION PANEL
Albumin: 2.8 g/dL — ABNORMAL LOW (ref 3.5–5.0)
Anion gap: 7 (ref 5–15)
BUN: 66 mg/dL — ABNORMAL HIGH (ref 6–20)
CO2: 26 mmol/L (ref 22–32)
Calcium: 9.1 mg/dL (ref 8.9–10.3)
Chloride: 102 mmol/L (ref 98–111)
Creatinine, Ser: 7.11 mg/dL — ABNORMAL HIGH (ref 0.61–1.24)
GFR, Estimated: 9 mL/min — ABNORMAL LOW (ref >60)
Glucose, Bld: 103 mg/dL — ABNORMAL HIGH (ref 70–99)
Phosphorus: 4.6 mg/dL (ref 2.5–4.6)
Potassium: 5.8 mmol/L — ABNORMAL HIGH (ref 3.5–5.1)
Sodium: 135 mmol/L (ref 135–145)

## 2021-02-04 LAB — POTASSIUM: Potassium: 5.5 mmol/L — ABNORMAL HIGH (ref 3.5–5.1)

## 2021-02-04 LAB — TROPONIN I (HIGH SENSITIVITY): Troponin I (High Sensitivity): 297 ng/L (ref <18)

## 2021-02-04 MED ORDER — SODIUM ZIRCONIUM CYCLOSILICATE 10 G PO PACK
10.0000 g | PACK | Freq: Three times a day (TID) | ORAL | Status: AC
  Administered 2021-02-04 – 2021-02-05 (×4): 10 g via ORAL
  Filled 2021-02-04 (×4): qty 1

## 2021-02-04 MED ORDER — ALBUTEROL SULFATE (2.5 MG/3ML) 0.083% IN NEBU
2.5000 mg | INHALATION_SOLUTION | Freq: Once | RESPIRATORY_TRACT | Status: AC
  Administered 2021-02-04: 2.5 mg via RESPIRATORY_TRACT
  Filled 2021-02-04: qty 3

## 2021-02-04 MED ORDER — INSULIN ASPART 100 UNIT/ML IJ SOLN
5.0000 [IU] | Freq: Once | INTRAMUSCULAR | Status: AC
  Administered 2021-02-04: 5 [IU] via INTRAVENOUS

## 2021-02-04 MED ORDER — DEXTROSE 50 % IV SOLN
1.0000 | Freq: Once | INTRAVENOUS | Status: AC
  Administered 2021-02-04: 50 mL via INTRAVENOUS
  Filled 2021-02-04: qty 50

## 2021-02-04 MED ORDER — CEPHALEXIN 250 MG PO CAPS
500.0000 mg | ORAL_CAPSULE | Freq: Two times a day (BID) | ORAL | Status: DC
  Administered 2021-02-04 – 2021-02-05 (×3): 500 mg via ORAL
  Filled 2021-02-04 (×3): qty 2

## 2021-02-04 MED ORDER — CALCIUM GLUCONATE-NACL 1-0.675 GM/50ML-% IV SOLN
1.0000 g | Freq: Once | INTRAVENOUS | Status: AC
  Administered 2021-02-04: 1000 mg via INTRAVENOUS
  Filled 2021-02-04: qty 50

## 2021-02-04 NOTE — Progress Notes (Signed)
Potassium = 5.8, although creatinine improving. MD paged.

## 2021-02-04 NOTE — Progress Notes (Signed)
Patient ID: Frank [name], male   DOB: 12-02-1970, 50 y.o.   MRN: [id number] [facility] Progress Note   Assessment/ Plan:   1. Acute kidney Injury: With acute kidney injury from rhabdomyolysis and trauma/hemorrhage of right kidney and a background of normal renal function/creatinine.  Nonoliguric with improving creatinine and without indication for dialysis at this time. 2.  Rhabdomyolysis: Clinically better with downtrending CPK level, liberal fluid intake by mouth.  Managing hyperkalemia. 3.  Hyponatremia: Secondary to acute kidney injury and impaired free water handling, continue to monitor with supportive management of acute kidney injury at this time.  Not restricting fluid intake in the setting of rhabdomyolysis. 4.  Anemia: Likely exacerbated by retroperitoneal bleeding from trauma, no indication for PRBC transfusion. 5.  History of polysubstance abuse: Discussed risks and encouraged cessation. 6.  Hyperkalemia: Likely secondary to unrestricted oral intake in the setting of acute kidney injury-we will place on renal diet and order for [facility].  Discussed low potassium diet with the patient while recovering from AKI.  Subjective:   He reports intermittent right thigh pain/numbness.  We discussed his labs and he informs me that he has been drinking a lot of orange juice that could account for his hyperkalemia.  We discussed alternatives (apple, grape and cranberry)   Objective:   BP (!) 142/93 (BP Location: Right Arm)   Pulse 92   Temp 99 F (37.2 C) (Oral)   Resp 17   Ht 5\' 8"  (1.727 m)   Wt 69.4 kg   SpO2 95%   BMI 23.26 kg/m   Intake/Output Summary (Last 24 hours) at 02/04/2021 02/06/2021 Last data filed at 02/03/2021 2116 Gross per 24 hour  Intake 480 ml  Output --  Net 480 ml   Weight change: -2.359 kg  Physical Exam: Gen: Comfortably resting in bed and easy to awaken/engage in conversation. CVS: Pulse regular rhythm, normal rate, S1 and S2 normal Resp:  Clear to auscultation bilaterally without rales/rhonchi Abd: Soft, tender over right upper and lower quadrants and right upper thigh. Ext: No lower extremity edema noted  Imaging: No results found.  Labs: BMET Recent Labs  Lab 01/29/21 0318 01/30/21 0307 01/31/21 0219 02/01/21 0410 02/02/21 0254 02/03/21 0258 02/04/21 0131 02/04/21 0643  NA 131* 130* 131* 134* 133* 134* 135  --   K 3.9 3.9 4.0 4.4 4.7 5.1 5.8* 5.5*  CL 106 106 104 106 104 105 102  --   CO2 15* 14* 15* 17* 21* 21* 26  --   GLUCOSE 97 93 122* 103* 111* 103* 103*  --   BUN 70* 67* 71* 73* 73* 71* 66*  --   CREATININE 8.29* 9.19* 10.17* 10.38* 9.88* 8.78* 7.11*  --   CALCIUM 7.2* 8.0* 8.5* 8.8* 8.7* 8.7* 9.1  --   PHOS 4.7* 4.7*  --  6.4* 5.3* 5.3* 4.6  --    CBC Recent Labs  Lab 01/31/21 0219 02/01/21 0410 02/02/21 0254 02/03/21 0258  WBC 10.8* 11.2* 16.7* 14.5*  NEUTROABS 7.6 7.6 12.3* 9.5*  HGB 9.6* 9.9* 9.9* 9.5*  HCT 27.1* 27.4* 27.4* 27.3*  MCV 95.8 93.8 94.5 96.5  PLT 246 283 333 373    Medications:     albuterol  2.5 mg Nebulization Once   amLODipine  5 mg Oral Daily   folic acid  1 mg Oral Daily   multivitamin with minerals  1 tablet Oral Daily   polyethylene glycol  17 g Oral BID   sodium bicarbonate  1,300 mg Oral BID   sodium zirconium cyclosilicate  10 g Oral TID   thiamine  100 mg Oral Daily   Zetta Bills, MD 02/04/2021, 8:26 AM

## 2021-02-04 NOTE — Consult Note (Signed)
Reason for Consult: Atypical chest pain/minimally elevated high-sensitivity troponin I in the setting of acute renal failure and rhabdomyolysis Referring Physician: [facility]  Frank [name] is an 50 y.o. male.  HPI: Patient is 50 year old male with past medical history significant for asthma, bronchitis, tobacco abuse, polysubstance abuse, was admitted 8 days ago following fall from the fence and was noted to be in acute renal failure with rhabdomyolysis and hypotension.  Cardiology consultation is called today because patient had localized retrosternal chest pain lasting few minutes after eating food last night and also was noted to be short of breath requiring breathing treatment EKG done showed minimal peaking of the T waves and was noted to have potassium of 5.8.  No acute ischemic changes were noted on the EKG high-sensitivity troponin I was drawn which was minimally elevated.  Patient's renal function has gradually improving.  Patient denies any history of anginal chest pain denies nausea vomiting diaphoresis.  States overall feels well except complaining of right hip and leg pain.  Past Medical History:  Diagnosis Date   Arthritis    Asthma    Gout    Rib fracture     No past surgical history on file.  No family history on file.  Social History:  reports that he has been smoking cigarettes. He has been smoking an average of .2 packs per day. He does not have any smokeless tobacco history on file. He reports current alcohol use of about 15.0 standard drinks per week. He reports current drug use. Drugs: Marijuana, Cocaine, and "Crack" cocaine.  Allergies:  Allergies  Allergen Reactions   Other Hives and Itching    Allergic to white chocolate    Medications: I have reviewed the patient's current medications.  Results for orders placed or performed during the hospital encounter of 01/26/21 (from the past 48 hour(s))  CBC with Differential/Platelet     Status: Abnormal    Collection Time: 02/03/21  2:58 AM  Result Value Ref Range   WBC 14.5 (H) 4.0 - 10.5 K/uL   RBC 2.83 (L) 4.22 - 5.81 MIL/uL   Hemoglobin 9.5 (L) 13.0 - 17.0 g/dL   HCT 78.6 (L) 76.7 - 20.9 %   MCV 96.5 80.0 - 100.0 fL   MCH 33.6 26.0 - 34.0 pg   MCHC 34.8 30.0 - 36.0 g/dL   RDW 47.0 96.2 - 83.6 %   Platelets 373 150 - 400 K/uL   nRBC 0.0 0.0 - 0.2 %   Neutrophils Relative % 65 %   Neutro Abs 9.5 (H) 1.7 - 7.7 K/uL   Lymphocytes Relative 15 %   Lymphs Abs 2.1 0.7 - 4.0 K/uL   Monocytes Relative 16 %   Monocytes Absolute 2.3 (H) 0.1 - 1.0 K/uL   Eosinophils Relative 3 %   Eosinophils Absolute 0.4 0.0 - 0.5 K/uL   Basophils Relative 0 %   Basophils Absolute 0.1 0.0 - 0.1 K/uL   Immature Granulocytes 1 %   Abs Immature Granulocytes 0.10 (H) 0.00 - 0.07 K/uL    Comment: Performed at [facility], [address] [place], [place] [address]  Comprehensive metabolic panel     Status: Abnormal   Collection Time: 02/03/21  2:58 AM  Result Value Ref Range   Sodium 134 (L) 135 - 145 mmol/L   Potassium 5.1 3.5 - 5.1 mmol/L   Chloride 105 98 - 111 mmol/L   CO2 21 (L) 22 - 32 mmol/L   Glucose, Bld 103 (  H) 70 - 99 mg/dL    Comment: Glucose reference range applies only to samples taken after fasting for at least 8 hours.   BUN 71 (H) 6 - 20 mg/dL   Creatinine, Ser 7.82 (H) 0.61 - 1.24 mg/dL   Calcium 8.7 (L) 8.9 - 10.3 mg/dL   Total Protein 4.8 (L) 6.5 - 8.1 g/dL   Albumin 2.3 (L) 3.5 - 5.0 g/dL   AST 87 (H) 15 - 41 U/L   ALT 141 (H) 0 - 44 U/L   Alkaline Phosphatase 54 38 - 126 U/L   Total Bilirubin 0.6 0.3 - 1.2 mg/dL   GFR, Estimated 7 (L) >60 mL/min    Comment: (NOTE) Calculated using the CKD-EPI Creatinine Equation (2021)    Anion gap 8 5 - 15    Comment: Performed at [facility], [address] [place], [place] [address]  Magnesium     Status: None   Collection Time: 02/03/21  2:58 AM  Result Value Ref Range   Magnesium 1.9 1.7 - 2.4 mg/dL    Comment: Performed  at [facility], [address], [place], [place] [address]  Phosphorus     Status: Abnormal   Collection Time: 02/03/21  2:58 AM  Result Value Ref Range   Phosphorus 5.3 (H) 2.5 - 4.6 mg/dL    Comment: Performed at [facility], [address], [place], [place] [address]  CK     Status: Abnormal   Collection Time: 02/03/21  2:58 AM  Result Value Ref Range   Total CK 2,374 (H) 49 - 397 U/L    Comment: Performed at [facility], [address] [place], [place] [address]  Renal function panel     Status: Abnormal   Collection Time: 02/04/21  1:31 AM  Result Value Ref Range   Sodium 135 135 - 145 mmol/L   Potassium 5.8 (H) 3.5 - 5.1 mmol/L   Chloride 102 98 - 111 mmol/L   CO2 26 22 - 32 mmol/L   Glucose, Bld 103 (H) 70 - 99 mg/dL    Comment: Glucose reference range applies only to samples taken after fasting for at least 8 hours.   BUN 66 (H) 6 - 20 mg/dL   Creatinine, Ser 8.41 (H) 0.61 - 1.24 mg/dL   Calcium 9.1 8.9 - 32.4 mg/dL   Phosphorus 4.6 2.5 - 4.6 mg/dL   Albumin 2.8 (L) 3.5 - 5.0 g/dL   GFR, Estimated 9 (L) >60 mL/min    Comment: (NOTE) Calculated using the CKD-EPI Creatinine Equation (2021)    Anion gap 7 5 - 15    Comment: Performed at [facility], [address] [place], [place] [address]  Potassium     Status: Abnormal   Collection Time: 02/04/21  6:43 AM  Result Value Ref Range   Potassium 5.5 (H) 3.5 - 5.1 mmol/L    Comment: Performed at [facility], [address] [place], [place] [address]  Troponin I (High Sensitivity)     Status: Abnormal   Collection Time: 02/04/21  6:43 AM  Result Value Ref Range   Troponin I (High Sensitivity) 297 (HH) <18 ng/L    Comment: CRITICAL RESULT CALLED TO, READ BACK BY AND VERIFIED WITH: K. Colette Ribas, RN [phone] 02/04/21 L. KLAR (NOTE) Elevated high sensitivity troponin I (hsTnI) values and significant  changes across serial measurements may suggest ACS but many other  chronic and acute conditions  are  known to elevate hsTnI results.  Refer to the Links section for chest pain algorithms and additional  guidance. Performed at [facility], [address] [place], [place] [address]     No results found.  Review of Systems  Constitutional:  Negative for chills, diaphoresis and fever.  HENT:  Negative for sore throat.   Eyes:  Negative for pain.  Respiratory:  Positive for shortness of breath.   Cardiovascular:  Positive for chest pain.  Gastrointestinal:  Negative for abdominal distention.  Genitourinary:  Negative for difficulty urinating.  Neurological: Negative.  Negative for dizziness.  Blood pressure (!) 142/98, pulse 96, temperature 99 F (37.2 C), temperature source Oral, resp. rate 17, height 5\' 8"  (1.727 m), weight 69.4 kg, SpO2 100 %. Physical Exam Constitutional:      Appearance: Normal appearance.  HENT:     Head: Normocephalic and atraumatic.  Eyes:     Extraocular Movements: Extraocular movements intact.     Pupils: Pupils are equal, round, and reactive to light.  Neck:     Vascular: No carotid bruit.  Cardiovascular:     Rate and Rhythm: Normal rate and regular rhythm.     Heart sounds: Murmur (Soft systolic murmur noted no S3 gallop) heard.  Pulmonary:     Effort: Pulmonary effort is normal.     Breath sounds: Normal breath sounds.  Abdominal:     General: Abdomen is flat.     Palpations: Abdomen is soft.  Musculoskeletal:        General: Swelling present.     Cervical back: Normal range of motion and neck supple.     Right lower leg: Edema present.     Left lower leg: No edema.  Skin:    General: Skin is warm and dry.  Neurological:     General: No focal deficit present.     Mental Status: He is alert and oriented to person, place, and time.    Assessment/Plan: Noncardiac chest pain Minimally elevated high-sensitivity troponin I in the setting of acute renal failure/rhabdomyolysis Status post hypokalemia Resolving acute kidney  injury Rhabdomyolysis Hyponatremia Anemia Status post hypokalemia Plan Continue present management Will recheck second set of high-sensitivity troponin I No further cardiac work-up. Patient encouraged p.o. fluids Follow serial CPK and renal function 02/04/2021, 11:23 AM

## 2021-02-04 NOTE — Progress Notes (Signed)
PROGRESS NOTE  Frank [name]    DOB: 04-07-1971, 50 y.o.  FTD:[id number]  PCP: Pcp, No   Code Status: Full Code   DOA: 01/26/2021   LOS: 8  Brief Narrative of Current Hospitalization  Frank [name] is a 50 y.o. male with a PMH significant for alcohol dependence, cocaine and THC use. They presented from home to the ED on 01/26/2021 with fall. In the ED, it was found that they had rhabdomyolysis, AKI. They were treated with liberal fluids.  Patient was admitted to medicine service for further workup and management of AKI, rhabdomyolysis as outlined in detail below.  02/04/21 -acute event of chest pain with elevated troponins in setting of hyperkalemia and peaked T waves on ECG  Assessment & Plan  Principal Problem:   AKI (acute kidney injury) (HCC) Active Problems:   Elevated LFTs   Alcohol abuse   Polysubstance abuse (HCC)   Diarrhea  Hemoperitoneum/muscle tears and intramuscular and interfissural hemorrhage with edema and fluid: General surgery and orthopedic surgery have signed off as non-surgical management.  - PRN pain management - mobilization as tolerated   Rhabdomyolysis  Acute Kidney Injury-creatinine improved today 8.78> 7.11. CK had trended down and discontinued monitoring.  - nephrology following, appreciate recommendations - renal diet - bicarb BID  Hyperkalemia-potassium 5.8 this morning which is not unexpected given his rhabdomyolysis and AKI.  He already received 5 units insulin this morning.  Ordered EKG and albuterol nebulizer.  EKG showing peaked T waves. -Calcium gluconate ordered x1 - albuterol nebulizer x1 - 5 units insulin + amp d50 x1 - lokelma x2 ordered - cardiology consulted - repeat BMP this afternoon  Elevated troponins to 297 with reported chest pain overnight, asymptomatic currently. Endorses worsened dyspnea on exertion. Difficult to assess ST segments with peaked t waves.  - repeat ECG s/p hyperkalemia treatment - cardiology following,  appreciate recommendations   Right Lower Extremity Cellulitis- afebrile. Skin does not appear to be infected but instead bruised at this time. Will transition to oral Abx today.  - keflex (10/13-10/15), discontinued Ancef (10/11-10/12)  Hypertension Stable on amlodipine.   Anemia- stable   Alcohol Abuse- unlikely to have withdrawal effects at this point - CIWA protocol, without PRN ativan Encourage cessation   Elevated LFT's 2/2 etoh abuse and rhabdo, trend - improving RUQ [place] notable for sludge Hepatitis panel negative.   Polysubstance abuse Encourage cessation  DVT prophylaxis: Place and maintain sequential compression device Start: 02/01/21 0905 SCDs Start: 01/26/21 2220   Diet:  Diet Orders (From admission, onward)     Start     Ordered   01/26/21 2220  Diet regular Room service appropriate? Yes; Fluid consistency: Thin  Diet effective now       Question Answer Comment  Room service appropriate? Yes   Fluid consistency: Thin      01/26/21 2219            Subjective 02/04/21    Pt reports continued pain in leg, back, head but overall doing alright. Denies current chest pain or breathing problems. Endorses SOB with short ambulation to sink and chest pain overnight.   Disposition Plan & Communication  Status is: Inpatient  Remains inpatient appropriate because:Ongoing diagnostic testing needed not appropriate for outpatient work up  Dispo: The patient is from: Home              Anticipated d/c is to: Home              Patient currently  is not medically stable to d/c.     Family Communication: none   Consults, Procedures, Significant Events  Consultants:  Nephrology cardiology  Procedures/significant events:  none  Antimicrobials:  Anti-infectives (From admission, onward)    Start     Dose/Rate Route Frequency Ordered Stop   02/02/21 1130  ceFAZolin (ANCEF) IVPB 1 g/50 mL premix        1 g 100 mL/hr over 30 Minutes Intravenous Every 24 hours  02/02/21 1123 02/09/21 1129        Objective   Vitals:   02/03/21 0637 02/03/21 1154 02/03/21 2023 02/04/21 0332  BP:  139/70 140/87 (!) 142/93  Pulse:  95 93 92  Resp:  19 15 17   Temp:  99.1 F (37.3 C) 99 F (37.2 C) 99 F (37.2 C)  TempSrc:  Oral Oral Oral  SpO2:  96% 98% 95%  Weight: 71.8 kg   69.4 kg  Height:        Intake/Output Summary (Last 24 hours) at 02/04/2021 0741 Last data filed at 02/03/2021 2116 Gross per 24 hour  Intake 480 ml  Output --  Net 480 ml   Filed Weights   02/02/21 0341 02/03/21 0637 02/04/21 0332  Weight: 69.8 kg 71.8 kg 69.4 kg    Patient BMI: Body mass index is 23.26 kg/m.   Physical Exam: General: awake, alert, NAD HEENT: atraumatic, clear conjunctiva, anicteric sclera, moist mucus membranes, hearing grossly normal Respiratory: normal respiratory effort. Cardiovascular: quick capillary refill  Nervous: A&O x3. no gross focal neurologic deficits, normal speech Extremities: moves all equally, no edema, normal tone Skin: dry, intact, normal temperature, normal color with exception of mild edema and bruising of right thigh/hip. Tender to palpation. Psychiatry: normal mood, congruent affect  Labs   I have personally reviewed following labs and imaging studies No results displayed because visit has over 200 results.      Imaging Studies  No results found. Medications   Scheduled Meds:  amLODipine  5 mg Oral Daily   folic acid  1 mg Oral Daily   multivitamin with minerals  1 tablet Oral Daily   polyethylene glycol  17 g Oral BID   sodium bicarbonate  1,300 mg Oral BID   sodium zirconium cyclosilicate  10 g Oral TID   thiamine  100 mg Oral Daily     LOS: 8 days   Time spent: >81min  21m, DO [facility] 02/04/2021, 7:41 AM   To contact the [facility] Attending or Consulting provider for this patient: Check the care team in [facility] for a) attending/consulting [facility] provider listed and b) the [facility] team listed Log  into [email or web address] and use [place]'s universal password to access. If you do not have the password, please contact the hospital operator. Locate the [facility] provider you are looking for under [facility] and page to a number that you can be directly reached. If you still have difficulty reaching the provider, please page the [facility] (Director on Call) for the Hospitalists listed on amion for assistance.

## 2021-02-05 LAB — RENAL FUNCTION PANEL
Albumin: 2.4 g/dL — ABNORMAL LOW (ref 3.5–5.0)
Anion gap: 8 (ref 5–15)
BUN: 56 mg/dL — ABNORMAL HIGH (ref 6–20)
CO2: 27 mmol/L (ref 22–32)
Calcium: 8.8 mg/dL — ABNORMAL LOW (ref 8.9–10.3)
Chloride: 101 mmol/L (ref 98–111)
Creatinine, Ser: 6.03 mg/dL — ABNORMAL HIGH (ref 0.61–1.24)
GFR, Estimated: 10 mL/min — ABNORMAL LOW (ref >60)
Glucose, Bld: 119 mg/dL — ABNORMAL HIGH (ref 70–99)
Phosphorus: 5.4 mg/dL — ABNORMAL HIGH (ref 2.5–4.6)
Potassium: 4.8 mmol/L (ref 3.5–5.1)
Sodium: 136 mmol/L (ref 135–145)

## 2021-02-05 MED ORDER — HYDROMORPHONE HCL 1 MG/ML IJ SOLN
0.5000 mg | Freq: Three times a day (TID) | INTRAMUSCULAR | Status: DC | PRN
  Administered 2021-02-05: 0.5 mg via INTRAVENOUS
  Filled 2021-02-05: qty 0.5

## 2021-02-05 MED ORDER — OXYCODONE HCL 5 MG PO TABS
5.0000 mg | ORAL_TABLET | ORAL | Status: DC | PRN
Start: 2021-02-05 — End: 2021-02-06
  Administered 2021-02-05 – 2021-02-06 (×3): 5 mg via ORAL
  Filled 2021-02-05 (×3): qty 1

## 2021-02-05 NOTE — Progress Notes (Addendum)
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  Nonoliguric with improving creatinine and without indication for dialysis at this time.  Except continue to have renal recovery.  Strict ins and outs and daily lab.  #  Hyperkalemia: Likely secondary to unrestricted oral intake in the setting of acute kidney injury.  Potassium level improved to normal today.  Continue low potassium diet and on Lokelma.  # Rhabdomyolysis: Clinically better with downtrending CPK level, liberal fluid intake by mouth.    #  Hyponatremia: Secondary to acute kidney injury and impaired free water handling.  Improved now.  #  Anemia: Likely exacerbated by retroperitoneal bleeding from trauma, no indication for PRBC transfusion.  #  History of polysubstance abuse: Discussed risks and encouraged cessation.  Subjective: Seen and examined at bedside.  Reports right leg pain.  He had urine output around 1.5 L in 24 hours.  Renal labs improving.  No nausea, vomiting, chest pain, shortness of breath. Objective Vital signs in last 24 hours: Vitals:   02/04/21 0911 02/04/21 1208 02/04/21 1955 02/05/21 0505  BP: (!) 142/98 (!) 140/99 (!) 149/89 (!) 142/92  Pulse: 96 91 93 95  Resp:  18 20 18   Temp:  98.4 F (36.9 C) 98.1 F (36.7 C) 98.5 F (36.9 C)  TempSrc:  Oral Oral Oral  SpO2:  100% 98% 94%  Weight:    67.3 kg  Height:       Weight change: -2.132 kg  Intake/Output Summary (Last 24 hours) at 02/05/2021 0827 Last data filed at 02/05/2021 0700 Gross per 24 hour  Intake 710.06 ml  Output 1550 ml  Net -839.94 ml       Labs: Basic Metabolic Panel: Recent Labs  Lab 02/03/21 0258 02/04/21 0131 02/04/21 0643 02/05/21 0356  NA 134* 135  --  136  K 5.1 5.8* 5.5* 4.8  CL 105 102  --  101  CO2 21* 26  --  27  GLUCOSE 103* 103*   --  119*  BUN 71* 66*  --  56*  CREATININE 8.78* 7.11*  --  6.03*  CALCIUM 8.7* 9.1  --  8.8*  PHOS 5.3* 4.6  --  5.4*   Liver Function Tests: Recent Labs  Lab 02/01/21 0410 02/02/21 0254 02/03/21 0258 02/04/21 0131 02/05/21 0356  AST 247* 147* 87*  --   --   ALT 238* 190* 141*  --   --   ALKPHOS 50 49 54  --   --   BILITOT 0.9 0.6 0.6  --   --   PROT 5.1* 5.2* 4.8*  --   --   ALBUMIN 2.4* 2.4* 2.3* 2.8* 2.4*   No results for input(s): LIPASE, AMYLASE in the last 168 hours. No results for input(s): AMMONIA in the last 168 hours. CBC: Recent Labs  Lab 01/31/21 0219 02/01/21 0410 02/02/21 0254 02/03/21 0258  WBC 10.8* 11.2* 16.7* 14.5*  NEUTROABS 7.6 7.6 12.3* 9.5*  HGB 9.6* 9.9* 9.9* 9.5*  HCT 27.1* 27.4* 27.4* 27.3*  MCV 95.8 93.8 94.5 96.5  PLT 246 283 333 373   Cardiac Enzymes: Recent Labs  Lab 01/30/21 0307 01/31/21 0219 02/01/21 0410 02/02/21 0254 02/03/21 0258  CKTOTAL 24,598* 13,285* 8,360* 4,443* 2,374*   CBG: No results for input(s): GLUCAP in the last 168 hours.  Iron Studies: No results for input(s): IRON, TIBC, TRANSFERRIN, FERRITIN in  the last 72 hours. Studies/Results: No results found.  Medications: Infusions:   Scheduled Medications:  amLODipine  5 mg Oral Daily   cephALEXin  500 mg Oral Q12H   folic acid  1 mg Oral Daily   multivitamin with minerals  1 tablet Oral Daily   polyethylene glycol  17 g Oral BID   sodium bicarbonate  1,300 mg Oral BID   sodium zirconium cyclosilicate  10 g Oral TID   thiamine  100 mg Oral Daily    have reviewed scheduled and prn medications.  Physical Exam: General:NAD, comfortable Heart:RRR, s1s2 nl Lungs:clear b/l, no crackle Abdomen:soft, Non-tender, non-distended Extremities:No edema Neurology: Alert, awake and following commands  Frank [name] Jaynie Collins 02/05/2021,8:27 AM  LOS: 9 days

## 2021-02-05 NOTE — Progress Notes (Signed)
Occupational Therapy Treatment Patient Details Name: Frank [name] MRN: [id number] DOB: December 27, 1970 Today's Date: 02/05/2021   History of present illness Pt is 50 year old man admitted on 01/26/21 after a fall. + hypontension, AKI, rhabdo, R LE cellulitis, R side pelvic and hip musculature, extensive muscular and interfascial hemorrhage. PMH: ETOH abuse, cocaine abuse, THC.   OT comments  Pt completed standing grooming at sink x 20 minutes walked to bathroom and returned to bed with supervision. Reminders to use RW to minimize pain and increase safety. Pt able to don socks in sitting and front opening gown in standing with set up. Updated d/c recommendation to no OT.    Recommendations for follow up therapy are one component of a multi-disciplinary discharge planning process, led by the attending physician.  Recommendations may be updated based on patient status, additional functional criteria and insurance authorization.    Follow Up Recommendations  No OT follow up    Equipment Recommendations  3 in 1 bedside commode    Recommendations for Other Services      Precautions / Restrictions Precautions Precautions: Fall       Mobility Bed Mobility Overal bed mobility: Modified Independent             General bed mobility comments: HOB up    Transfers Overall transfer level: Needs assistance Equipment used: Rolling walker (2 wheeled) Transfers: Sit to/from Stand Sit to Stand: Supervision         General transfer comment: for safety    Balance Overall balance assessment: Needs assistance   Sitting balance-Leahy Scale: Good     Standing balance support: No upper extremity supported Standing balance-Leahy Scale: Fair Standing balance comment: stood x 20 minutes at sink                           ADL either performed or assessed with clinical judgement   ADL Overall ADL's : Needs assistance/impaired     Grooming: Modified independent;Oral  care;Wash/dry face;Standing (shaved)           Upper Body Dressing : Standing;Supervision/safety   Lower Body Dressing: Set up;Sitting/lateral leans Lower Body Dressing Details (indicate cue type and reason): socks Toilet Transfer: Supervision/safety;Ambulation;RW Toilet Transfer Details (indicate cue type and reason): stood to urinate Toileting- Architect and Hygiene: Supervision/safety       Functional mobility during ADLs: Modified independent;Rolling walker       Vision       Perception     Praxis      Cognition Arousal/Alertness: Awake/alert Behavior During Therapy: Flat affect Overall Cognitive Status: Impaired/Different from baseline Area of Impairment: Safety/judgement                         Safety/Judgement: Decreased awareness of safety;Decreased awareness of deficits     General Comments: likely baseline, repeatedly releasing walker        Exercises     Shoulder Instructions       General Comments      Pertinent Vitals/ Pain       Pain Assessment: Faces Faces Pain Scale: Hurts little more Pain Location: R hip down back of R thigh, Pain Descriptors / Indicators: Grimacing;Guarding;Discomfort;Sore Pain Intervention(s): Monitored during session;Repositioned  Home Living Family/patient expects to be discharged to:: Private residence Living Arrangements: Spouse/significant other Available Help at Discharge: Family;Available 24 hours/day Type of Home: House  Prior Functioning/Environment              Frequency  Min 2X/week        Progress Toward Goals  OT Goals(current goals can now be found in the care plan section)  Progress towards OT goals: Progressing toward goals  Acute Rehab OT Goals Patient Stated Goal: return to work OT Goal Formulation: With patient Time For Goal Achievement: 02/17/21 Potential to Achieve Goals: Good  Plan Discharge plan needs to  be updated    Co-evaluation                 AM-PAC OT "6 Clicks" Daily Activity     Outcome Measure   Help from another person eating meals?: None Help from another person taking care of personal grooming?: None Help from another person toileting, which includes using toliet, bedpan, or urinal?: None Help from another person bathing (including washing, rinsing, drying)?: None Help from another person to put on and taking off regular upper body clothing?: None Help from another person to put on and taking off regular lower body clothing?: None 6 Click Score: 24    End of Session Equipment Utilized During Treatment: Rolling walker  OT Visit Diagnosis: Unsteadiness on feet (R26.81);Other abnormalities of gait and mobility (R26.89);Muscle weakness (generalized) (M62.81);Other symptoms and signs involving cognitive function   Activity Tolerance Patient tolerated treatment well   Patient Left in bed;with call bell/phone within reach;with chair alarm set   Nurse Communication          Time: [phone] OT Time Calculation (min): 50 min  Charges: OT General Charges $OT Visit: 1 Visit OT Treatments $Self Care/Home Management : 38-52 mins  Martie Round, OTR/L Acute Rehabilitation Services Pager: [phone] Office: [phone]  Evern Bio 02/05/2021, 9:39 AM

## 2021-02-05 NOTE — Progress Notes (Signed)
Subjective:  Patient denies any chest pain or shortness of breath.renal function gradually improving  Objective:  Vital Signs in the last 24 hours: Temp:  [98.1 F (36.7 C)-98.5 F (36.9 C)] 98.5 F (36.9 C) (10/14 0505) Pulse Rate:  [91-95] 95 (10/14 0505) Resp:  [18-20] 18 (10/14 0505) BP: (140-149)/(89-99) 142/92 (10/14 0505) SpO2:  [94 %-100 %] 94 % (10/14 0505) Weight:  [67.3 kg] 67.3 kg (10/14 0505)  Intake/Output from previous day: 10/13 0701 - 10/14 0700 In: 710.1 [P.O.:700; IV Piggyback:10.1] Out: 1550 [Urine:1550] Intake/Output from this shift: No intake/output data recorded.  Physical Exam: Exam unchanged  Lab Results: Recent Labs    02/03/21 0258  WBC 14.5*  HGB 9.5*  PLT 373   Recent Labs    02/04/21 0131 02/04/21 0643 02/05/21 0356  NA 135  --  136  K 5.8* 5.5* 4.8  CL 102  --  101  CO2 26  --  27  GLUCOSE 103*  --  119*  BUN 66*  --  56*  CREATININE 7.11*  --  6.03*   No results for input(s): TROPONINI in the last 72 hours.  Invalid input(s): CK, MB Hepatic Function Panel Recent Labs    02/03/21 0258 02/04/21 0131 02/05/21 0356  PROT 4.8*  --   --   ALBUMIN 2.3*   < > 2.4*  AST 87*  --   --   ALT 141*  --   --   ALKPHOS 54  --   --   BILITOT 0.6  --   --    < > = values in this interval not displayed.   No results for input(s): CHOL in the last 72 hours. No results for input(s): PROTIME in the last 72 hours.  Imaging: No results found.  Cardiac Studies:  Assessment/Plan:  Noncardiac chest pain Minimally elevated high-sensitivity troponin I in the setting of acute renal failure/rhabdomyolysis Status post hypokalemia Resolving acute kidney injury Rhabdomyolysis Hyponatremia Anemia Status post hypokalemia Plan Continue present management. I will sign off.  Please call if needed  LOS: 9 days    Frank [name] 02/05/2021, 9:26 AM

## 2021-02-05 NOTE — Progress Notes (Signed)
Physical Therapy Treatment Patient Details Name: Frank [name] MRN: [id number] DOB: January 08, 1971 Today's Date: 02/05/2021   History of Present Illness Pt is 50 year old man admitted on 01/26/21 after a fall. + hypontension, AKI, rhabdo, R LE cellulitis, R side pelvic and hip musculature, extensive muscular and interfascial hemorrhage. PMH: ETOH abuse, cocaine abuse, THC.    PT Comments    Pt is making great progress with mobility. He was able to ambulate up to ~150 ft, shift weight off COG, reach off BOS, and rotate trunk and head while standing without any overt LOB, min guard-supervision for safety. Pt is limited in gait fluidity and speed by R lower extremity edema and pain. Educated pt on elevating leg, performing exercises to improve ROM and swelling, and continuing to ambulate as tolerated. Pt is capable of ambulating without needing nursing assistance, notified pt and RN of this. Due to pt's rapid improvement and that he will likely return to baseline quickly as his pain continues to improve, updated d/c recs to no PT follow-up. Will continue to follow acutely to maximize his functional mobility prior to d/c.   Recommendations for follow up therapy are one component of a multi-disciplinary discharge planning process, led by the attending physician.  Recommendations may be updated based on patient status, additional functional criteria and insurance authorization.  Follow Up Recommendations  No PT follow up;Supervision for mobility/OOB     Equipment Recommendations  Rolling walker with 5" wheels    Recommendations for Other Services       Precautions / Restrictions Precautions Precautions: Fall (low) Restrictions Weight Bearing Restrictions: No     Mobility  Bed Mobility Overal bed mobility: Modified Independent Bed Mobility: Supine to Sit;Sit to Supine     Supine to sit: Modified independent (Device/Increase time) Sit to supine: Modified independent (Device/Increase  time)   General bed mobility comments: Pt able to perform all bed mobility aspects without assistance.    Transfers Overall transfer level: Needs assistance Equipment used: None Transfers: Sit to/from Stand Sit to Stand: Supervision         General transfer comment: Supervision for safety, slow to rise due to pain, but no LOB.  Ambulation/Gait Ambulation/Gait assistance: Min guard;Supervision Gait Distance (Feet): 150 Feet Assistive device: None Gait Pattern/deviations: Step-to pattern;Decreased stance time - right;Decreased step length - left;Decreased weight shift to right;Antalgic Gait velocity: slowed Gait velocity interpretation: <1.31 ft/sec, indicative of household ambulator General Gait Details: Pt with very slow (increasingly slowed gait as distance and pain increased) antalgic gait pattern, displaying difficulty shifting weight to R leg and tendency to even plantarflex the R ankle to avoid weight on it. No LOB, even when turning trunk and head or reaching off BOS, min guard > supervision for safety. Encouraged pt to continue to mobilize in hallways, notified RN he could do so without assistance.   Stairs             Wheelchair Mobility    Modified Rankin (Stroke Patients Only)       Balance Overall balance assessment: Needs assistance Sitting-balance support: No upper extremity supported;Feet supported Sitting balance-Leahy Scale: Normal     Standing balance support: No upper extremity supported Standing balance-Leahy Scale: Good Standing balance comment: Pt with good balance, rotating trunk and head and reaching off BOS and shifting weight off COG to almost perform SLS without LOB. Difficulty with R leg weight acceptance impacting his balance minorly.  Cognition Arousal/Alertness: Awake/alert Behavior During Therapy: WFL for tasks assessed/performed Overall Cognitive Status: Impaired/Different from baseline Area  of Impairment: Problem solving;Attention                   Current Attention Level: Sustained         Problem Solving: Slow processing;Requires verbal cues General Comments: likely baseline, lsoing attention during PT education and recs often      Exercises      General Comments        Pertinent Vitals/Pain Pain Assessment: Faces Faces Pain Scale: Hurts even more Pain Location: R hip down back of R thigh, Pain Descriptors / Indicators: Grimacing;Guarding;Discomfort;Sore;Tightness Pain Intervention(s): Limited activity within patient's tolerance;Monitored during session;Repositioned    Home Living                      Prior Function            PT Goals (current goals can now be found in the care plan section) Acute Rehab PT Goals Patient Stated Goal: return to work PT Goal Formulation: With patient Time For Goal Achievement: 02/17/21 Potential to Achieve Goals: Good Progress towards PT goals: Progressing toward goals    Frequency    Min 3X/week      PT Plan Discharge plan needs to be updated    Co-evaluation              AM-PAC PT "6 Clicks" Mobility   Outcome Measure  Help needed turning from your back to your side while in a flat bed without using bedrails?: None Help needed moving from lying on your back to sitting on the side of a flat bed without using bedrails?: None Help needed moving to and from a bed to a chair (including a wheelchair)?: A Little Help needed standing up from a chair using your arms (e.g., wheelchair or bedside chair)?: A Little Help needed to walk in hospital room?: A Little Help needed climbing 3-5 steps with a railing? : A Little 6 Click Score: 20    End of Session Equipment Utilized During Treatment: Gait belt Activity Tolerance: Patient limited by pain;Patient tolerated treatment well Patient left: in bed;with call bell/phone within reach Nurse Communication: Mobility status;Other (comment) (pt can  walk without assistance in halls) PT Visit Diagnosis: Unsteadiness on feet (R26.81);Other abnormalities of gait and mobility (R26.89);Difficulty in walking, not elsewhere classified (R26.2);Pain Pain - Right/Left: Right Pain - part of body: Hip     Time: 9604-5409 PT Time Calculation (min) (ACUTE ONLY): 27 min  Charges:  $Gait Training: 23-37 mins                     Raymond Gurney, PT, DPT Acute Rehabilitation Services  Pager: [phone] Office: [phone]    Jewel Baize 02/05/2021, 3:55 PM

## 2021-02-05 NOTE — Progress Notes (Signed)
Pt will need to be set up with walker and BSC before leaving tom, Thanks Glenna Fellows.

## 2021-02-05 NOTE — Progress Notes (Signed)
PROGRESS NOTE    STEPEN PRINS  YKD:[id number] DOB: 1970-11-25 DOA: 01/26/2021 PCP: Pcp, No    No chief complaint on file.   Brief Narrative:  50 yo AA male with hx of alcohol abuse, cocaine abuse, and THC use who presented after a fall.  Reportedly smelled of etoh and was slurring speech with EMS.  Initially hypotenesive, but this has improved.    He was admitted after a fall with AKI and rhabdomyolysis.  He had RLE numbness.  Imaging showed trauma to the right sided pelvic and hip musculature with extensive muscle tears and intramuscular and interfascial hemorrhage, edema and fluid.  Orthopedics saw him and noted no evidence of impending compartment syndrome, they suspect sensation should return with time.  General surgery was consulted in the setting of hemoperitoneum, they've now signed off.  Renal following with aki and rhabdomyolysis.   Assessment & Plan:   Principal Problem:   AKI (acute kidney injury) (HCC) Active Problems:   Elevated LFTs   Alcohol abuse   Polysubstance abuse (HCC)   Diarrhea   Traumatic rhabdomyolysis (HCC)   Elevated troponin  Hemoperitoneum/muscle tears and intramuscular and interfissural hemorrhage with edema and fluid: CT abd/pelvis concerning for trauma - perinephric stranding around R kidney with small volume hemorrhage within retroperitoneum, asymmetric, hypodense, and heterogeneous R gluteal musculature Appreciate surgery eval and assistance - now signed off  Worsened bruising 10/10, right flank into right thigh -> stop heparin - repeat CT -> low density throughout the right gluteal muscles, favor intramuscular hematoma, continued R perineprhic stranding and retroperitoneal stranding, improved since prior study - anasarca  10/10, appreciate surgery reeval - right upper leg hematoma, conservative management.  Pain relief.  Mobility.  Rhabdomyolysis  Acute Kidney Injury Related to cocaine use/trauma  CK gradually improving Off IV fluids. UOP  improved [place] without hydro  UA with protein, 6-10 RBC Nephrology following.  Renal functions gradually improving.  Right Lower Extremity Cellulitis This is likely inflammation not infection.  Discontinue antibiotics.    Hypertension Stable on amlodipine.  Anemia Hb relatively stable over past few days, downtrended overall from admission  Alcohol Abuse  CIWA protocol Encourage cessation Out of withdrawal window.  Elevated LFT's 2/2 etoh abuse and rhabdo, trend - improving RUQ [place] notable for sludge. Hepatitis panel negative.  Polysubstance abuse Encourage cessation  Mobilize.  Changed to oral pain medications for pain control.  Anticipate discharge home next 24 to 48 hours if renal functions stabilized.  DVT prophylaxis: SCD Code Status: full Family Communication: None. Disposition:   Status is: inpatient  Dispo: The patient is from: Home              Anticipated d/c is to:  pending possibly home with home health.              Patient currently is not medically stable to d/c.   Difficult to place patient No       Consultants:  Nephrology.  Procedures:  none  Antimicrobials:  Anti-infectives (From admission, onward)    Start     Dose/Rate Route Frequency Ordered Stop   02/04/21 1130  cephALEXin (KEFLEX) capsule 500 mg  Status:  Discontinued        500 mg Oral Every 12 hours 02/04/21 1108 02/05/21 1312   02/02/21 1130  ceFAZolin (ANCEF) IVPB 1 g/50 mL premix  Status:  Discontinued        1 g 100 mL/hr over 30 Minutes Intravenous Every 24 hours 02/02/21 1123 02/04/21 1108  Subjective: Patient seen and examined.  Still has significant pain on the right flank and right thigh otherwise feels overall better.  1500 urine output last 24 hours. Stopped orange juice.  He is taking Lokelma now.  Objective: Vitals:   02/04/21 1208 02/04/21 1955 02/05/21 0505 02/05/21 1110  BP: (!) 140/99 (!) 149/89 (!) 142/92 133/80  Pulse: 91 93 95 83  Resp: 18 20  18 18   Temp: 98.4 F (36.9 C) 98.1 F (36.7 C) 98.5 F (36.9 C) 98.3 F (36.8 C)  TempSrc: Oral Oral Oral Oral  SpO2: 100% 98% 94% 95%  Weight:   67.3 kg   Height:        Intake/Output Summary (Last 24 hours) at 02/05/2021 1312 Last data filed at 02/05/2021 0900 Gross per 24 hour  Intake 470.06 ml  Output 1100 ml  Net -629.94 ml   Filed Weights   02/03/21 0637 02/04/21 0332 02/05/21 0505  Weight: 71.8 kg 69.4 kg 67.3 kg    Examination: General: No acute distress. Cardiovascular: RRR. Lungs: unlabored Abdomen: Soft, nontender, nondistended  Neurological: Alert and oriented 3. Moves all extremities 4 . Cranial nerves II through XII grossly intact. Extremities:  He has some tenderness and fullness on the right flank along with swelling of the right thigh.  Distal neurovascular status intact.  Tender but no evidence of erythema or cellulitis.     Data Reviewed: I have personally reviewed following labs and imaging studies  CBC: Recent Labs  Lab 01/31/21 0219 02/01/21 0410 02/02/21 0254 02/03/21 0258  WBC 10.8* 11.2* 16.7* 14.5*  NEUTROABS 7.6 7.6 12.3* 9.5*  HGB 9.6* 9.9* 9.9* 9.5*  HCT 27.1* 27.4* 27.4* 27.3*  MCV 95.8 93.8 94.5 96.5  PLT 246 283 333 373    Basic Metabolic Panel: Recent Labs  Lab 01/30/21 0307 01/31/21 0219 02/01/21 0410 02/02/21 0254 02/03/21 0258 02/04/21 0131 02/04/21 0643 02/05/21 0356  NA 130*   < > 134* 133* 134* 135  --  136  K 3.9   < > 4.4 4.7 5.1 5.8* 5.5* 4.8  CL 106   < > 106 104 105 102  --  101  CO2 14*   < > 17* 21* 21* 26  --  27  GLUCOSE 93   < > 103* 111* 103* 103*  --  119*  BUN 67*   < > 73* 73* 71* 66*  --  56*  CREATININE 9.19*   < > 10.38* 9.88* 8.78* 7.11*  --  6.03*  CALCIUM 8.0*   < > 8.8* 8.7* 8.7* 9.1  --  8.8*  MG 1.8  --  1.9 1.8 1.9  --   --   --   PHOS 4.7*  --  6.4* 5.3* 5.3* 4.6  --  5.4*   < > = values in this interval not displayed.    GFR: Estimated Creatinine Clearance: 13.6 mL/min (A)  (by C-G formula based on SCr of 6.03 mg/dL (H)).  Liver Function Tests: Recent Labs  Lab 01/30/21 0307 01/31/21 0219 02/01/21 0410 02/02/21 0254 02/03/21 0258 02/04/21 0131 02/05/21 0356  AST 566* 378* 247* 147* 87*  --   --   ALT 313* 266* 238* 190* 141*  --   --   ALKPHOS 50 49 50 49 54  --   --   BILITOT 1.0 0.8 0.9 0.6 0.6  --   --   PROT 4.9* 4.8* 5.1* 5.2* 4.8*  --   --   ALBUMIN 2.3*  2.3*  2.2* 2.4* 2.4* 2.3* 2.8* 2.4*    CBG: No results for input(s): GLUCAP in the last 168 hours.   Recent Results (from the past 240 hour(s))  Resp Panel by RT-PCR (Flu A&B, Covid) Nasopharyngeal Swab     Status: None   Collection Time: 01/26/21  4:06 PM   Specimen: Nasopharyngeal Swab; Nasopharyngeal(NP) swabs in vial transport medium  Result Value Ref Range Status   SARS Coronavirus 2 by RT PCR NEGATIVE NEGATIVE Final    Comment: (NOTE) SARS-CoV-2 target nucleic acids are NOT DETECTED.  The SARS-CoV-2 RNA is generally detectable in upper respiratory specimens during the acute phase of infection. The lowest concentration of SARS-CoV-2 viral copies this assay can detect is 138 copies/mL. A negative result does not preclude SARS-Cov-2 infection and should not be used as the sole basis for treatment or other patient management decisions. A negative result may occur with  improper specimen collection/handling, submission of specimen other than nasopharyngeal swab, presence of viral mutation(s) within the areas targeted by this assay, and inadequate number of viral copies(<138 copies/mL). A negative result must be combined with clinical observations, patient history, and epidemiological information. The expected result is Negative.  Fact Sheet for Patients:  BloggerCourse.com  Fact Sheet for Healthcare Providers:  SeriousBroker.it  This test is no t yet approved or cleared by the [place] FDA and  has been authorized for  detection and/or diagnosis of SARS-CoV-2 by FDA under an Emergency Use Authorization (EUA). This EUA will remain  in effect (meaning this test can be used) for the duration of the COVID-19 declaration under Section 564(b)(1) of the Act, 21 U.S.C.section 360bbb-3(b)(1), unless the authorization is terminated  or revoked sooner.       Influenza A by PCR NEGATIVE NEGATIVE Final   Influenza B by PCR NEGATIVE NEGATIVE Final    Comment: (NOTE) The Xpert Xpress SARS-CoV-2/FLU/RSV plus assay is intended as an aid in the diagnosis of influenza from Nasopharyngeal swab specimens and should not be used as a sole basis for treatment. Nasal washings and aspirates are unacceptable for Xpert Xpress SARS-CoV-2/FLU/RSV testing.  Fact Sheet for Patients: BloggerCourse.com  Fact Sheet for Healthcare Providers: SeriousBroker.it  This test is not yet approved or cleared by the [place] FDA and has been authorized for detection and/or diagnosis of SARS-CoV-2 by FDA under an Emergency Use Authorization (EUA). This EUA will remain in effect (meaning this test can be used) for the duration of the COVID-19 declaration under Section 564(b)(1) of the Act, 21 U.S.C. section 360bbb-3(b)(1), unless the authorization is terminated or revoked.  Performed at [facility], [address], [place], [place] [address]   Urine Culture     Status: None   Collection Time: 01/27/21  7:33 PM   Specimen: Urine, Clean Catch  Result Value Ref Range Status   Specimen Description URINE, CLEAN CATCH  Final   Special Requests NONE  Final   Culture   Final    NO GROWTH Performed at [facility], [address] [place], [place] [address]    Report Status 01/29/2021 FINAL  Final  Gastrointestinal Panel by PCR , Stool     Status: None   Collection Time: 01/28/21  3:46 PM   Specimen: Stool  Result Value Ref Range Status   Campylobacter species NOT  DETECTED NOT DETECTED Final   Plesimonas shigelloides NOT DETECTED NOT DETECTED Final   Salmonella species NOT DETECTED NOT DETECTED Final   Yersinia enterocolitica NOT DETECTED NOT DETECTED  Final   Vibrio species NOT DETECTED NOT DETECTED Final   Vibrio cholerae NOT DETECTED NOT DETECTED Final   Enteroaggregative E coli (EAEC) NOT DETECTED NOT DETECTED Final   Enteropathogenic E coli (EPEC) NOT DETECTED NOT DETECTED Final   Enterotoxigenic E coli (ETEC) NOT DETECTED NOT DETECTED Final   Shiga like toxin producing E coli (STEC) NOT DETECTED NOT DETECTED Final   Shigella/Enteroinvasive E coli (EIEC) NOT DETECTED NOT DETECTED Final   Cryptosporidium NOT DETECTED NOT DETECTED Final   Cyclospora cayetanensis NOT DETECTED NOT DETECTED Final   Entamoeba histolytica NOT DETECTED NOT DETECTED Final   Giardia lamblia NOT DETECTED NOT DETECTED Final   Adenovirus F40/41 NOT DETECTED NOT DETECTED Final   Astrovirus NOT DETECTED NOT DETECTED Final   Norovirus GI/GII NOT DETECTED NOT DETECTED Final   Rotavirus A NOT DETECTED NOT DETECTED Final   Sapovirus (I, II, IV, and V) NOT DETECTED NOT DETECTED Final    Comment: Performed at [facility], [address], [place], [place] [address]         Radiology Studies: No results found.      Scheduled Meds:  amLODipine  5 mg Oral Daily   folic acid  1 mg Oral Daily   multivitamin with minerals  1 tablet Oral Daily   polyethylene glycol  17 g Oral BID   sodium bicarbonate  1,300 mg Oral BID   thiamine  100 mg Oral Daily   Continuous Infusions:     LOS: 9 days    Time spent: 34 minutes    Dorcas Carrow, MD [facility]   02/05/2021, 1:12 PM

## 2021-02-05 NOTE — TOC Progression Note (Signed)
Transition of Care [facility]) - Progression Note    Patient Details  Name: Frank [name] MRN: [id number] Date of Birth: [birth year]  Transition of Care [facility]) CM/SW Contact  Leone Haven, RN Phone Number: 02/05/2021, 9:08 PM  Clinical Narrative:    From home wife in boarding house, fall. + hypontension, AKI, rhabdo, R LE cellulitis.  Address on file is for [facility], need to change to his address. Judeth Cornfield NCM did Match letter in notes which will expire on 10/16, will need to change the date on the Match letter to help ast with medications.  TOC will continue to follow for dc needs.        Expected Discharge Plan and Services                                                 Social Determinants of Health (SDOH) Interventions    Readmission Risk Interventions No flowsheet data found.

## 2021-02-06 LAB — RENAL FUNCTION PANEL
Albumin: 2.4 g/dL — ABNORMAL LOW (ref 3.5–5.0)
Anion gap: 8 (ref 5–15)
BUN: 49 mg/dL — ABNORMAL HIGH (ref 6–20)
CO2: 27 mmol/L (ref 22–32)
Calcium: 8.7 mg/dL — ABNORMAL LOW (ref 8.9–10.3)
Chloride: 102 mmol/L (ref 98–111)
Creatinine, Ser: 4.76 mg/dL — ABNORMAL HIGH (ref 0.61–1.24)
GFR, Estimated: 14 mL/min — ABNORMAL LOW (ref >60)
Glucose, Bld: 87 mg/dL (ref 70–99)
Phosphorus: 5.2 mg/dL — ABNORMAL HIGH (ref 2.5–4.6)
Potassium: 5.2 mmol/L — ABNORMAL HIGH (ref 3.5–5.1)
Sodium: 137 mmol/L (ref 135–145)

## 2021-02-06 MED ORDER — NICOTINE 21 MG/24HR TD PT24: 21.0000 mg | MEDICATED_PATCH | TRANSDERMAL | 0 refills | Status: DC | PRN

## 2021-02-06 MED ORDER — CYCLOBENZAPRINE HCL 5 MG PO TABS: 5.0000 mg | ORAL_TABLET | Freq: Three times a day (TID) | ORAL | 0 refills | Status: DC | PRN

## 2021-02-06 MED ORDER — AMLODIPINE BESYLATE 5 MG PO TABS: 5.0000 mg | ORAL_TABLET | Freq: Every day | ORAL | 0 refills | Status: DC

## 2021-02-06 MED ORDER — SODIUM ZIRCONIUM CYCLOSILICATE 10 G PO PACK
10.0000 g | PACK | Freq: Once | ORAL | Status: AC
  Administered 2021-02-06: 10 g via ORAL
  Filled 2021-02-06: qty 1

## 2021-02-06 MED ORDER — ALBUTEROL SULFATE HFA 108 (90 BASE) MCG/ACT IN AERS: 2.0000 | INHALATION_SPRAY | RESPIRATORY_TRACT | 2 refills | Status: DC | PRN

## 2021-02-06 MED ORDER — CYCLOBENZAPRINE HCL 5 MG PO TABS: 5.0000 mg | ORAL_TABLET | Freq: Three times a day (TID) | ORAL | 0 refills | Status: AC | PRN

## 2021-02-06 MED ORDER — CYCLOBENZAPRINE HCL 5 MG PO TABS
5.0000 mg | ORAL_TABLET | Freq: Three times a day (TID) | ORAL | Status: DC | PRN
  Administered 2021-02-06: 5 mg via ORAL
  Filled 2021-02-06: qty 1

## 2021-02-06 MED ORDER — OXYCODONE HCL 5 MG PO TABS: 5.0000 mg | ORAL_TABLET | Freq: Four times a day (QID) | ORAL | 0 refills | Status: DC | PRN

## 2021-02-06 NOTE — Discharge Summary (Signed)
Physician Discharge Summary  Frank [name] ERD:[id number] DOB: 06/06/1970 DOA: 01/26/2021  PCP: Oneita Hurt, No  Admit date: 01/26/2021 Discharge date: 02/06/2021  Admitted From: Home Disposition: Home  Recommendations for Outpatient Follow-up:  Follow up with PCP in 1-2 weeks Please obtain BMP/CBC in one week Nephrology will schedule follow-up  Home Health: Not applicable Equipment/Devices: 3 in 1, rolling walker  Discharge Condition: Stable CODE STATUS: Full code Diet recommendation: Low-salt diet.  No drugs.  No alcohol.  Discharge summary:  50 year old gentleman with history of alcohol abuse and cocaine abuse who was intoxicated, he tried to jump over 8 feet fence, got entangled and fell on the concrete.  He was found to be intoxicated, slurred speech and hypotensive and was brought to the ER.  He was found with acute kidney injury and rhabdomyolysis, right lower lower extremity tingling numbness and swelling of the right flank and right thigh.  Rhabdomyolysis with acute kidney injury: Presented with creatinine more than 12.  He was treated with IV fluids initially and then monitored.  Gradually improving.  Urine output is adequate.  Renal functions gradually improving and normalizing.  Seen and followed by nephrology. He will go home, continue to keep up with oral hydration.  He will avoid all nephrotoxins as discussed.  He will avoid drugs and alcohol.  He will have recheck BMP in 1 week.  Nephrology will schedule follow-up.  Hemoperitoneum/multiple muscle tears and intramuscular and interfissural hemorrhage with edema and fluid: Gradually improving.  Was seen by CT surgery and orthopedics who recommended conservative management.  Pain and swelling has much improved.  He is using oral pain medication.  Will discharge on short course of oxycodone and Flexeril.  He was advised to avoid heavy exertion and was advised to avoid going back to physical work for next week.  Chronic intermittent  asthma: As needed bronchodilator.  Patient is adequately stabilized.  Liver function test has normalized.  Renal functions are adequate and normalizing.  Able to go home today.   Discharge Diagnoses:  Principal Problem:   AKI (acute kidney injury) (HCC) Active Problems:   Elevated LFTs   Alcohol abuse   Polysubstance abuse (HCC)   Diarrhea   Traumatic rhabdomyolysis (HCC)   Elevated troponin    Discharge Instructions  Discharge Instructions     Call MD for:  extreme fatigue   Complete by: As directed    Call MD for:  severe uncontrolled pain   Complete by: As directed    Diet - low sodium heart healthy   Complete by: As directed    Increase activity slowly   Complete by: As directed       Allergies as of 02/06/2021       Reactions   Other Hives, Itching   Allergic to white chocolate        Medication List     STOP taking these medications    azithromycin 250 MG tablet Commonly known as: Zithromax Z-Pak   naproxen 500 MG tablet Commonly known as: NAPROSYN   predniSONE 20 MG tablet Commonly known as: DELTASONE       TAKE these medications    albuterol 108 (90 Base) MCG/ACT inhaler Commonly known as: VENTOLIN HFA Inhale 2 puffs into the lungs every 4 (four) hours as needed for wheezing or shortness of breath. What changed: Another medication with the same name was removed. Continue taking this medication, and follow the directions you see here.   amLODipine 5 MG tablet Commonly known as: NORVASC  Take 1 tablet (5 mg total) by mouth daily.   cyclobenzaprine 5 MG tablet Commonly known as: FLEXERIL Take 1 tablet (5 mg total) by mouth 3 (three) times daily as needed for up to 7 days for muscle spasms.   nicotine 21 mg/24hr patch Commonly known as: NICODERM CQ - dosed in mg/24 hours Place 1 patch (21 mg total) onto the skin as needed (nicotine withdrawal).   oxyCODONE 5 MG immediate release tablet Commonly known as: Oxy IR/ROXICODONE Take 1  tablet (5 mg total) by mouth every 6 (six) hours as needed for moderate pain or breakthrough pain.               Durable Medical Equipment  (From admission, onward)           Start     Ordered   02/06/21 1012  For home use only DME 3 n 1  Once        02/06/21 1012   02/06/21 1011  For home use only DME Walker rolling  Once       Question Answer Comment  Walker: With 5 Inch Wheels   Patient needs a walker to treat with the following condition Weakness      02/06/21 1011            Follow-up Information     [facility] Follow up.   Why: please make you a follow up apt Contact information: [address] [facility] [id number] [phone]               Allergies  Allergen Reactions   Other Hives and Itching    Allergic to white chocolate    Consultations: General surgery Orthopedics Nephrology   Procedures/Studies: CT ABDOMEN PELVIS WO CONTRAST  Result Date: 02/01/2021 CLINICAL DATA:  Retroperitoneal hematoma suspected hemoperitoneum EXAM: CT ABDOMEN AND PELVIS WITHOUT CONTRAST TECHNIQUE: Multidetector CT imaging of the abdomen and pelvis was performed following the standard protocol without IV contrast. COMPARISON:  01/27/2021 FINDINGS: Lower chest: Moderate bilateral pleural effusions, new since prior study. Bibasilar atelectasis. Hepatobiliary: No focal hepatic abnormality. Gallbladder unremarkable. Pancreas: No focal abnormality or ductal dilatation. Spleen: No focal abnormality.  Normal size. Adrenals/Urinary Tract: Previously seen right perinephric stranding again noted, slightly decreased. Fluid continues inferiorly in the right retroperitoneum, also improved. No visible renal or adrenal mass. No hydronephrosis. Urinary bladder unremarkable. Stomach/Bowel: Stomach, large and small bowel grossly unremarkable. Vascular/Lymphatic: Scattered aortic atherosclerosis. No evidence of aneurysm or adenopathy.  Reproductive: No visible focal abnormality. Other: Stranding noted in the perirectal space. No visible free intraperitoneal fluid or air. Stranding noted throughout the subcutaneous soft tissues diffusely suggesting anasarca. Musculoskeletal: No acute bony abnormality. IMPRESSION: Continued low-density throughout the right gluteal muscles, favor intramuscular hematoma. Continued right perinephric stranding and retroperitoneal stranding, improving since prior study. This could reflect resolving retroperitoneal hematoma. No visible ascites or hemoperitoneum currently. Anasarca like edema throughout the subcutaneous soft tissues. Electronically Signed   By: Charlett Nose M.D.   On: 02/01/2021 12:31   CT ABDOMEN WO CONTRAST  Addendum Date: 01/27/2021   ADDENDUM REPORT: 01/27/2021 23:26 ADDENDUM: These results were called by telephone at the time of interpretation on 01/27/2021 at 11:23 pm to provider Dr. Rachael Darby, who verbally acknowledged these results. Electronically Signed   By: Tish Frederickson M.D.   On: 01/27/2021 23:26   Result Date: 01/27/2021 CLINICAL DATA:  Hemoperitoneum. stumble at gas station and fall into bushes, EXAM: CT ABDOMEN AND PELVS WITHOUT CONTRAST  TECHNIQUE: Multidetector CT imaging of the abdomen was performed following the standard protocol without IV contrast. COMPARISON:  X-ray chest 01/26/2021, CT pelvis 01/27/2021 FINDINGS: Lower abdomen: No acute abnormality. Liver: Not enlarged. No focal lesion. Biliary System: The gallbladder is otherwise unremarkable with no radio-opaque gallstones. No biliary ductal dilatation. Pancreas: Normal pancreatic contour. No main pancreatic duct dilatation. Spleen: Not enlarged. No focal lesion. Adrenal Glands: No nodularity bilaterally. Kidneys: Marked perinephric stranding surrounding the right kidney with free fluid coursing inferiorly along the right pericolic gutter. No hydroureteronephrosis. No nephroureterolithiasis. No contour deforming renal mass.  The urinary bladder is unremarkable. Bowel: No small or large bowel wall thickening or dilatation. The appendix is unremarkable. Mesentery, Omentum, and Peritoneum: High density trace to small volume abdominal and pelvic free fluid. Fat stranding noted within the left upper quadrant and surrounding the left kidney. No pneumoperitoneum. No mesenteric hematoma identified. No organized fluid collection. Pelvic Organs: Normal. Lymph Nodes: No abdominal, pelvic, inguinal lymphadenopathy. Vasculature: Atherosclerotic plaque. No abdominal aorta or iliac aneurysm. Musculoskeletal: Markedly asymmetric, hypodense, and heterogeneous right gluteal musculature compared to the left. Associated overlying subcutaneus soft tissue edema. Tiny fat containing umbilical hernia. No suspicious lytic or blastic osseous lesions. No acute displaced fracture. Multilevel degenerative changes of the spine. IMPRESSION: 1. Marked perinephric stranding surrounding the right kidney with at least small volume hemorrhage within the retroperitoneum. Findings concerning for trauma. Superimposed infection not excluded. Correlate with urinalysis. 2. Markedly asymmetric, hypodense, and heterogeneous right gluteal musculature compared to the left. Findings suggestive of an underlying hematoma formation. Mass is not excluded. 3. Fat stranding noted within the left upper quadrant and surrounding the left kidney. 4.  Aortic Atherosclerosis (ICD10-I70.0). 5. Markedly limited evaluation on this noncontrast study. Recommend repeat trauma protocol with intravenous contrast (including a delayed phase of the abdomen and pelvis). Electronically Signed: By: Tish Frederickson M.D. On: 01/27/2021 23:09   DG Ribs Unilateral W/Chest Right  Result Date: 01/26/2021 CLINICAL DATA:  Status post fall. EXAM: RIGHT RIBS AND CHEST - 3+ VIEW COMPARISON:  None. FINDINGS: No acute displaced right rib fracture or other bone lesions are seen involving the ribs. The heart and  mediastinal contours are within normal limits. No focal consolidation. No pulmonary edema. No pleural effusion. No pneumothorax. No acute osseous abnormality. IMPRESSION: 1. No acute displaced right rib fracture. Please note, nondisplaced rib fractures may be occult on radiograph. 2. No acute cardiopulmonary abnormality. Electronically Signed   By: Tish Frederickson M.D.   On: 01/26/2021 17:15   DG Ankle Complete Left  Result Date: 01/26/2021 CLINICAL DATA:  Chills and cough.  Status post fall. EXAM: LEFT ANKLE COMPLETE - 3+ VIEW COMPARISON:  None. FINDINGS: There is no evidence of fracture, dislocation, or joint effusion. There is no evidence of arthropathy or other focal bone abnormality. Soft tissues are unremarkable. IMPRESSION: Negative. Electronically Signed   By: Signa Kell M.D.   On: 01/26/2021 17:10   DG Ankle Complete Right  Result Date: 01/26/2021 CLINICAL DATA:  Chills and cough, fall. EXAM: RIGHT ANKLE - COMPLETE 3+ VIEW COMPARISON:  None. FINDINGS: There is no evidence of fracture, dislocation, or joint effusion. There is no evidence of arthropathy or other focal bone abnormality. Soft tissues are unremarkable. IMPRESSION: Negative. Electronically Signed   By: Darliss Cheney M.D.   On: 01/26/2021 17:09   CT HEAD WO CONTRAST ( )  Result Date: 01/26/2021 CLINICAL DATA:  Head trauma, mod-severe; Neck trauma, intoxicated or obtunded (Age >= 16y). EtOH on board, syncope/fall and hit  head. EXAM: CT HEAD WITHOUT CONTRAST CT CERVICAL SPINE WITHOUT CONTRAST TECHNIQUE: Multidetector CT imaging of the head and cervical spine was performed following the standard protocol without intravenous contrast. Multiplanar CT image reconstructions of the cervical spine were also generated. COMPARISON:  09/03/2020 FINDINGS: CT HEAD FINDINGS Brain: There is no evidence of an acute infarct, intracranial hemorrhage, mass, midline shift, or extra-axial fluid collection. The ventricles and sulci are normal.  Vascular: No hyperdense vessel. Skull: No fracture or suspicious osseous lesion. Sinuses/Orbits: Visualized paranasal sinuses and mastoid air cells are clear. Unremarkable orbits. Other: None. CT CERVICAL SPINE FINDINGS Alignment: Straightening of the normal cervical lordosis. Chronic trace anterolisthesis of C4 on C5. Skull base and vertebrae: No acute fracture or suspicious osseous lesion. Soft tissues and spinal canal: No prevertebral fluid or swelling. No visible canal hematoma. Disc levels: Similar appearance of multilevel disc degeneration, greatest at C5-6 and C6-7 with uncovertebral spurring resulting in moderate bilateral neural foraminal stenosis. Upper chest: Clear lung apices. Other: None. IMPRESSION: 1. No evidence of acute intracranial abnormality. 2. No acute cervical spine fracture. Electronically Signed   By: Sebastian Ache M.D.   On: 01/26/2021 17:50   CT Cervical Spine Wo Contrast  Result Date: 01/26/2021 CLINICAL DATA:  Head trauma, mod-severe; Neck trauma, intoxicated or obtunded (Age >= 16y). EtOH on board, syncope/fall and hit head. EXAM: CT HEAD WITHOUT CONTRAST CT CERVICAL SPINE WITHOUT CONTRAST TECHNIQUE: Multidetector CT imaging of the head and cervical spine was performed following the standard protocol without intravenous contrast. Multiplanar CT image reconstructions of the cervical spine were also generated. COMPARISON:  09/03/2020 FINDINGS: CT HEAD FINDINGS Brain: There is no evidence of an acute infarct, intracranial hemorrhage, mass, midline shift, or extra-axial fluid collection. The ventricles and sulci are normal. Vascular: No hyperdense vessel. Skull: No fracture or suspicious osseous lesion. Sinuses/Orbits: Visualized paranasal sinuses and mastoid air cells are clear. Unremarkable orbits. Other: None. CT CERVICAL SPINE FINDINGS Alignment: Straightening of the normal cervical lordosis. Chronic trace anterolisthesis of C4 on C5. Skull base and vertebrae: No acute fracture or  suspicious osseous lesion. Soft tissues and spinal canal: No prevertebral fluid or swelling. No visible canal hematoma. Disc levels: Similar appearance of multilevel disc degeneration, greatest at C5-6 and C6-7 with uncovertebral spurring resulting in moderate bilateral neural foraminal stenosis. Upper chest: Clear lung apices. Other: None. IMPRESSION: 1. No evidence of acute intracranial abnormality. 2. No acute cervical spine fracture. Electronically Signed   By: Sebastian Ache M.D.   On: 01/26/2021 17:50   CT PELVIS WO CONTRAST  Result Date: 01/27/2021 CLINICAL DATA:  Pelvic trauma, right lower extremity numbness, right hip pain EXAM: CT PELVIS WITHOUT CONTRAST TECHNIQUE: Multidetector CT imaging of the pelvis was performed following the standard protocol without intravenous contrast. COMPARISON:  Pelvis radiographs 12/21/2013 FINDINGS: Urinary Tract:  No abnormality visualized. Bowel: There is no abnormal bowel wall thickening or inflammatory change the level imaged. Vascular/Lymphatic: The vasculature is unremarkable within the confines of noncontrast technique. There is no lymphadenopathy in the pelvis. Reproductive:  The prostate and seminal vesicles are unremarkable. Other: There is blood in the presacral space and right pericolic gutter. There is no free air to the level imaged. Musculoskeletal: There is an intramuscular hematoma in the right gluteus musculature with musculotendinous injury with probable involvement of at least the right gluteus medius and piriformis muscles. No fracture is identified. IMPRESSION: 1. Hemoperitoneum, incompletely evaluated. No free air is seen in the pelvis. Recommend CT of the abdomen and pelvis with IV contrast  for further evaluation. 2. S on the right uspected musculotendinous injuries with probable involvement of at least the right gluteus medius and piriformis muscles. 3. No fracture is identified. Electronically Signed   By: Lesia Hausen M.D.   On: 01/27/2021 14:01    MR BRAIN WO CONTRAST  Result Date: 01/27/2021 CLINICAL DATA:  Neuro deficit, stroke suspected EXAM: MRI HEAD WITHOUT CONTRAST TECHNIQUE: Multiplanar, multiecho pulse sequences of the brain and surrounding structures were obtained without intravenous contrast. COMPARISON:  No prior MRI correlation is made with 01/26/2021 CT brain. FINDINGS: Brain: Evaluation somewhat limited by motion artifact. No acute infarction, hemorrhage, hydrocephalus, mass, mass effect, or midline shift. Scattered T2 hyperintense signal in the periventricular white matter, likely the sequela of chronic small vessel ischemic disease. Vascular: Normal flow voids. Skull and upper cervical spine: Normal marrow signal. Sinuses/Orbits: Imaged sinuses are clear. The orbits are unremarkable. Other: Trace fluid in mastoid air cells. IMPRESSION: No acute intracranial process. Electronically Signed   By: Wiliam Ke M.D.   On: 01/27/2021 12:23   MR PELVIS WO CONTRAST  Result Date: 01/27/2021 CLINICAL DATA:  Unknown trauma.  Found down.  Abnormal CT scan. EXAM: MRI PELVIS WITHOUT CONTRAST, MRI RIGHT FEMUR WITHOUT CONTRAST TECHNIQUE: Multiplanar multisequence MR imaging of the pelvis was performed. No intravenous contrast was administered. COMPARISON:  CT scan, same date. FINDINGS: Significant trauma involving the right sided pelvic and hip musculature with marked increased T2 signal intensity most notably in the right gluteal muscles. Large intramuscular hematoma in the gluteus minimus muscle. Grade 2 tearing of the gluteus maximus muscle inferiorly along the musculotendinous junction. High-grade partial tearing of the quadratus femoris muscle and the external portion of the obturator internus muscle. Both hips are normally located. No hip or pelvic fractures are identified. The pubic symphysis and SI joints are intact. The hamstring tendons are intact. Free fluid noted in the lower abdomen and pelvis but no discrete intrapelvic hematoma.  Bladder is grossly normal. Prostate gland and seminal vesicles are unremarkable. IMPRESSION: 1. Significant trauma involving the right sided pelvic and hip musculature with extensive muscle tears and intramuscular and interfascial hemorrhage, edema and fluid. 2. No fractures are identified. Electronically Signed   By: Rudie Meyer M.D.   On: 01/27/2021 20:16   US RENAL  Result Date: 01/26/2021 CLINICAL DATA:  Acute kidney injury EXAM: RENAL / URINARY TRACT ULTRASOUND COMPLETE COMPARISON:  None. FINDINGS: Right Kidney: Renal measurements: 10.4 x 4.6 x 4.2 cm = volume: 105.3 mL. Cortex appears echogenic. No mass or hydronephrosis. Left Kidney: Renal measurements: 8.8 x 5.1 x 3.5 cm = volume: 81.3 mL. Cortex appears slightly echogenic. No mass or hydronephrosis Bladder: Debris in the urinary bladder Other: None. IMPRESSION: 1. Kidneys appear slightly echogenic suggesting medical renal disease. No hydronephrosis 2. Debris in the urinary bladder Electronically Signed   By: Jasmine Pang M.D.   On: 01/26/2021 23:30   CT FEMUR RIGHT WO CONTRAST  Result Date: 02/01/2021 CLINICAL DATA:  Right leg pain and bruising. Rhabdomyolysis with down-trending creatinine kinase levels EXAM: CT OF THE LOWER RIGHT EXTREMITY WITHOUT CONTRAST TECHNIQUE: Multidetector CT imaging of the right lower extremity was performed according to the standard protocol. COMPARISON:  Right hip x-ray 01/26/2021.  Right femur MRI 01/27/2021 FINDINGS: Bones/Joint/Cartilage No acute fracture. No dislocation. Joint spaces of the right hip and right knee are maintained. No appreciable hip or knee joint effusion. The included portion of the right hemipelvis is intact. No right SI joint or pubic symphysis diastasis. No suspicious  lytic or sclerotic bone lesion. No erosion. No periosteal elevation. Ligaments Suboptimally assessed by CT. Muscles and Tendons Relatively decreased attenuation of the right gluteus medius and gluteus minimus muscles as well as  the right quadratus femoris muscle. No intramuscular fluid collection is evident by CT. No acute tendinous abnormality is evident. Soft tissues Extensive circumferential subcutaneous edema throughout the right thigh and visualized right pelvis. There is also edema and fluid tracking within the fascial compartments of the right thigh. No organized fluid collections. No soft tissue gas. IMPRESSION: 1. Extensive circumferential subcutaneous edema throughout the right thigh and visualized right pelvis with edema and fluid tracking within the fascial compartments of the right thigh. No organized fluid collections or soft tissue gas. Findings are nonspecific and can be seen in the setting of cellulitis and myofasciitis. 2. Relatively decreased attenuation of the right gluteus medius and gluteus minimus muscles as well as the right quadratus femoris muscle. Findings likely related to patient's known rhabdomyolysis. No evidence of myonecrosis by CT. 3. No acute osseous abnormality. Electronically Signed   By: Duanne Guess D.O.   On: 02/01/2021 15:06   MR ZOXWR RIGHT WO CONTRAST  Result Date: 01/27/2021 CLINICAL DATA:  Unknown trauma.  Found down.  Abnormal CT scan. EXAM: MRI PELVIS WITHOUT CONTRAST, MRI RIGHT FEMUR WITHOUT CONTRAST TECHNIQUE: Multiplanar multisequence MR imaging of the pelvis was performed. No intravenous contrast was administered. COMPARISON:  CT scan, same date. FINDINGS: Significant trauma involving the right sided pelvic and hip musculature with marked increased T2 signal intensity most notably in the right gluteal muscles. Large intramuscular hematoma in the gluteus minimus muscle. Grade 2 tearing of the gluteus maximus muscle inferiorly along the musculotendinous junction. High-grade partial tearing of the quadratus femoris muscle and the external portion of the obturator internus muscle. Both hips are normally located. No hip or pelvic fractures are identified. The pubic symphysis and SI  joints are intact. The hamstring tendons are intact. Free fluid noted in the lower abdomen and pelvis but no discrete intrapelvic hematoma. Bladder is grossly normal. Prostate gland and seminal vesicles are unremarkable. IMPRESSION: 1. Significant trauma involving the right sided pelvic and hip musculature with extensive muscle tears and intramuscular and interfascial hemorrhage, edema and fluid. 2. No fractures are identified. Electronically Signed   By: Rudie Meyer M.D.   On: 01/27/2021 20:16   DG Hip Unilat W or Wo Pelvis 2-3 Views Right  Result Date: 01/26/2021 CLINICAL DATA:  Status post fall. EXAM: DG HIP (WITH OR WITHOUT PELVIS) 2-3V RIGHT COMPARISON:  01/29/2009 FINDINGS: There is no evidence of hip fracture or dislocation. There is no evidence of arthropathy or other focal bone abnormality. IMPRESSION: Negative. Electronically Signed   By: Signa Kell M.D.   On: 01/26/2021 17:08   VAS [place] LOWER EXTREMITY VENOUS (DVT)  Result Date: 01/27/2021  Lower Venous DVT Study Patient Name:  CARRELL RAHMANI  Date of Exam:   01/26/2021 Medical Rec #: [id number]        Accession #:    [id number] Date of Birth: 1971/01/05        Patient Gender: M Patient Age:   89 years Exam Location:  [facility] Procedure:      VAS [place] LOWER EXTREMITY VENOUS (DVT) Referring Phys: Lynden Oxford --------------------------------------------------------------------------------  Indications: Pain s/p fall.  Comparison Study: No prior studies. Performing Technologist: Jean Rosenthal RDMS, RVT  Examination Guidelines: A complete evaluation includes B-mode imaging, spectral Doppler, color Doppler, and power Doppler as needed of all  accessible portions of each vessel. Bilateral testing is considered an integral part of a complete examination. Limited examinations for reoccurring indications may be performed as noted. The reflux portion of the exam is performed with the patient in reverse Trendelenburg.   +---------+---------------+---------+-----------+----------+--------------+ RIGHT    CompressibilityPhasicitySpontaneityPropertiesThrombus Aging +---------+---------------+---------+-----------+----------+--------------+ CFV      Full           Yes      Yes                                 +---------+---------------+---------+-----------+----------+--------------+ SFJ      Full                                                        +---------+---------------+---------+-----------+----------+--------------+ FV Prox  Full                                                        +---------+---------------+---------+-----------+----------+--------------+ FV Mid   Full                                                        +---------+---------------+---------+-----------+----------+--------------+ FV DistalFull                                                        +---------+---------------+---------+-----------+----------+--------------+ PFV      Full                                                        +---------+---------------+---------+-----------+----------+--------------+ POP      Full           Yes      Yes                                 +---------+---------------+---------+-----------+----------+--------------+ PTV      Full                                                        +---------+---------------+---------+-----------+----------+--------------+ PERO     Full                                                        +---------+---------------+---------+-----------+----------+--------------+ Gastroc  Full                                                        +---------+---------------+---------+-----------+----------+--------------+  Summary: RIGHT: - There is no evidence of deep vein thrombosis in the lower extremity.  - No cystic structure found in the popliteal fossa.   *See table(s) above for measurements and observations.  Electronically signed by Waverly Ferrari MD on 01/27/2021 at 6:44:38 AM.    Final    US Abdomen Limited RUQ (LIVER/GB)  Result Date: 01/26/2021 CLINICAL DATA:  Right upper quadrant pain. EXAM: ULTRASOUND ABDOMEN LIMITED RIGHT UPPER QUADRANT COMPARISON:  CT chest abdomen and pelvis 04/04/2009 report only. FINDINGS: Gallbladder: No gallstones or wall thickening visualized. Gallbladder sludge is present. No sonographic Murphy sign noted by sonographer. Common bile duct: Diameter: 3.7 mm. Liver: No focal lesion identified. Within normal limits in parenchymal echogenicity. Portal vein is patent on color Doppler imaging with normal direction of blood flow towards the liver. Other: None. IMPRESSION: 1. Gallbladder sludge. No additional sonographic evidence for cholelithiasis or acute cholecystitis. Electronically Signed   By: Darliss Cheney M.D.   On: 01/26/2021 18:55   (Echo, Carotid, EGD, Colonoscopy, ERCP)    Subjective: Patient seen and examined.  He has mild pain right lateral thigh.  Swelling has much improved.  Denies any nausea vomiting.  Excited to go home.   Discharge Exam: Vitals:   02/05/21 2015 02/06/21 0515  BP: 122/82 (!) 148/98  Pulse: 81 85  Resp:  16  Temp: 98.1 F (36.7 C) 98.3 F (36.8 C)  SpO2: 96% 96%   Vitals:   02/05/21 1110 02/05/21 2015 02/06/21 0515 02/06/21 0600  BP: 133/80 122/82 (!) 148/98   Pulse: 83 81 85   Resp: 18  16   Temp: 98.3 F (36.8 C) 98.1 F (36.7 C) 98.3 F (36.8 C)   TempSrc: Oral Oral Oral   SpO2: 95% 96% 96%   Weight:    67.8 kg  Height:        General: Pt is alert, awake, not in acute distress Cardiovascular: RRR, S1/S2 +, no rubs, no gallops Respiratory: CTA bilaterally, no wheezing, no rhonchi Abdominal: Soft, NT, ND, bowel sounds + Extremities:  Mild edema and swelling right lateral and anterior thigh.  No evidence of erythema or induration.  Distal neurovascular status intact.    The results of significant diagnostics from  this hospitalization (including imaging, microbiology, ancillary and laboratory) are listed below for reference.     Microbiology: Recent Results (from the past 240 hour(s))  Urine Culture     Status: None   Collection Time: 01/27/21  7:33 PM   Specimen: Urine, Clean Catch  Result Value Ref Range Status   Specimen Description URINE, CLEAN CATCH  Final   Special Requests NONE  Final   Culture   Final    NO GROWTH Performed at [facility], [address] [place], [place] [address]    Report Status 01/29/2021 FINAL  Final  Gastrointestinal Panel by PCR , Stool     Status: None   Collection Time: 01/28/21  3:46 PM   Specimen: Stool  Result Value Ref Range Status   Campylobacter species NOT DETECTED NOT DETECTED Final   Plesimonas shigelloides NOT DETECTED NOT DETECTED Final   Salmonella species NOT DETECTED NOT DETECTED Final   Yersinia enterocolitica NOT DETECTED NOT DETECTED Final   Vibrio species NOT DETECTED NOT DETECTED Final   Vibrio cholerae NOT DETECTED NOT DETECTED Final   Enteroaggregative E coli (EAEC) NOT DETECTED NOT DETECTED Final   Enteropathogenic E coli (EPEC) NOT DETECTED NOT DETECTED Final   Enterotoxigenic E coli (ETEC) NOT  DETECTED NOT DETECTED Final   Shiga like toxin producing E coli (STEC) NOT DETECTED NOT DETECTED Final   Shigella/Enteroinvasive E coli (EIEC) NOT DETECTED NOT DETECTED Final   Cryptosporidium NOT DETECTED NOT DETECTED Final   Cyclospora cayetanensis NOT DETECTED NOT DETECTED Final   Entamoeba histolytica NOT DETECTED NOT DETECTED Final   Giardia lamblia NOT DETECTED NOT DETECTED Final   Adenovirus F40/41 NOT DETECTED NOT DETECTED Final   Astrovirus NOT DETECTED NOT DETECTED Final   Norovirus GI/GII NOT DETECTED NOT DETECTED Final   Rotavirus A NOT DETECTED NOT DETECTED Final   Sapovirus (I, II, IV, and V) NOT DETECTED NOT DETECTED Final    Comment: Performed at [facility], [address] [place], [place] [address]      Labs: BNP (last 3 results) No results for input(s): BNP in the last 8760 hours. Basic Metabolic Panel: Recent Labs  Lab 02/01/21 0410 02/02/21 0254 02/03/21 0258 02/04/21 0131 02/04/21 0643 02/05/21 0356 02/06/21 0312  NA 134* 133* 134* 135  --  136 137  K 4.4 4.7 5.1 5.8* 5.5* 4.8 5.2*  CL 106 104 105 102  --  101 102  CO2 17* 21* 21* 26  --  27 27  GLUCOSE 103* 111* 103* 103*  --  119* 87  BUN 73* 73* 71* 66*  --  56* 49*  CREATININE 10.38* 9.88* 8.78* 7.11*  --  6.03* 4.76*  CALCIUM 8.8* 8.7* 8.7* 9.1  --  8.8* 8.7*  MG 1.9 1.8 1.9  --   --   --   --   PHOS 6.4* 5.3* 5.3* 4.6  --  5.4* 5.2*   Liver Function Tests: Recent Labs  Lab 01/31/21 0219 02/01/21 0410 02/02/21 0254 02/03/21 0258 02/04/21 0131 02/05/21 0356 02/06/21 0312  AST 378* 247* 147* 87*  --   --   --   ALT 266* 238* 190* 141*  --   --   --   ALKPHOS 49 50 49 54  --   --   --   BILITOT 0.8 0.9 0.6 0.6  --   --   --   PROT 4.8* 5.1* 5.2* 4.8*  --   --   --   ALBUMIN 2.2* 2.4* 2.4* 2.3* 2.8* 2.4* 2.4*   No results for input(s): LIPASE, AMYLASE in the last 168 hours. No results for input(s): AMMONIA in the last 168 hours. CBC: Recent Labs  Lab 01/31/21 0219 02/01/21 0410 02/02/21 0254 02/03/21 0258  WBC 10.8* 11.2* 16.7* 14.5*  NEUTROABS 7.6 7.6 12.3* 9.5*  HGB 9.6* 9.9* 9.9* 9.5*  HCT 27.1* 27.4* 27.4* 27.3*  MCV 95.8 93.8 94.5 96.5  PLT 246 283 333 373   Cardiac Enzymes: Recent Labs  Lab 01/31/21 0219 02/01/21 0410 02/02/21 0254 02/03/21 0258  CKTOTAL 13,285* 8,360* 4,443* 2,374*   BNP: Invalid input(s): POCBNP CBG: No results for input(s): GLUCAP in the last 168 hours. D-Dimer No results for input(s): DDIMER in the last 72 hours. Hgb A1c No results for input(s): HGBA1C in the last 72 hours. Lipid Profile No results for input(s): CHOL, HDL, LDLCALC, TRIG, CHOLHDL, LDLDIRECT in the last 72 hours. Thyroid function studies No results for input(s): TSH, T4TOTAL, T3FREE,  THYROIDAB in the last 72 hours.  Invalid input(s): FREET3 Anemia work up No results for input(s): VITAMINB12, FOLATE, FERRITIN, TIBC, IRON, RETICCTPCT in the last 72 hours. Urinalysis    Component Value Date/Time   COLORURINE AMBER (A) 01/27/2021 0839   APPEARANCEUR CLOUDY (A) 01/27/2021  0839   LABSPEC 1.016 01/27/2021 0839   PHURINE 5.0 01/27/2021 0839   GLUCOSEU 50 (A) 01/27/2021 0839   HGBUR LARGE (A) 01/27/2021 0839   BILIRUBINUR NEGATIVE 01/27/2021 0839   KETONESUR NEGATIVE 01/27/2021 0839   PROTEINUR 100 (A) 01/27/2021 0839   NITRITE NEGATIVE 01/27/2021 0839   LEUKOCYTESUR NEGATIVE 01/27/2021 0839   Sepsis Labs Invalid input(s): PROCALCITONIN,  WBC,  LACTICIDVEN Microbiology Recent Results (from the past 240 hour(s))  Urine Culture     Status: None   Collection Time: 01/27/21  7:33 PM   Specimen: Urine, Clean Catch  Result Value Ref Range Status   Specimen Description URINE, CLEAN CATCH  Final   Special Requests NONE  Final   Culture   Final    NO GROWTH Performed at [facility], [address] [place], [place] [address]    Report Status 01/29/2021 FINAL  Final  Gastrointestinal Panel by PCR , Stool     Status: None   Collection Time: 01/28/21  3:46 PM   Specimen: Stool  Result Value Ref Range Status   Campylobacter species NOT DETECTED NOT DETECTED Final   Plesimonas shigelloides NOT DETECTED NOT DETECTED Final   Salmonella species NOT DETECTED NOT DETECTED Final   Yersinia enterocolitica NOT DETECTED NOT DETECTED Final   Vibrio species NOT DETECTED NOT DETECTED Final   Vibrio cholerae NOT DETECTED NOT DETECTED Final   Enteroaggregative E coli (EAEC) NOT DETECTED NOT DETECTED Final   Enteropathogenic E coli (EPEC) NOT DETECTED NOT DETECTED Final   Enterotoxigenic E coli (ETEC) NOT DETECTED NOT DETECTED Final   Shiga like toxin producing E coli (STEC) NOT DETECTED NOT DETECTED Final   Shigella/Enteroinvasive E coli (EIEC) NOT DETECTED NOT DETECTED Final    Cryptosporidium NOT DETECTED NOT DETECTED Final   Cyclospora cayetanensis NOT DETECTED NOT DETECTED Final   Entamoeba histolytica NOT DETECTED NOT DETECTED Final   Giardia lamblia NOT DETECTED NOT DETECTED Final   Adenovirus F40/41 NOT DETECTED NOT DETECTED Final   Astrovirus NOT DETECTED NOT DETECTED Final   Norovirus GI/GII NOT DETECTED NOT DETECTED Final   Rotavirus A NOT DETECTED NOT DETECTED Final   Sapovirus (I, II, IV, and V) NOT DETECTED NOT DETECTED Final    Comment: Performed at [facility], [address], [place], [place] [address]     Time coordinating discharge:  35 minutes  SIGNED:   Dorcas Carrow, MD  [facility] 02/06/2021, 1:50 PM

## 2021-02-06 NOTE — TOC Progression Note (Signed)
Transition of Care [facility]) - Progression Note    Patient Details  Name: Frank [name] MRN: [id number] Date of Birth: Jan 23, 1971  Transition of Care [facility]) CM/SW Contact  Kallie Locks, RN Phone Number: 02/06/2021, 11:10 AM  Clinical Narrative:   Adjusted dates on MATCH letter to reflect today's date and to expire on 02/14/21.  Prescriptions have to be sent to [facility] on [address] since the [employer] on [address] does not accept [facility]. MD made aware.  DME ordered and to be delivered to room prior to dc. However, patient just needs rolling walker. Called Jermaine with [employer] to cancel delivery for bedside commode. Patient agrees.   RW to be delivered to room prior to dc.       Barriers to Discharge: No Barriers Identified  Expected Discharge Plan and Services           Expected Discharge Date: 02/06/21                 DME Agency: [employer] Date DME Agency Contacted: 02/06/21 Time DME Agency Contacted: [phone] Representative spoke with at DME Agency: Vaughan Basta             Social Determinants of Health (SDOH) Interventions    Readmission Risk Interventions No flowsheet data found.

## 2021-02-06 NOTE — Progress Notes (Signed)
Pt discharge education and instructions completed. Pt voices understanding and denies any questions. Pt IV and telemetry removed. Pt discharged home with family member to transport him home. Pt handed his work note and pt to pick up electronically sent prescriptions from preferred pharmacy on file. Pt transported off unit via wheelchair with belongings to the side including his delivered home DME walker. Dionne Bucy RN

## 2021-02-06 NOTE — Progress Notes (Signed)
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  Nonoliguric and creatinine level continue to improve.  No uremic symptoms.  I expect renal recovery to his baseline. I will arrange outpatient follow-up with me.  Meantime I recommend to check renal panel with his PCP in a week to make sure that the kidney function is continued to improve with acceptable electrolytes.  #  Hyperkalemia: Likely secondary to unrestricted oral intake in the setting of acute kidney injury.  Mildly uptrend of potassium to 5.2 today.  Receiving Lokelma.  I discussed about low potassium diet with him.  # Rhabdomyolysis: Clinically better with downtrending CPK level, liberal fluid intake by mouth.    #  Hyponatremia: Secondary to acute kidney injury and impaired free water handling.  Improved now.  #  Anemia: Likely exacerbated by retroperitoneal bleeding from trauma, no indication for PRBC transfusion.  #  History of polysubstance abuse: Discussed risks and encouraged cessation.  Sign off, please call back with question. Discussed with the primary team.  Subjective: Seen and examined at bedside.  No new event.  Denies nausea vomiting, chest pain, shortness of breath.  Nonoliguric.   Objective Vital signs in last 24 hours: Vitals:   02/05/21 1110 02/05/21 2015 02/06/21 0515 02/06/21 0600  BP: 133/80 122/82 (!) 148/98   Pulse: 83 81 85   Resp: 18  16   Temp: 98.3 F (36.8 C) 98.1 F (36.7 C) 98.3 F (36.8 C)   TempSrc: Oral Oral Oral   SpO2: 95% 96% 96%   Weight:    67.8 kg  Height:       Weight change: 0.532 kg  Intake/Output Summary (Last 24 hours) at 02/06/2021 0847 Last data filed at 02/06/2021 0723 Gross per 24 hour  Intake 840 ml  Output 1800 ml  Net -960 ml        Labs: Basic Metabolic Panel: Recent Labs   Lab 02/04/21 0131 02/04/21 0643 02/05/21 0356 02/06/21 0312  NA 135  --  136 137  K 5.8* 5.5* 4.8 5.2*  CL 102  --  101 102  CO2 26  --  27 27  GLUCOSE 103*  --  119* 87  BUN 66*  --  56* 49*  CREATININE 7.11*  --  6.03* 4.76*  CALCIUM 9.1  --  8.8* 8.7*  PHOS 4.6  --  5.4* 5.2*    Liver Function Tests: Recent Labs  Lab 02/01/21 0410 02/02/21 0254 02/03/21 0258 02/04/21 0131 02/05/21 0356 02/06/21 0312  AST 247* 147* 87*  --   --   --   ALT 238* 190* 141*  --   --   --   ALKPHOS 50 49 54  --   --   --   BILITOT 0.9 0.6 0.6  --   --   --   PROT 5.1* 5.2* 4.8*  --   --   --   ALBUMIN 2.4* 2.4* 2.3* 2.8* 2.4* 2.4*    No results for input(s): LIPASE, AMYLASE in the last 168 hours. No results for input(s): AMMONIA in the last 168 hours. CBC: Recent Labs  Lab 01/31/21 0219 02/01/21 0410 02/02/21 0254 02/03/21 0258  WBC 10.8* 11.2* 16.7* 14.5*  NEUTROABS 7.6 7.6 12.3* 9.5*  HGB 9.6* 9.9* 9.9* 9.5*  HCT 27.1* 27.4* 27.4* 27.3*  MCV 95.8 93.8 94.5 96.5  PLT 246 283 333  373    Cardiac Enzymes: Recent Labs  Lab 01/31/21 0219 02/01/21 0410 02/02/21 0254 02/03/21 0258  CKTOTAL 13,285* 8,360* 4,443* 2,374*    CBG: No results for input(s): GLUCAP in the last 168 hours.  Iron Studies: No results for input(s): IRON, TIBC, TRANSFERRIN, FERRITIN in the last 72 hours. Studies/Results: No results found.  Medications: Infusions:   Scheduled Medications:  amLODipine  5 mg Oral Daily   folic acid  1 mg Oral Daily   multivitamin with minerals  1 tablet Oral Daily   polyethylene glycol  17 g Oral BID   sodium bicarbonate  1,300 mg Oral BID   thiamine  100 mg Oral Daily    have reviewed scheduled and prn medications.  Physical Exam: General:NAD, able to lie flat comfortable. Heart:RRR, s1s2 nl Lungs:clear b/l, no crackle Abdomen:soft, Non-tender, non-distended Extremities:No edema Neurology: Alert, awake and following commands  Norlene Lanes Prasad  Nishika Parkhurst 02/06/2021,8:47 AM  LOS: 10 days

## 2021-02-18 ENCOUNTER — Other Ambulatory Visit: Payer: Self-pay

## 2021-02-18 ENCOUNTER — Emergency Department (HOSPITAL_COMMUNITY)
Admission: EM | Admit: 2021-02-18 | Discharge: 2021-02-19 | Disposition: A | Discharge status: Home / Self Care | Payer: Self-pay | Attending: Emergency Medicine | Admitting: Emergency Medicine

## 2021-02-18 ENCOUNTER — Emergency Department (HOSPITAL_COMMUNITY): Payer: Self-pay

## 2021-02-18 ENCOUNTER — Encounter (HOSPITAL_COMMUNITY): Payer: Self-pay | Admitting: Emergency Medicine

## 2021-02-18 DIAGNOSIS — R202 Paresthesia of skin: Secondary | ICD-10-CM | POA: Insufficient documentation

## 2021-02-18 DIAGNOSIS — J45909 Unspecified asthma, uncomplicated: Secondary | ICD-10-CM | POA: Insufficient documentation

## 2021-02-18 DIAGNOSIS — R1084 Generalized abdominal pain: Secondary | ICD-10-CM | POA: Insufficient documentation

## 2021-02-18 DIAGNOSIS — M79604 Pain in right leg: Secondary | ICD-10-CM | POA: Insufficient documentation

## 2021-02-18 DIAGNOSIS — W1789XA Other fall from one level to another, initial encounter: Secondary | ICD-10-CM | POA: Insufficient documentation

## 2021-02-18 DIAGNOSIS — R109 Unspecified abdominal pain: Secondary | ICD-10-CM

## 2021-02-18 DIAGNOSIS — F17210 Nicotine dependence, cigarettes, uncomplicated: Secondary | ICD-10-CM | POA: Insufficient documentation

## 2021-02-18 LAB — BASIC METABOLIC PANEL
Anion gap: 6 (ref 5–15)
BUN: 12 mg/dL (ref 6–20)
CO2: 27 mmol/L (ref 22–32)
Calcium: 8.9 mg/dL (ref 8.9–10.3)
Chloride: 107 mmol/L (ref 98–111)
Creatinine, Ser: 1.56 mg/dL — ABNORMAL HIGH (ref 0.61–1.24)
GFR, Estimated: 53 mL/min — ABNORMAL LOW (ref >60)
Glucose, Bld: 107 mg/dL — ABNORMAL HIGH (ref 70–99)
Potassium: 4 mmol/L (ref 3.5–5.1)
Sodium: 140 mmol/L (ref 135–145)

## 2021-02-18 LAB — CBC
HCT: 26.9 % — ABNORMAL LOW (ref 39.0–52.0)
Hemoglobin: 8.9 g/dL — ABNORMAL LOW (ref 13.0–17.0)
MCH: 32.8 pg (ref 26.0–34.0)
MCHC: 33.1 g/dL (ref 30.0–36.0)
MCV: 99.3 fL (ref 80.0–100.0)
Platelets: 404 10*3/uL — ABNORMAL HIGH (ref 150–400)
RBC: 2.71 MIL/uL — ABNORMAL LOW (ref 4.22–5.81)
RDW: 13.4 % (ref 11.5–15.5)
WBC: 7.9 10*3/uL (ref 4.0–10.5)
nRBC: 0 % (ref 0.0–0.2)

## 2021-02-18 IMAGING — DX DG HIP (WITH OR WITHOUT PELVIS) 2-3V*R*
3 series · 3 of 3 positions shown · non-contrast
Comparison: None.

CLINICAL DATA: Status post fall.

EXAM:
DG HIP (WITH OR WITHOUT PELVIS) 2-3V RIGHT

[pelvis ap]
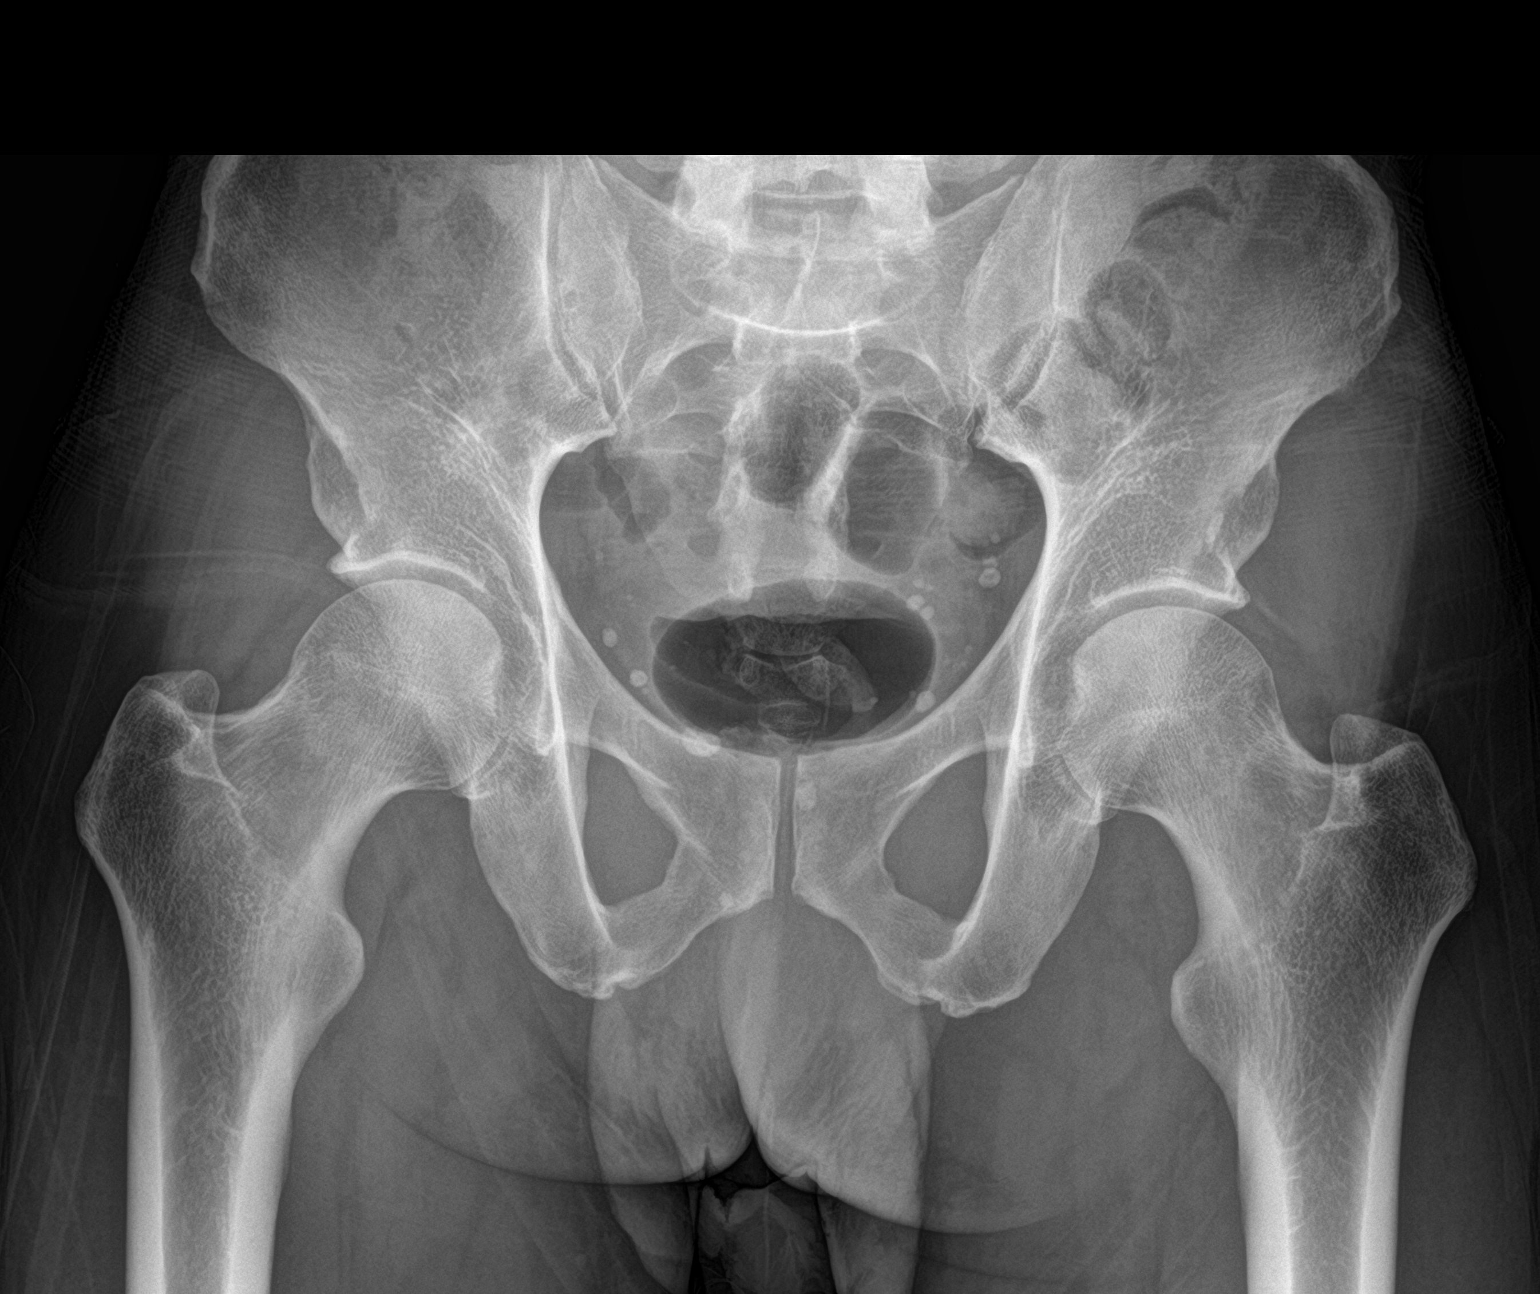

[hip ap]
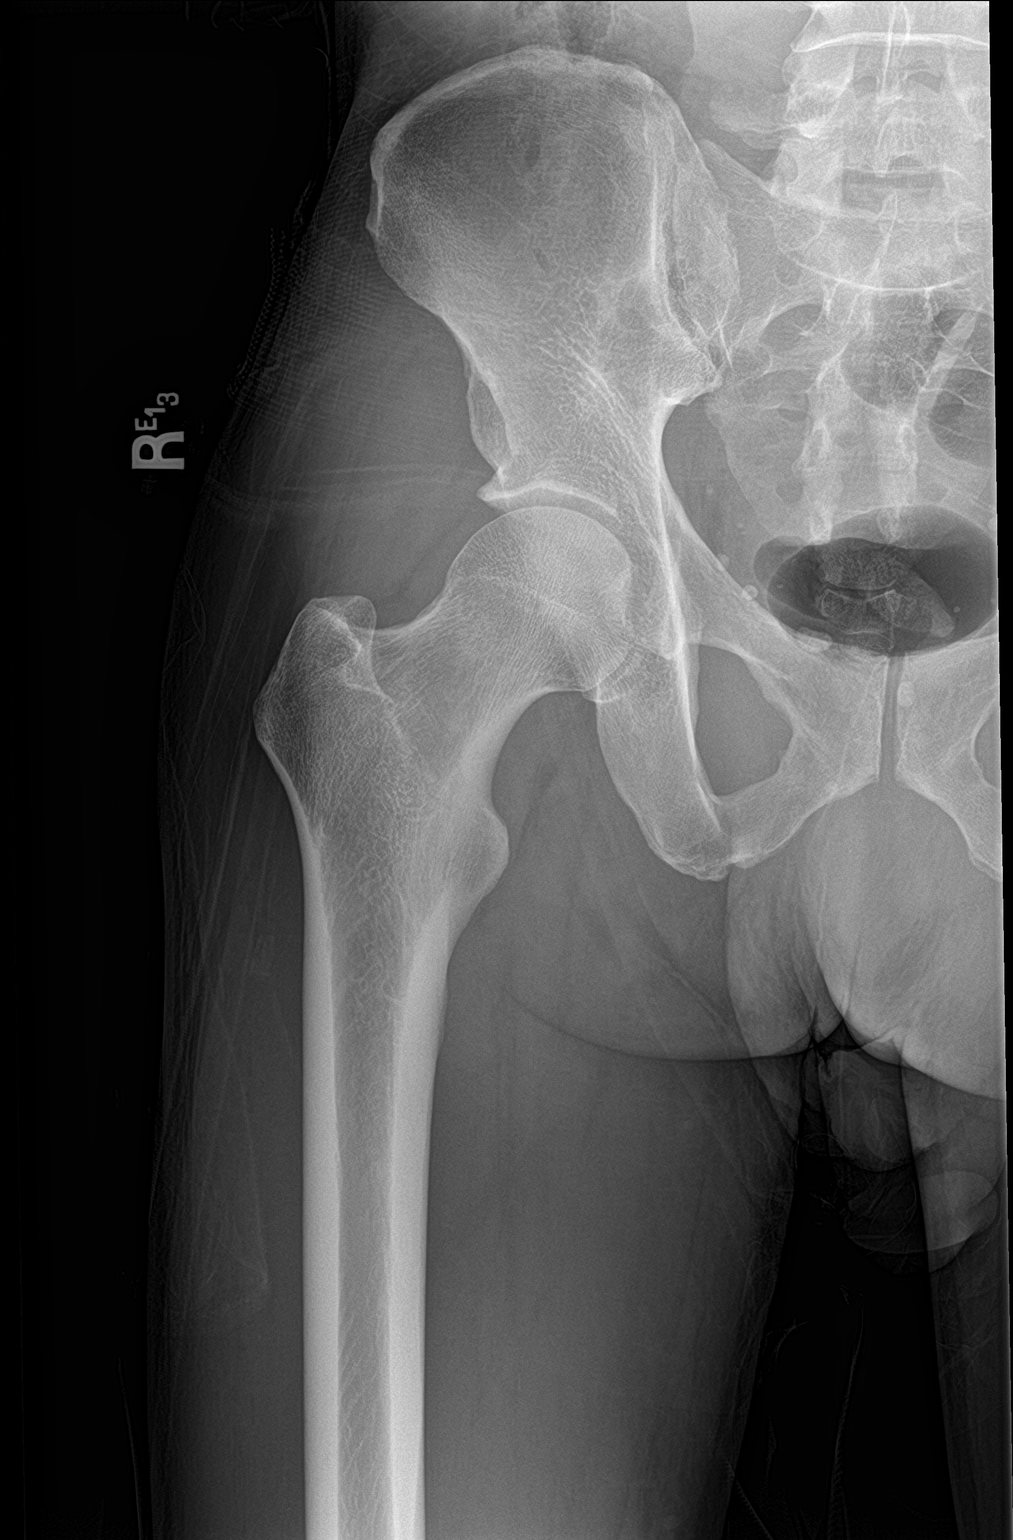

[hip lat]
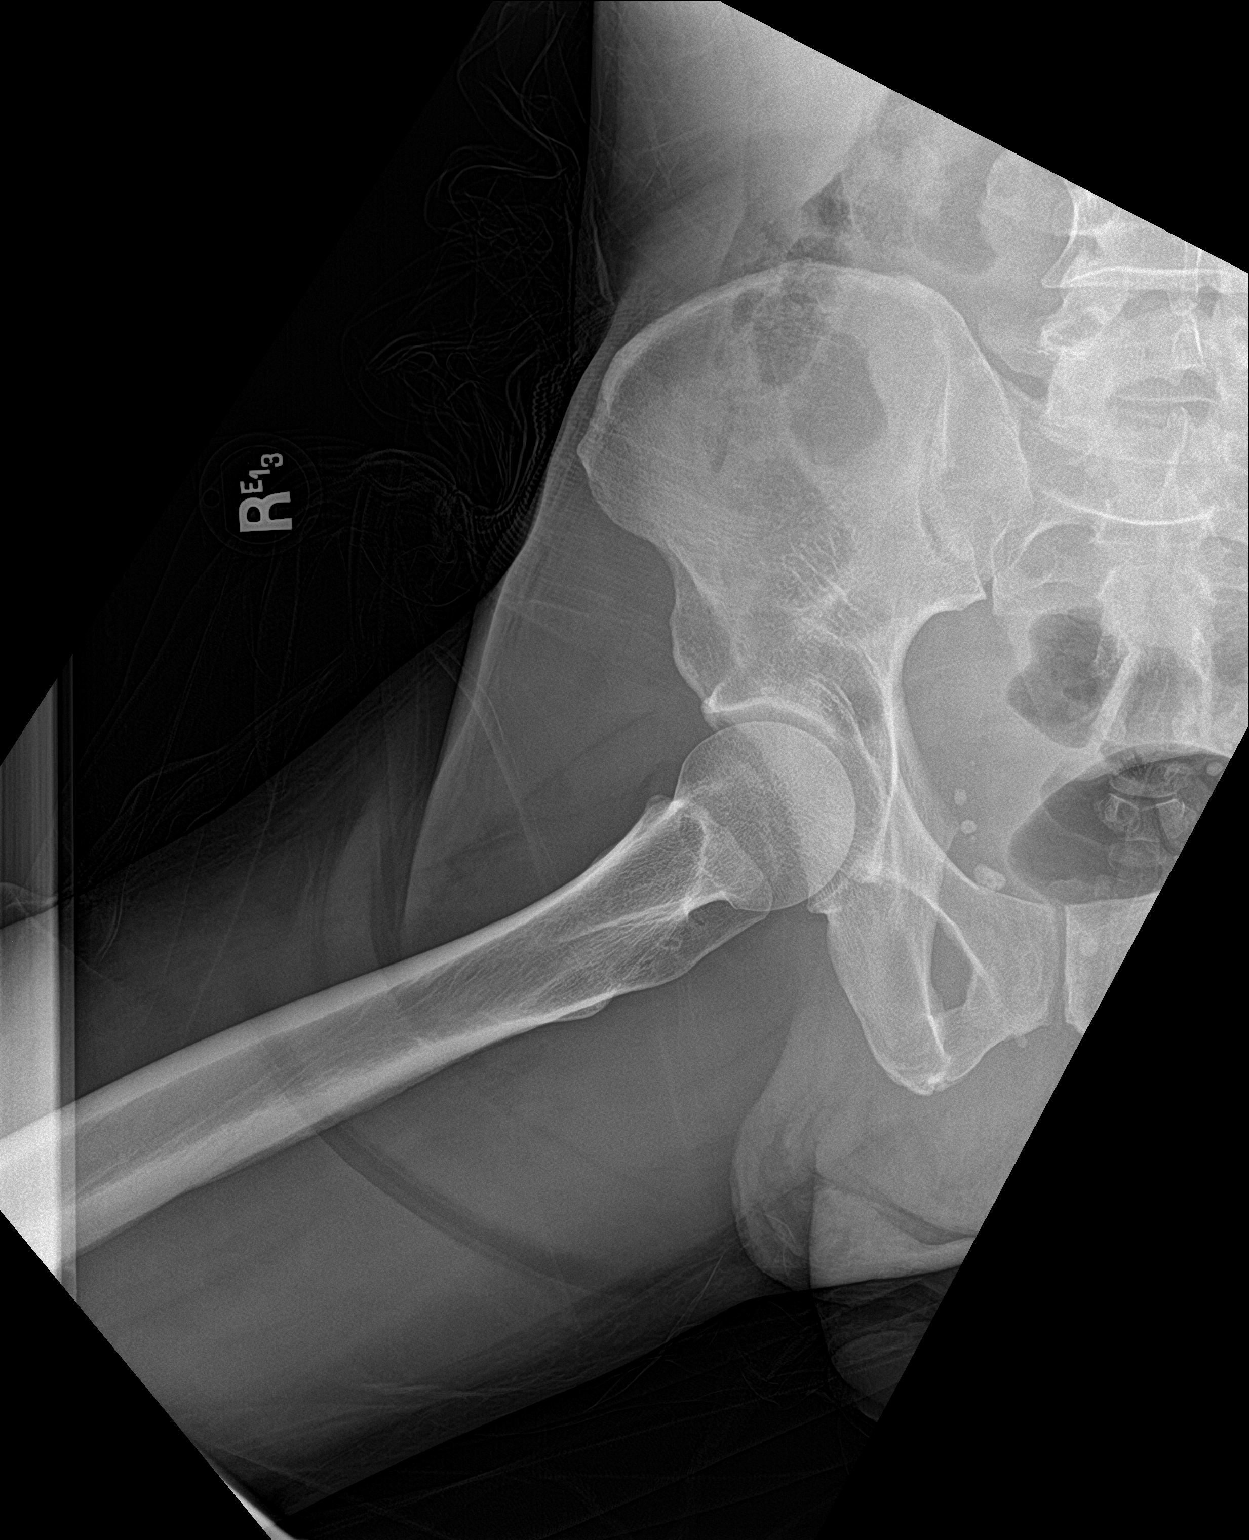

[3 of 3 positions shown; findings below may reference images not displayed]

FINDINGS: There is no evidence of hip fracture or dislocation. There is no
evidence of arthropathy or other focal bone abnormality. Multiple
phleboliths are seen within the lower pelvis.
IMPRESSION: Negative.
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
## 2021-02-19 ENCOUNTER — Emergency Department (HOSPITAL_COMMUNITY): Payer: Self-pay

## 2021-02-19 LAB — COMPREHENSIVE METABOLIC PANEL
ALT: 23 U/L (ref 0–44)
AST: 25 U/L (ref 15–41)
Albumin: 3.7 g/dL (ref 3.5–5.0)
Alkaline Phosphatase: 45 U/L (ref 38–126)
Anion gap: 9 (ref 5–15)
BUN: 10 mg/dL (ref 6–20)
CO2: 26 mmol/L (ref 22–32)
Calcium: 9.5 mg/dL (ref 8.9–10.3)
Chloride: 104 mmol/L (ref 98–111)
Creatinine, Ser: 1.42 mg/dL — ABNORMAL HIGH (ref 0.61–1.24)
GFR, Estimated: 59 mL/min — ABNORMAL LOW (ref >60)
Glucose, Bld: 95 mg/dL (ref 70–99)
Potassium: 3.7 mmol/L (ref 3.5–5.1)
Sodium: 139 mmol/L (ref 135–145)
Total Bilirubin: 0.3 mg/dL (ref 0.3–1.2)
Total Protein: 6.7 g/dL (ref 6.5–8.1)

## 2021-02-19 LAB — URINALYSIS, ROUTINE W REFLEX MICROSCOPIC
Bilirubin Urine: NEGATIVE
Glucose, UA: NEGATIVE mg/dL
Hgb urine dipstick: NEGATIVE
Ketones, ur: NEGATIVE mg/dL
Leukocytes,Ua: NEGATIVE
Nitrite: NEGATIVE
Protein, ur: NEGATIVE mg/dL
Specific Gravity, Urine: 1.013 (ref 1.005–1.030)
pH: 5 (ref 5.0–8.0)

## 2021-02-19 LAB — CBC WITH DIFFERENTIAL/PLATELET
Abs Immature Granulocytes: 0.02 10*3/uL (ref 0.00–0.07)
Basophils Absolute: 0.1 10*3/uL (ref 0.0–0.1)
Basophils Relative: 1 %
Eosinophils Absolute: 0.2 10*3/uL (ref 0.0–0.5)
Eosinophils Relative: 4 %
HCT: 28.2 % — ABNORMAL LOW (ref 39.0–52.0)
Hemoglobin: 9.1 g/dL — ABNORMAL LOW (ref 13.0–17.0)
Immature Granulocytes: 0 %
Lymphocytes Relative: 39 %
Lymphs Abs: 2.7 10*3/uL (ref 0.7–4.0)
MCH: 32.4 pg (ref 26.0–34.0)
MCHC: 32.3 g/dL (ref 30.0–36.0)
MCV: 100.4 fL — ABNORMAL HIGH (ref 80.0–100.0)
Monocytes Absolute: 0.9 10*3/uL (ref 0.1–1.0)
Monocytes Relative: 13 %
Neutro Abs: 3 10*3/uL (ref 1.7–7.7)
Neutrophils Relative %: 43 %
Platelets: 430 10*3/uL — ABNORMAL HIGH (ref 150–400)
RBC: 2.81 MIL/uL — ABNORMAL LOW (ref 4.22–5.81)
RDW: 13.4 % (ref 11.5–15.5)
WBC: 6.9 10*3/uL (ref 4.0–10.5)
nRBC: 0 % (ref 0.0–0.2)

## 2021-02-19 LAB — CK: Total CK: 128 U/L (ref 49–397)

## 2021-02-19 LAB — LACTIC ACID, PLASMA: Lactic Acid, Venous: 1.1 mmol/L (ref 0.5–1.9)

## 2021-02-19 LAB — LIPASE, BLOOD: Lipase: 81 U/L — ABNORMAL HIGH (ref 11–51)

## 2021-02-19 IMAGING — DX DG ANKLE COMPLETE 3+V*R*
3 series · 3 of 3 positions shown · non-contrast
Comparison: None.

CLINICAL DATA: Ankle pain, fell a few weeks ago

EXAM:
RIGHT ANKLE - COMPLETE 3+ VIEW

[ankle ap]
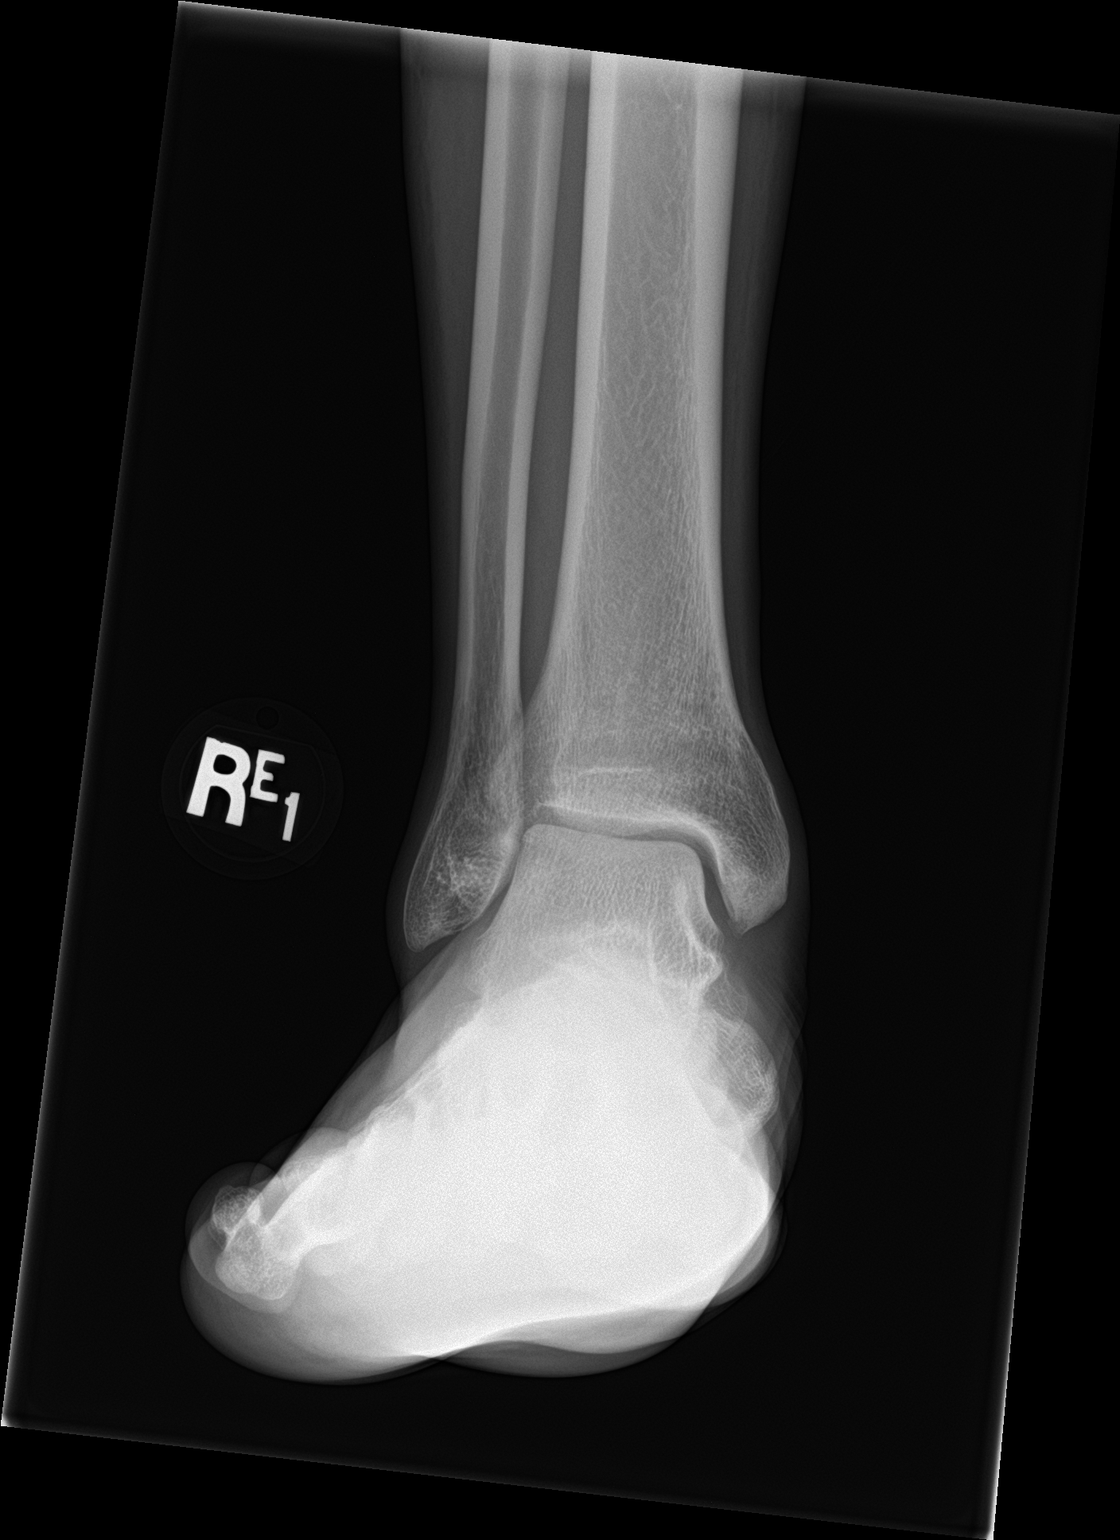

[ankle obl]
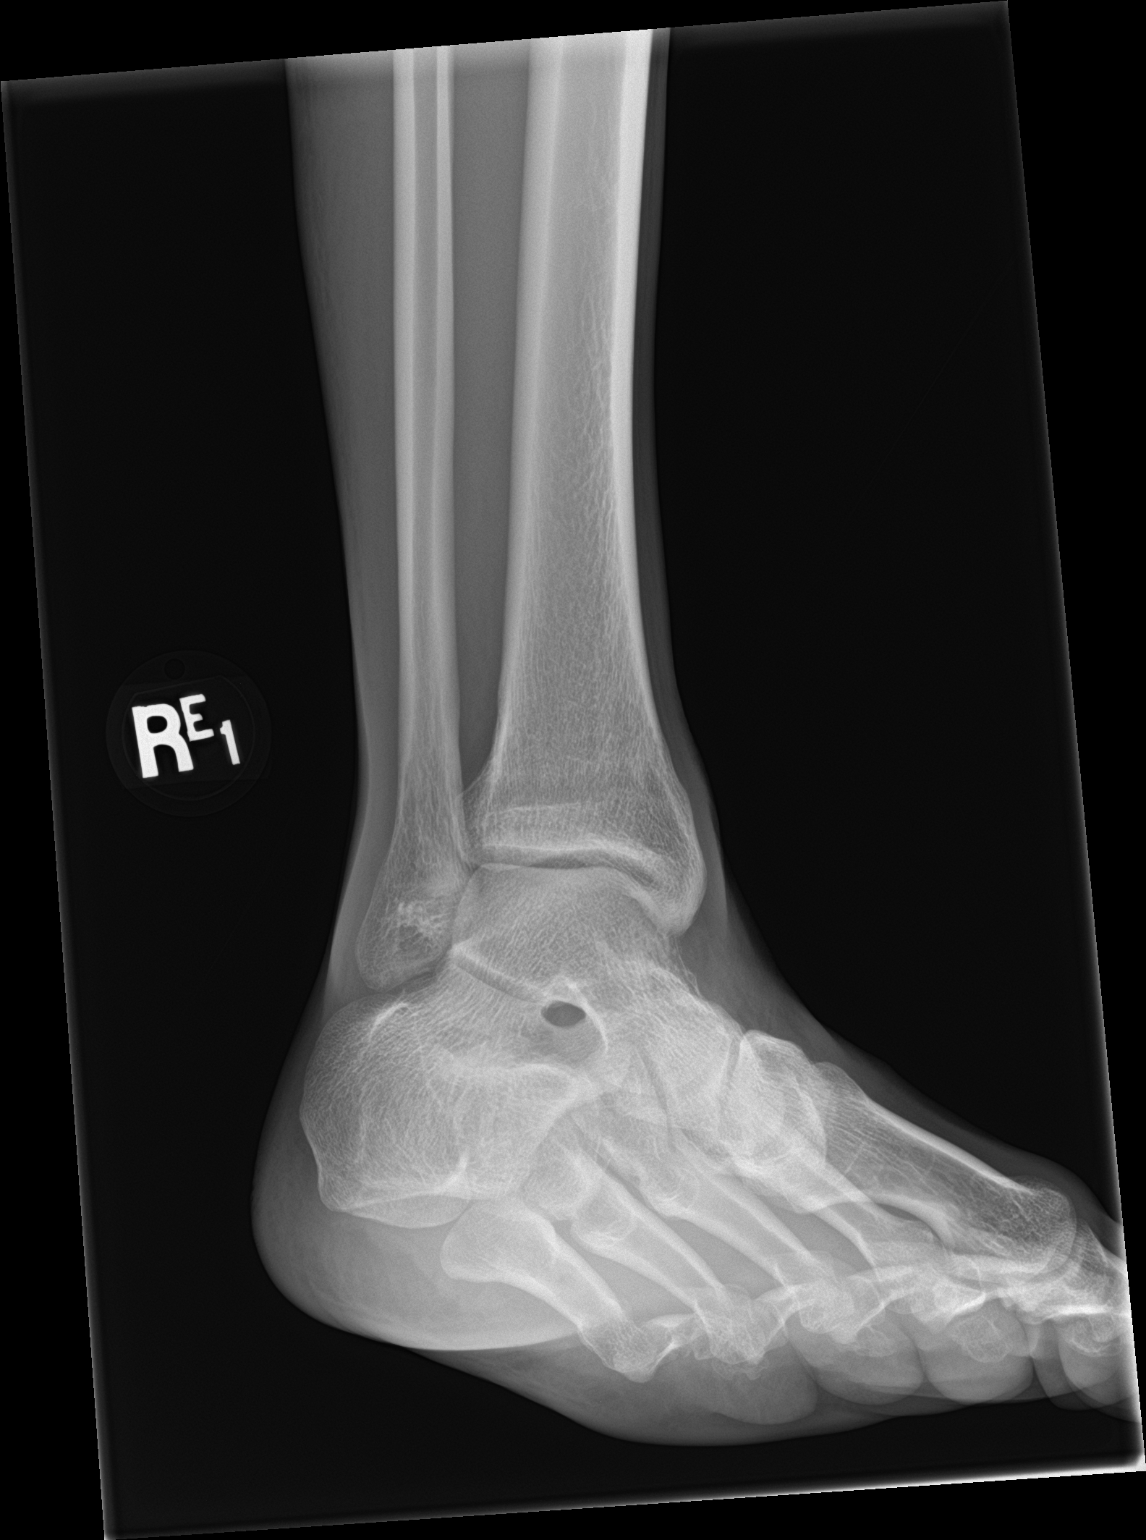

[ankle lat]
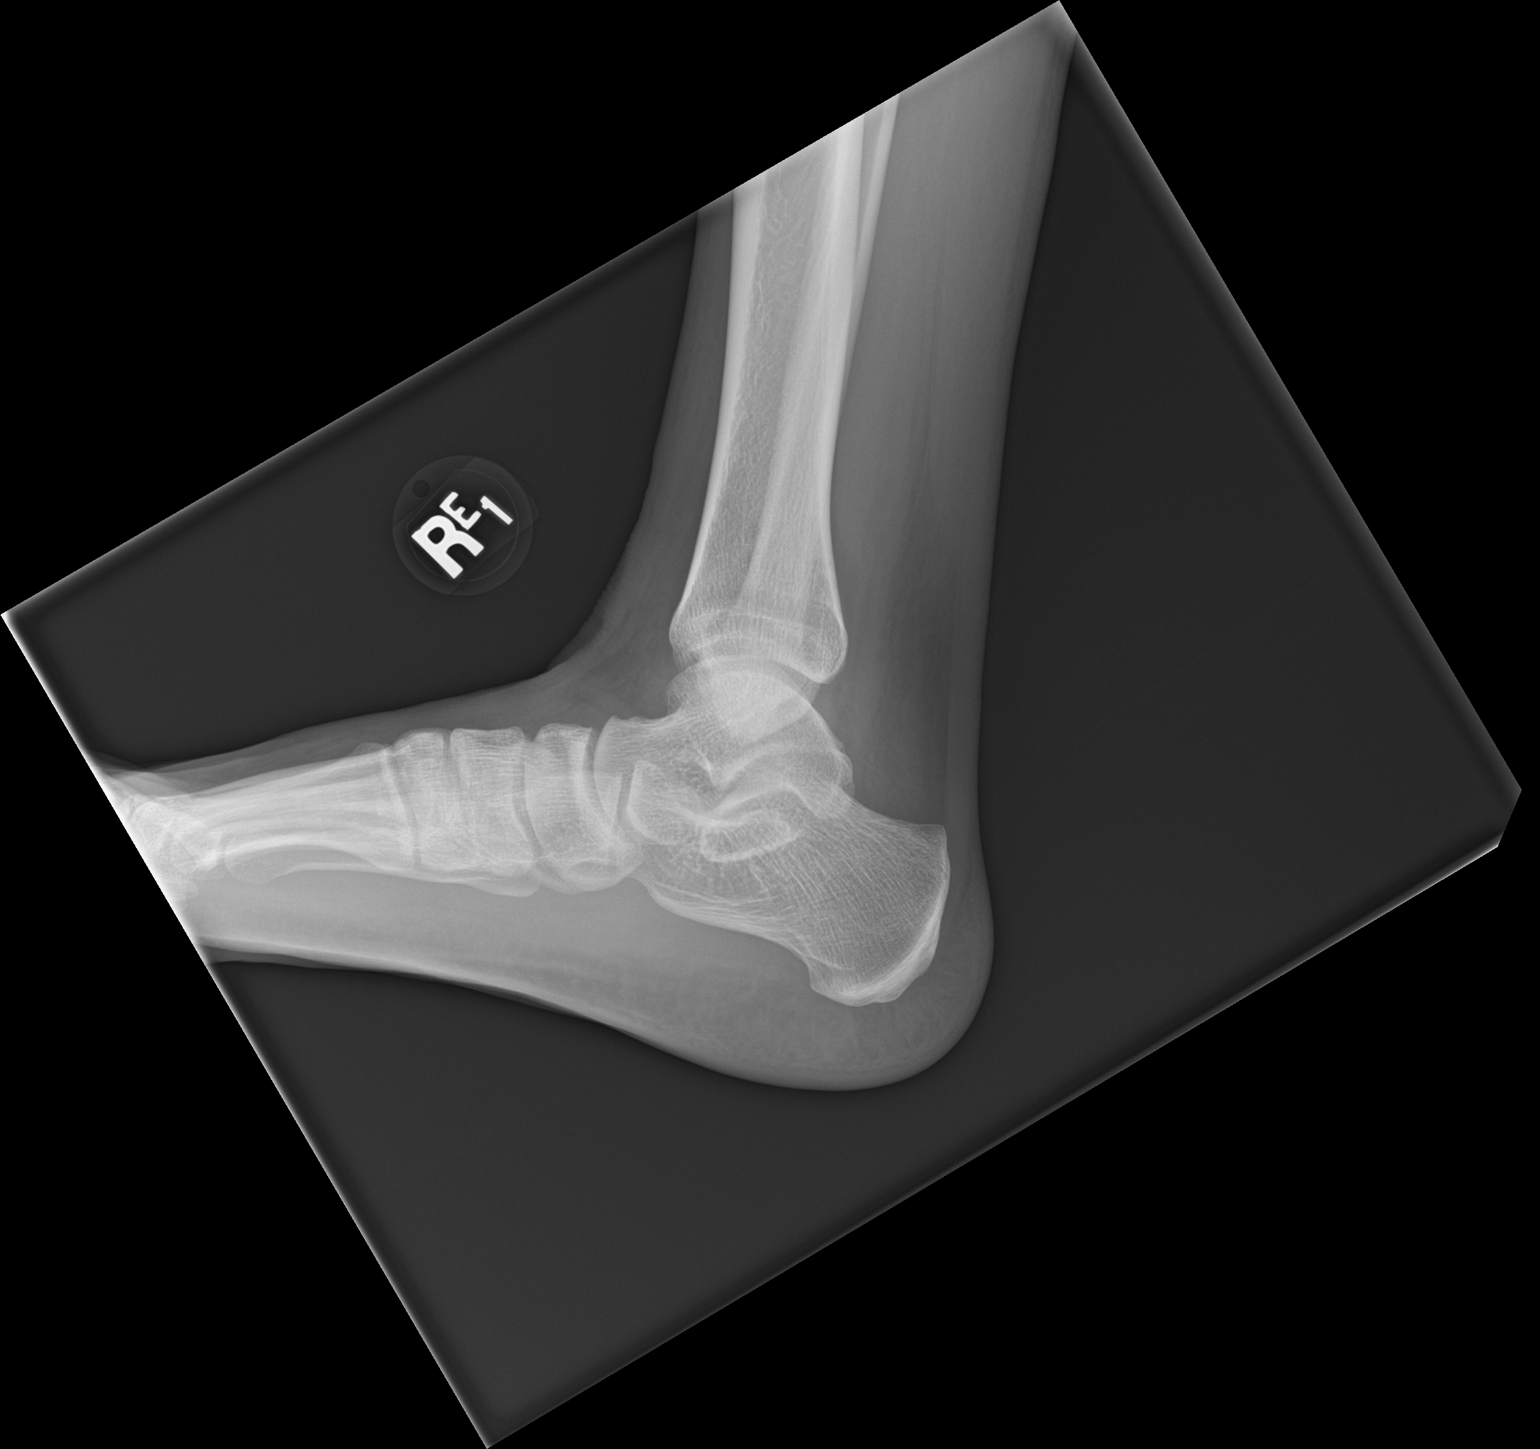

[3 of 3 positions shown; findings below may reference images not displayed]

FINDINGS: There is no acute fracture or dislocation. Bony alignment is normal.
The ankle mortise is intact. The soft tissues are unremarkable.
IMPRESSION: No acute fracture or dislocation.

## 2021-02-19 IMAGING — CT CT ABD-PELV W/ CM
2 of 5 series · 16 of 46 positions shown, 18 images · IV contrast (omnipaque)
Comparison: [DATE]

CLINICAL DATA: Worsening pain

EXAM:
CT ABDOMEN AND PELVIS WITH CONTRAST
TECHNIQUE: Multidetector CT imaging of the abdomen and pelvis was performed
using the standard protocol following bolus administration of
intravenous contrast.
CONTRAST:  100mL OMNIPAQUE IOHEXOL 350 MG/ML SOLN

[Series 3: a/p w/ 5mm · axial · 0.64mm/px · z∈[+896,+1306]mm · 13 of 92 slices shown, 15 images]
[im 5/92  soft-tissue]
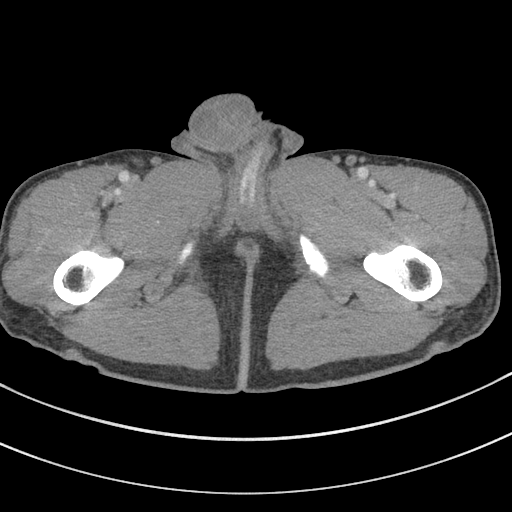
[im 5/92  bone]
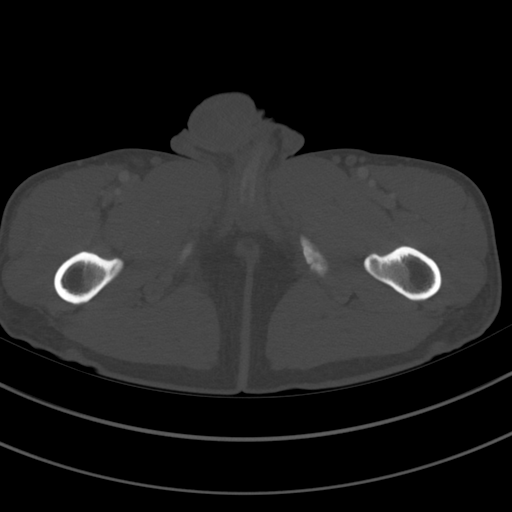
[im 14/92  soft-tissue]
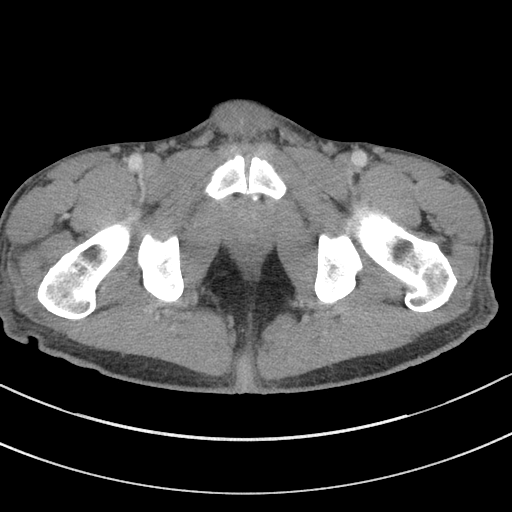
[im 19/92  soft-tissue]
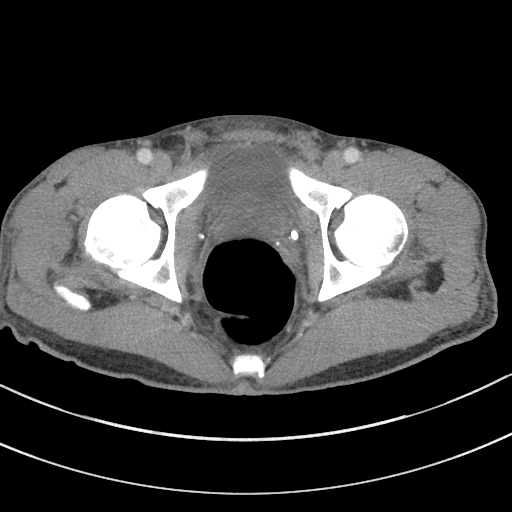
[im 28/92  soft-tissue]
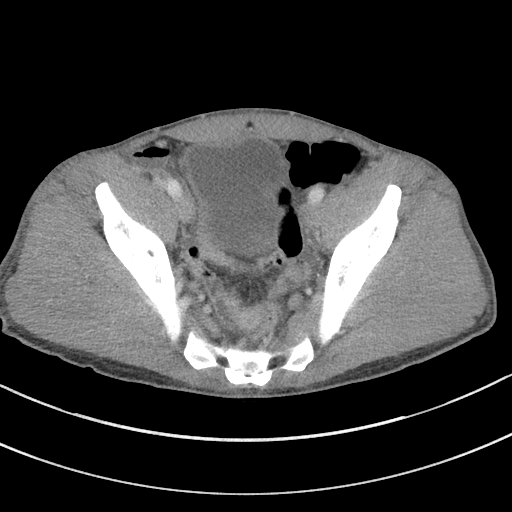
[im 32/92  soft-tissue]
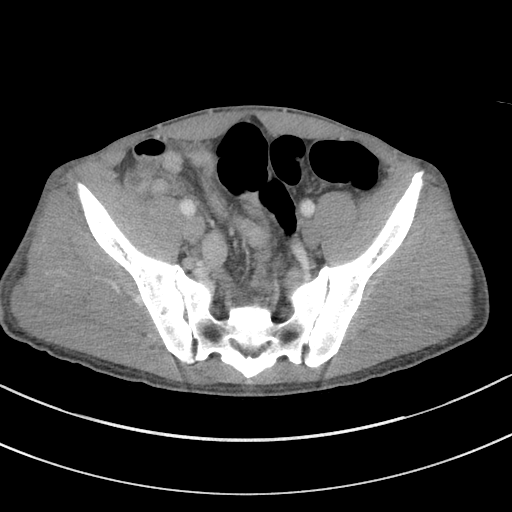
[im 41/92  soft-tissue]
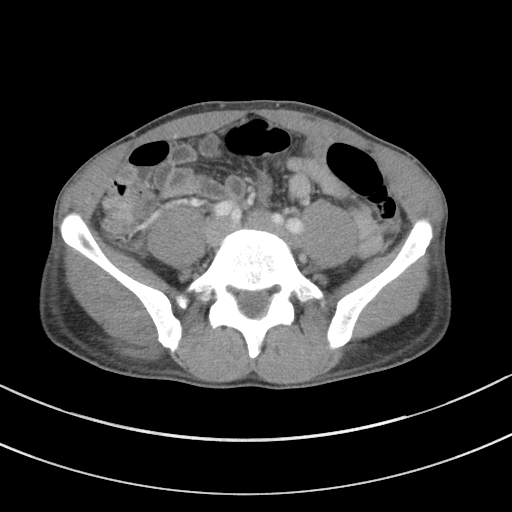
[im 46/92  soft-tissue]
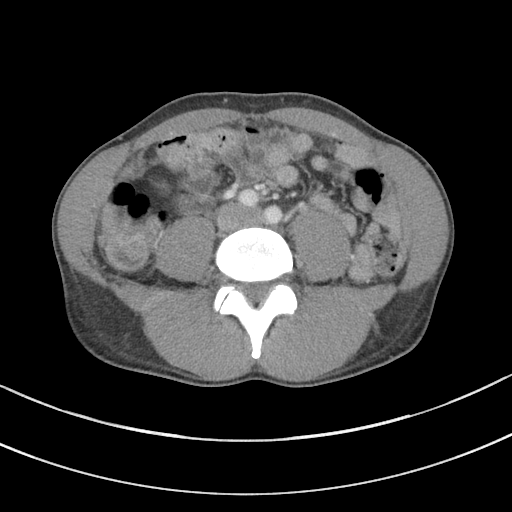
[im 51/92  soft-tissue]
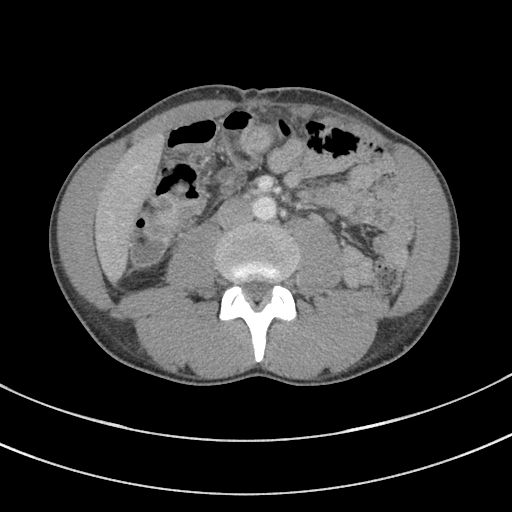
[im 60/92  soft-tissue]
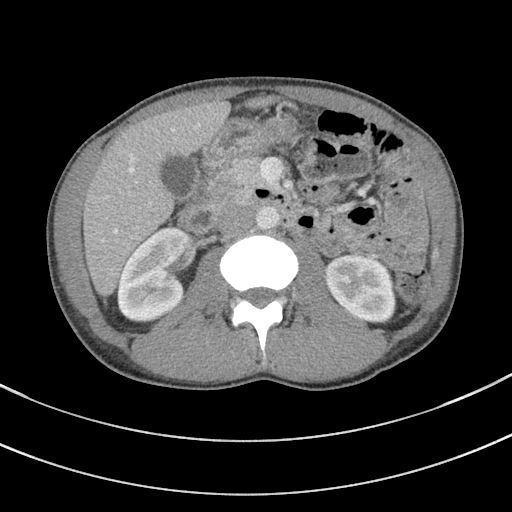
[im 60/92  bone]
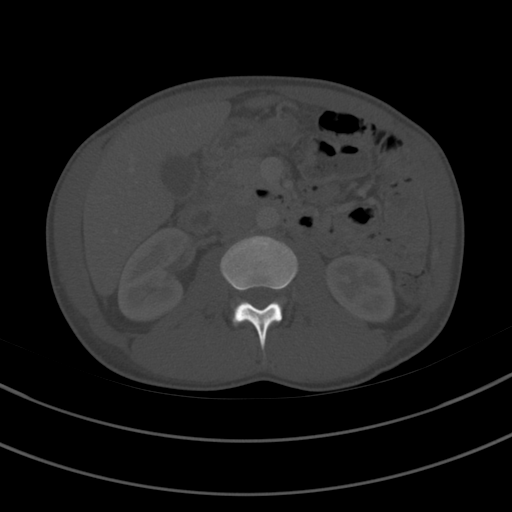
[im 64/92  soft-tissue]
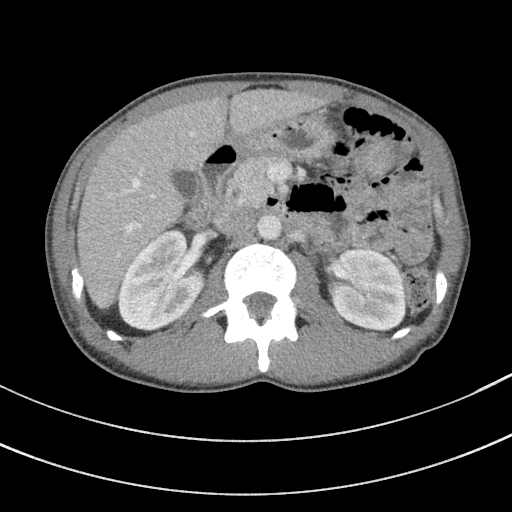
[im 73/92  soft-tissue]
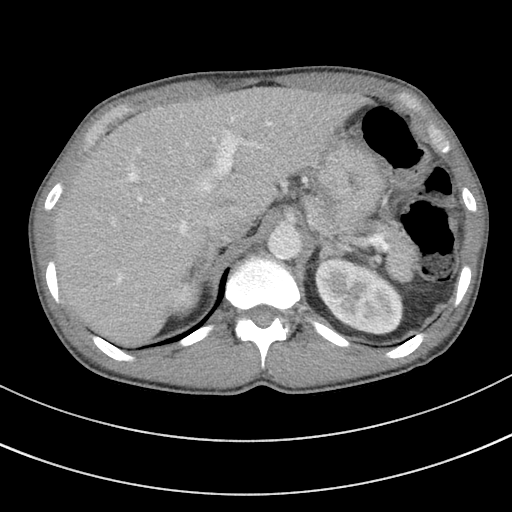
[im 78/92  soft-tissue]
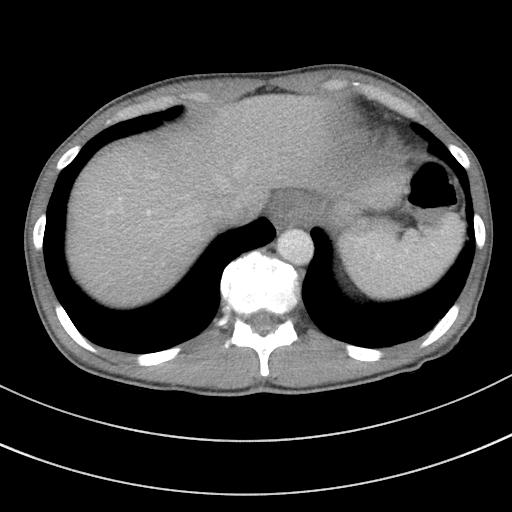
[im 87/92  soft-tissue]
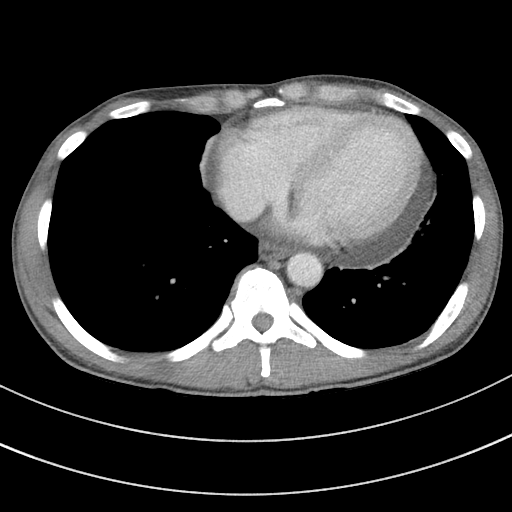

[Series 7: a/p w/ cor · coronal · 0.71mm/px · 3 of 110 slices shown]
[im 37/110  soft-tissue]
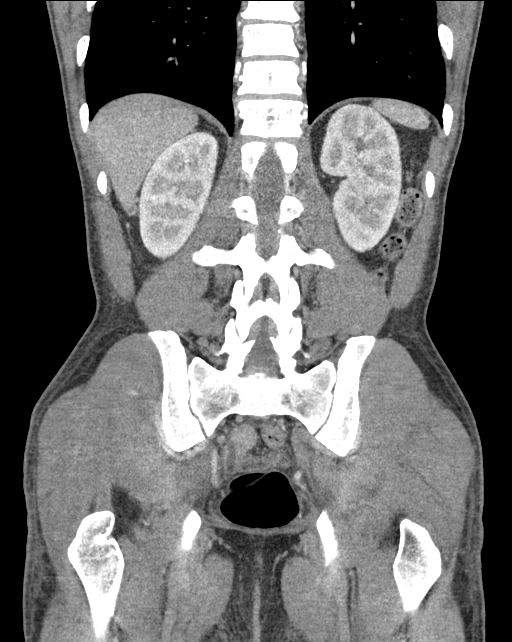
[im 49/110  soft-tissue]
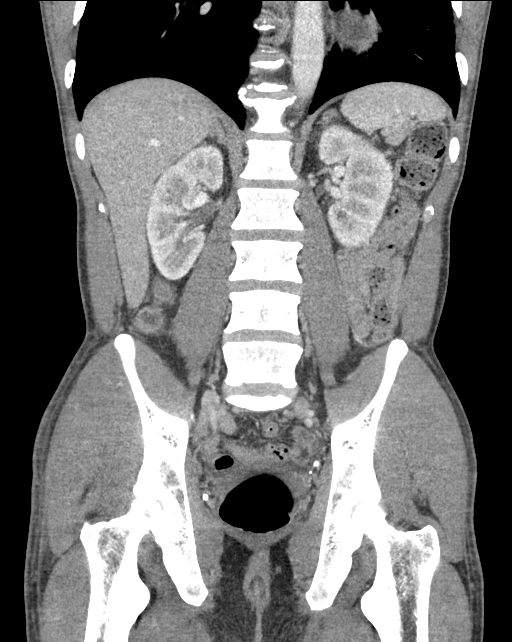
[im 61/110  soft-tissue]
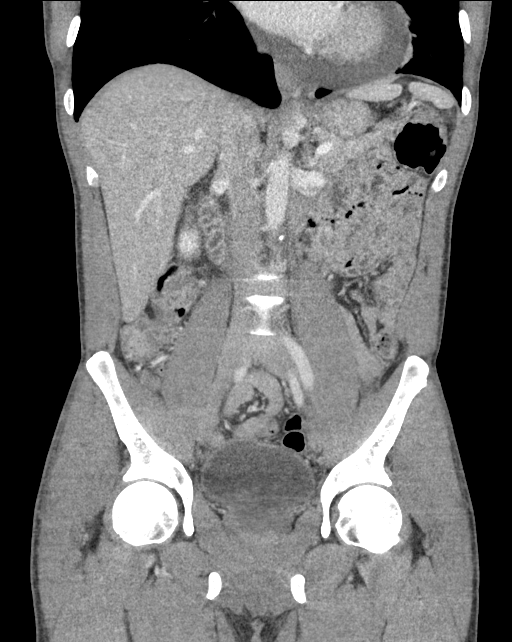

[16 of 46 positions shown; findings below may reference images not displayed]

FINDINGS: Lower chest: New cardiomegaly and new pericardial effusion. Pleural
effusions have resolved.

Hepatobiliary: No focal liver abnormality is seen. No gallstones,
gallbladder wall thickening, or biliary dilatation.

Pancreas: Unremarkable. No pancreatic ductal dilatation or
surrounding inflammatory changes.

Spleen: Normal in size without focal abnormality.

Adrenals/Urinary Tract: 1 cm focal nodularity of the left adrenal is
present on prior noncontrast study. Kidneys are unremarkable.
Bladder is unremarkable.

Stomach/Bowel: Stomach is within normal limits. Bowel is normal in
caliber. Normal appendix.

Vascular/Lymphatic: Mild aortic atherosclerosis. No enlarged lymph
nodes.

Reproductive: Unremarkable.

Other: No fluid collections.

Musculoskeletal: Decreased edema in the right gluteal and upper
thigh region. This is better evaluated on concurrent dedicated
imaging. No acute osseous abnormality.
IMPRESSION: New cardiomegaly and pericardial effusion.

Resolution of pleural effusions.

Indeterminate 1 cm nodule of left adrenal. This can be evaluated as
an outpatient.

Right gluteal/thigh findings discussed separately.
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
## 2021-02-19 MED ORDER — IOHEXOL 350 MG/ML SOLN
100.0000 mL | Freq: Once | INTRAVENOUS | Status: AC | PRN
  Administered 2021-02-19: 100 mL via INTRAVENOUS

## 2021-02-19 MED ORDER — FENTANYL CITRATE PF 50 MCG/ML IJ SOSY
50.0000 ug | PREFILLED_SYRINGE | Freq: Once | INTRAMUSCULAR | Status: AC
  Administered 2021-02-19: 50 ug via INTRAVENOUS
  Filled 2021-02-19: qty 1

## 2021-02-19 MED ORDER — OXYCODONE-ACETAMINOPHEN 5-325 MG PO TABS: 1.0000 | ORAL_TABLET | Freq: Four times a day (QID) | ORAL | 0 refills | Status: DC | PRN

## 2021-02-19 NOTE — ED Provider Notes (Signed)
[facility] EMERGENCY DEPARTMENT Provider Note   CSN: [id number] Arrival date & time: 02/18/21  1853     History Chief Complaint  Patient presents with   Hip Pain    TARQUIN WELCHER is a 50 y.o. male.  The history is provided by the patient and medical records. No language interpreter was used.  Hip Pain Associated symptoms include abdominal pain. Pertinent negatives include no chest pain, no headaches and no shortness of breath.  Abdominal Pain Pain location:  Generalized Pain quality: aching   Pain radiates to:  Back and R flank Pain severity:  Severe Onset quality:  Gradual Timing:  Constant Progression:  Worsening Chronicity:  New Context: trauma (several weeks ago)   Worsened by:  Palpation, position changes and movement Ineffective treatments:  None tried Associated symptoms: no chest pain, no chills, no constipation, no cough, no diarrhea, no dysuria, no fatigue, no fever, no nausea, no shortness of breath and no vomiting       Past Medical History:  Diagnosis Date   Arthritis    Asthma    Gout    Rib fracture     Patient Active Problem List   Diagnosis Date Noted   Traumatic rhabdomyolysis (HCC)    Elevated troponin    AKI (acute kidney injury) (HCC) 01/26/2021   Elevated LFTs 01/26/2021   Alcohol abuse 01/26/2021   Polysubstance abuse (HCC) 01/26/2021   Diarrhea 01/26/2021    History reviewed. No pertinent surgical history.     No family history on file.  Social History   Tobacco Use   Smoking status: Every Day    Packs/day: 0.20    Types: Cigarettes  Substance Use Topics   Alcohol use: Yes    Alcohol/week: 15.0 standard drinks    Types: 15 Cans of beer per week    Comment: daily   Drug use: Yes    Types: Marijuana, Cocaine, "Crack" cocaine    Comment: "crack sprinkled on top of tobacco"     Home Medications Prior to Admission medications   Medication Sig Start Date End Date Taking? Authorizing Provider   albuterol (VENTOLIN HFA) 108 (90 Base) MCG/ACT inhaler Inhale 2 puffs into the lungs every 4 (four) hours as needed for wheezing or shortness of breath. 02/06/21   Dorcas Carrow, MD  amLODipine (NORVASC) 5 MG tablet Take 1 tablet (5 mg total) by mouth daily. 02/06/21 03/08/21  Dorcas Carrow, MD  nicotine (NICODERM CQ - DOSED IN MG/24 HOURS) 21 mg/24hr patch Place 1 patch (21 mg total) onto the skin as needed (nicotine withdrawal). 02/06/21   Dorcas Carrow, MD  oxyCODONE (OXY IR/ROXICODONE) 5 MG immediate release tablet Take 1 tablet (5 mg total) by mouth every 6 (six) hours as needed for moderate pain or breakthrough pain. 02/06/21   Dorcas Carrow, MD    Allergies    Other  Review of Systems   Review of Systems  Constitutional:  Negative for chills, fatigue and fever.  HENT:  Negative for congestion.   Eyes:  Negative for visual disturbance.  Respiratory:  Negative for cough, chest tightness and shortness of breath.   Cardiovascular:  Negative for chest pain and palpitations.  Gastrointestinal:  Positive for abdominal pain. Negative for constipation, diarrhea, nausea and vomiting.  Genitourinary:  Positive for flank pain and frequency. Negative for dysuria.  Skin:  Negative for rash and wound.  Neurological:  Positive for numbness (tingling in R leg similar to recent admt). Negative for headaches.  Psychiatric/Behavioral:  Negative for agitation.   All other systems reviewed and are negative.  Physical Exam Updated Vital Signs BP 140/77 (BP Location: Right Arm)   Pulse 74   Temp 98.3 F (36.8 C) (Oral)   Resp 17   SpO2 98%   Physical Exam Vitals and nursing note reviewed.  Constitutional:      General: He is not in acute distress.    Appearance: He is well-developed. He is not ill-appearing, toxic-appearing or diaphoretic.  HENT:     Head: Normocephalic and atraumatic.     Mouth/Throat:     Mouth: Mucous membranes are moist.  Eyes:     Extraocular Movements:  Extraocular movements intact.     Conjunctiva/sclera: Conjunctivae normal.     Pupils: Pupils are equal, round, and reactive to light.  Cardiovascular:     Rate and Rhythm: Normal rate and regular rhythm.     Heart sounds: No murmur heard. Pulmonary:     Effort: Pulmonary effort is normal. No respiratory distress.     Breath sounds: Normal breath sounds. No stridor. No wheezing, rhonchi or rales.  Chest:     Chest wall: No tenderness.  Abdominal:     General: Abdomen is flat.     Palpations: Abdomen is soft.     Tenderness: There is no abdominal tenderness. There is no right CVA tenderness, left CVA tenderness, guarding or rebound.  Musculoskeletal:        General: Tenderness and signs of injury present.     Cervical back: Neck supple.  Skin:    General: Skin is warm and dry.     Capillary Refill: Capillary refill takes less than 2 seconds.     Findings: No erythema or rash.  Neurological:     Mental Status: He is alert.     Sensory: Sensory deficit (tingling in r Leg) present.     Motor: No weakness.  Psychiatric:        Mood and Affect: Mood normal.    ED Results / Procedures / Treatments   Labs (all labs ordered are listed, but only abnormal results are displayed) Labs Reviewed  BASIC METABOLIC PANEL - Abnormal; Notable for the following components:      Result Value   Glucose, Bld 107 (*)    Creatinine, Ser 1.56 (*)    GFR, Estimated 53 (*)    All other components within normal limits  CBC - Abnormal; Notable for the following components:   RBC 2.71 (*)    Hemoglobin 8.9 (*)    HCT 26.9 (*)    Platelets 404 (*)    All other components within normal limits  CBC WITH DIFFERENTIAL/PLATELET - Abnormal; Notable for the following components:   RBC 2.81 (*)    Hemoglobin 9.1 (*)    HCT 28.2 (*)    MCV 100.4 (*)    Platelets 430 (*)    All other components within normal limits  COMPREHENSIVE METABOLIC PANEL - Abnormal; Notable for the following components:    Creatinine, Ser 1.42 (*)    GFR, Estimated 59 (*)    All other components within normal limits  LIPASE, BLOOD - Abnormal; Notable for the following components:   Lipase 81 (*)    All other components within normal limits  URINE CULTURE  URINALYSIS, ROUTINE W REFLEX MICROSCOPIC  LACTIC ACID, PLASMA  CK  LACTIC ACID, PLASMA    EKG None  Radiology DG Ankle Complete Right  Result Date: 02/19/2021 CLINICAL DATA:  Ankle pain, fell  a few weeks ago EXAM: RIGHT ANKLE - COMPLETE 3+ VIEW COMPARISON:  None. FINDINGS: There is no acute fracture or dislocation. Bony alignment is normal. The ankle mortise is intact. The soft tissues are unremarkable. IMPRESSION: No acute fracture or dislocation. Electronically Signed   By: Lesia Hausen M.D.   On: 02/19/2021 08:11   CT ABDOMEN PELVIS W CONTRAST  Result Date: 02/19/2021 CLINICAL DATA:  Worsening pain EXAM: CT ABDOMEN AND PELVIS WITH CONTRAST TECHNIQUE: Multidetector CT imaging of the abdomen and pelvis was performed using the standard protocol following bolus administration of intravenous contrast. CONTRAST:  OMNIPAQUE IOHEXOL 350 MG/ML SOLN COMPARISON:  02/01/2021 FINDINGS: Lower chest: New cardiomegaly and new pericardial effusion. Pleural effusions have resolved. Hepatobiliary: No focal liver abnormality is seen. No gallstones, gallbladder wall thickening, or biliary dilatation. Pancreas: Unremarkable. No pancreatic ductal dilatation or surrounding inflammatory changes. Spleen: Normal in size without focal abnormality. Adrenals/Urinary Tract: 1 cm focal nodularity of the left adrenal is present on prior noncontrast study. Kidneys are unremarkable. Bladder is unremarkable. Stomach/Bowel: Stomach is within normal limits. Bowel is normal in caliber. Normal appendix. Vascular/Lymphatic: Mild aortic atherosclerosis. No enlarged lymph nodes. Reproductive: Unremarkable. Other: No fluid collections. Musculoskeletal: Decreased edema in the right gluteal and  upper thigh region. This is better evaluated on concurrent dedicated imaging. No acute osseous abnormality. IMPRESSION: New cardiomegaly and pericardial effusion. Resolution of pleural effusions. Indeterminate 1 cm nodule of left adrenal. This can be evaluated as an outpatient. Right gluteal/thigh findings discussed separately. Electronically Signed   By: Guadlupe Spanish M.D.   On: 02/19/2021 11:56   CT FEMUR RIGHT W CONTRAST  Result Date: 02/19/2021 CLINICAL DATA:  Right thigh mass.  History of trauma. EXAM: CT OF THE LOWER RIGHT EXTREMITY WITH CONTRAST TECHNIQUE: Multidetector CT imaging of the lower right extremity was performed according to the standard protocol following intravenous contrast administration. CONTRAST:  OMNIPAQUE IOHEXOL 350 MG/ML SOLN COMPARISON:  Prior CT scan 02/01/2021 and MRI 01/27/2021 FINDINGS: Resolution of the subcutaneous edema seen on the prior CT scan. No obvious intramuscular hematoma or mass. The right femur is intact. No femur fracture or bone lesion. The major vascular structures are patent. IMPRESSION: Unremarkable CT examination of the right thigh. Electronically Signed   By: Rudie Meyer M.D.   On: 02/19/2021 12:44   DG Hip Unilat  With Pelvis 2-3 Views Right  Result Date: 02/18/2021 CLINICAL DATA:  Status post fall. EXAM: DG HIP (WITH OR WITHOUT PELVIS) 2-3V RIGHT COMPARISON:  None. FINDINGS: There is no evidence of hip fracture or dislocation. There is no evidence of arthropathy or other focal bone abnormality. Multiple phleboliths are seen within the lower pelvis. IMPRESSION: Negative. Electronically Signed   By: Aram Candela M.D.   On: 02/18/2021 19:50    Procedures Procedures   Medications Ordered in ED Medications  fentaNYL (SUBLIMAZE) injection 50 mcg (50 mcg Intravenous Given 02/19/21 0853)  fentaNYL (SUBLIMAZE) injection 50 mcg (50 mcg Intravenous Given 02/19/21 1034)  iohexol (OMNIPAQUE) 350 MG/ML injection 100 mL (100 mLs Intravenous  Contrast Given 02/19/21 1145)    ED Course  I have reviewed the triage vital signs and the nursing notes.  Pertinent labs & imaging results that were available during my care of the patient were reviewed by me and considered in my medical decision making (see chart for details).    MDM Rules/Calculators/A&P  WILBURT FLEITES is a 50 y.o. male with a past medical history significant for polysubstance abuse, asthma, and recent admission 2 weeks ago for acute kidney injury related to traumatic rhabdomyolysis, traumatic injuries including hemoperitoneum with multiple muscle tears with intra muscle hemorrhage versus myonecrosis who presents with worsened pain in his right flank, back, abdomen, right hip, right thigh, and right ankle.  He reports that he was discharged in the hospital about a week ago after a 9-day stay where he was found to have multiple injuries from a fall with subsequent traumatic rhabdo.  He reports he was discharged home but over the last few days has had worsening symptoms and is having difficulty walking on the right leg due to worsened pain in his back abdomen and right upper leg.  He also reports pain in his ankle.  Ankle x-ray previously did not show acute fracture.  Patient reports the pain is severe and he also reports continued tingling in his right leg which it appears she was complaining of during his previous admission.  He denies any new fevers, chills, chest pain, shortness of breath, nausea, vomiting, constipation, or diarrhea.  He reports he does have abdominal pain with bowel movements and reports he has urinary frequency.  On exam, abdomen is diffusely tender.  Back is diffusely tender in the lower back and the CVA areas.  Patient has tenderness in his right thigh he reports he feels like there is a new knot developing in the distal thigh.  Ankle is tender to palpation but he has intact strength and has sensation in both legs although he  reports some tingling in the toes.  Otherwise lungs clear and chest nontender.  Upper back nontender.  Good pulses palpable in upper extremities.  Given his recent admission, I am concerned that he may have evolution or worsened rhabdo versus intramuscular bleeding versus other injuries.  Patient was seen approximately 13 hours ago when he first checked in and he is still feeling worse.  We will get a repeat hemoglobin as hemoglobin appears to downtrend and since his discharge.  We will also get further lab work including a CK and although the x-ray of the right hip was reassuring from a bony standpoint, most of his issues appear to be more muscular in nature so we will get CTs to further evaluate as well as evaluate his abdomen where he had a hemoperitoneum recently.  Anticipate reassessment after work-up to determine disposition.  2:38 PM Work-up returned showing improvement compared to recent admission findings.  Hemoglobin had slowly decreased but on repeat this morning appears stable.  No leukocytosis.  Creatinine is improved.  Urinalysis does not show evidence of infection and lactic acid not elevated.  CK is not elevated, no rhabdo.  Electrolytes reassuring.  CT scan of the abdomen pelvis and femur did not show evidence of new hematoma or evidence of myositis.  Improvement in the pleural effusions but he does have a pericardial effusion.  Patient denies any chest pain or shortness at this time and I told him about the new finding to follow-up with his PCP about.  Otherwise imaging reassuring.  Suspect continued muscle pains after the previous injury.  He reports pain medicine is working in the past we will give a short prescription for pain medication.  He will follow-up with his PCP and orthopedist for further management.  He no questions or concerns and was discharged in good condition.   Final Clinical Impression(s) / ED Diagnoses Final diagnoses:  Right sided abdominal pain  Right leg  pain    Clinical Impression: 1. Right sided abdominal pain   2. Right leg pain     Disposition: Discharge  Condition: Good  I have discussed the results, Dx and Tx plan with the pt(& family if present). He/she/they expressed understanding and agree(s) with the plan. Discharge instructions discussed at great length. Strict return precautions discussed and pt &/or family have verbalized understanding of the instructions. No further questions at time of discharge.    New Prescriptions   OXYCODONE-ACETAMINOPHEN (PERCOCET/ROXICET) 5-325 MG TABLET    Take 1 tablet by mouth every 6 (six) hours as needed for severe pain.    Follow Up: your orthopedics team     [facility] [address] [place] [address] [phone] Schedule an appointment as soon as possible for a visit    [facility] EMERGENCY DEPARTMENT [id number] [place] [address] [phone]        Breezie Micucci, Canary Brim, MD 02/19/21 [phone]

## 2021-02-19 NOTE — ED Notes (Signed)
Pt verbalized understanding of discharge instructions; opportunity for questions provided °

## 2021-02-19 NOTE — Discharge Instructions (Signed)
Your history, exam and work-up today did not reveal any other acute cause of your right-sided pain in the leg, back, abdomen.  The CT scans all appeared improved from your recent admission.  We did not see any evidence of new hematomas.  I suspect you are still having muscle pains from the previous injury so please use the pain medicine to help with discomfort and rest for the next 2 days.  The CT scan did show a small amount of fluid near your heart but the fluid around the lungs was improved from before.  Please follow-up with your primary doctor and orthopedist for further management.  If any symptoms change or worsen acutely, please return to the nearest emergency department.

## 2021-02-20 LAB — URINE CULTURE: Culture: NO GROWTH

## 2021-04-14 ENCOUNTER — Encounter (HOSPITAL_COMMUNITY): Payer: Self-pay

## 2021-04-14 ENCOUNTER — Emergency Department (HOSPITAL_COMMUNITY)
Admission: EM | Admit: 2021-04-14 | Discharge: 2021-04-14 | Disposition: A | Discharge status: Home / Self Care | Payer: Medicaid Other | Attending: Emergency Medicine | Admitting: Emergency Medicine

## 2021-04-14 ENCOUNTER — Other Ambulatory Visit: Payer: Self-pay

## 2021-04-14 DIAGNOSIS — Z79899 Other long term (current) drug therapy: Secondary | ICD-10-CM | POA: Insufficient documentation

## 2021-04-14 DIAGNOSIS — M79604 Pain in right leg: Secondary | ICD-10-CM | POA: Insufficient documentation

## 2021-04-14 DIAGNOSIS — R202 Paresthesia of skin: Secondary | ICD-10-CM | POA: Insufficient documentation

## 2021-04-14 DIAGNOSIS — F17210 Nicotine dependence, cigarettes, uncomplicated: Secondary | ICD-10-CM | POA: Insufficient documentation

## 2021-04-14 DIAGNOSIS — R52 Pain, unspecified: Secondary | ICD-10-CM

## 2021-04-14 DIAGNOSIS — J45909 Unspecified asthma, uncomplicated: Secondary | ICD-10-CM | POA: Insufficient documentation

## 2021-04-14 LAB — CBC WITH DIFFERENTIAL/PLATELET
Abs Immature Granulocytes: 0.01 10*3/uL (ref 0.00–0.07)
Basophils Absolute: 0 10*3/uL (ref 0.0–0.1)
Basophils Relative: 0 %
Eosinophils Absolute: 0.3 10*3/uL (ref 0.0–0.5)
Eosinophils Relative: 3 %
HCT: 35.8 % — ABNORMAL LOW (ref 39.0–52.0)
Hemoglobin: 12.1 g/dL — ABNORMAL LOW (ref 13.0–17.0)
Immature Granulocytes: 0 %
Lymphocytes Relative: 45 %
Lymphs Abs: 4 10*3/uL (ref 0.7–4.0)
MCH: 33 pg (ref 26.0–34.0)
MCHC: 33.8 g/dL (ref 30.0–36.0)
MCV: 97.5 fL (ref 80.0–100.0)
Monocytes Absolute: 0.9 10*3/uL (ref 0.1–1.0)
Monocytes Relative: 10 %
Neutro Abs: 3.8 10*3/uL (ref 1.7–7.7)
Neutrophils Relative %: 42 %
Platelets: 342 10*3/uL (ref 150–400)
RBC: 3.67 MIL/uL — ABNORMAL LOW (ref 4.22–5.81)
RDW: 13.9 % (ref 11.5–15.5)
WBC: 9 10*3/uL (ref 4.0–10.5)
nRBC: 0 % (ref 0.0–0.2)

## 2021-04-14 LAB — COMPREHENSIVE METABOLIC PANEL
ALT: 13 U/L (ref 0–44)
AST: 19 U/L (ref 15–41)
Albumin: 4.2 g/dL (ref 3.5–5.0)
Alkaline Phosphatase: 47 U/L (ref 38–126)
Anion gap: 8 (ref 5–15)
BUN: 24 mg/dL — ABNORMAL HIGH (ref 6–20)
CO2: 26 mmol/L (ref 22–32)
Calcium: 9.1 mg/dL (ref 8.9–10.3)
Chloride: 104 mmol/L (ref 98–111)
Creatinine, Ser: 1.19 mg/dL (ref 0.61–1.24)
GFR, Estimated: >60 mL/min (ref >60)
Glucose, Bld: 105 mg/dL — ABNORMAL HIGH (ref 70–99)
Potassium: 4 mmol/L (ref 3.5–5.1)
Sodium: 138 mmol/L (ref 135–145)
Total Bilirubin: 0.5 mg/dL (ref 0.3–1.2)
Total Protein: 7.3 g/dL (ref 6.5–8.1)

## 2021-04-14 MED ORDER — METHOCARBAMOL 500 MG PO TABS: 500.0000 mg | ORAL_TABLET | Freq: Two times a day (BID) | ORAL | 0 refills | Status: DC

## 2021-04-14 MED ORDER — GABAPENTIN 300 MG PO CAPS: 300.0000 mg | ORAL_CAPSULE | Freq: Three times a day (TID) | ORAL | 0 refills | Status: DC

## 2021-04-14 MED ORDER — DICLOFENAC SODIUM 1 % EX GEL: 2.0000 g | Freq: Four times a day (QID) | CUTANEOUS | 0 refills | Status: DC

## 2021-04-14 NOTE — ED Triage Notes (Signed)
Pt reports a pins and needle feeling in his left foot since last night. Pt states that he is pre diabetic.

## 2021-04-14 NOTE — ED Provider Notes (Signed)
[facility]-EMERGENCY DEPT Provider Note  CSN: [id number] Arrival date & time: 04/14/21 0335  Chief Complaint(s) Foot Tingling   HPI Frank [name] is a 50 y.o. male here for persistent right leg tingling related to traumatic injury on October 4.  He reports that at that time the injury was caused by him trying to climb over an 8 foot electric fence while intoxicated.  He reports falling from the top onto his right side.  He was admitted at that time and found to be in renal failure from rhabdomyolysis.  Patient was previously treated with Flexeril all and oxycodone.  Reports that he has not been able to follow-up with a primary care provider due to insurance constraints.  Reports that he now has insurance.  He denies any acute changes with the tingling.  Reports that he is ready to follow-up to get this fixed so he can go back to work.  He reports that the tingling is in the foot region associated with burning sensation, worse with ambulation.  He does report spastic right leg cramps.  Denies any recent falls or trauma..  No leg swelling.  No redness.  No other physical complaints.  HPI  Past Medical History Past Medical History:  Diagnosis Date   Arthritis    Asthma    Gout    Rib fracture    Patient Active Problem List   Diagnosis Date Noted   Traumatic rhabdomyolysis (HCC)    Elevated troponin    AKI (acute kidney injury) (HCC) 01/26/2021   Elevated LFTs 01/26/2021   Alcohol abuse 01/26/2021   Polysubstance abuse (HCC) 01/26/2021   Diarrhea 01/26/2021   Home Medication(s) Prior to Admission medications   Medication Sig Start Date End Date Taking? Authorizing Provider  diclofenac Sodium (VOLTAREN) 1 % GEL Apply 2 g topically 4 (four) times daily. 04/14/21  Yes Ziyad Dyar, Amadeo Garnet, MD  gabapentin (NEURONTIN) 300 MG capsule Take 1 capsule (300 mg total) by mouth 3 (three) times daily. 04/14/21 05/14/21 Yes Ellason Segar, Amadeo Garnet, MD  methocarbamol  (ROBAXIN) 500 MG tablet Take 1 tablet (500 mg total) by mouth 2 (two) times daily. 04/14/21  Yes Rahmon Heigl, Amadeo Garnet, MD  albuterol (VENTOLIN HFA) 108 (90 Base) MCG/ACT inhaler Inhale 2 puffs into the lungs every 4 (four) hours as needed for wheezing or shortness of breath. 02/06/21   Dorcas Carrow, MD  amLODipine (NORVASC) 5 MG tablet Take 1 tablet (5 mg total) by mouth daily. 02/06/21 03/08/21  Dorcas Carrow, MD  nicotine (NICODERM CQ - DOSED IN MG/24 HOURS) 21 mg/24hr patch Place 1 patch (21 mg total) onto the skin as needed (nicotine withdrawal). Patient not taking: Reported on 02/19/2021 02/06/21   Dorcas Carrow, MD  oxyCODONE (OXY IR/ROXICODONE) 5 MG immediate release tablet Take 1 tablet (5 mg total) by mouth every 6 (six) hours as needed for moderate pain or breakthrough pain. Patient taking differently: Take 5-10 mg by mouth every 6 (six) hours as needed for moderate pain or breakthrough pain. 02/06/21   Dorcas Carrow, MD  oxyCODONE-acetaminophen (PERCOCET/ROXICET) 5-325 MG tablet Take 1 tablet by mouth every 6 (six) hours as needed for severe pain. 02/19/21   Tegeler, Canary Brim, MD  Past Surgical History History reviewed. No pertinent surgical history. Family History History reviewed. No pertinent family history.  Social History Social History   Tobacco Use   Smoking status: Every Day    Packs/day: 0.20    Types: Cigarettes  Substance Use Topics   Alcohol use: Yes    Alcohol/week: 15.0 standard drinks    Types: 15 Cans of beer per week    Comment: daily   Drug use: Yes    Types: Marijuana, Cocaine, "Crack" cocaine    Comment: "crack sprinkled on top of tobacco"    Allergies Other  Review of Systems Review of Systems All other systems are reviewed and are negative for acute change except as noted in the HPI  Physical Exam Vital  Signs  I have reviewed the triage vital signs BP 114/74    Pulse 86    Temp 98.5 F (36.9 C) (Oral)    Resp (!) 22    Ht  (1.727 m)    Wt 68 kg    SpO2 96%    BMI 22.81 kg/m   Physical Exam Vitals reviewed.  Constitutional:      General: He is not in acute distress.    Appearance: He is well-developed. He is not diaphoretic.  HENT:     Head: Normocephalic and atraumatic.     Right Ear: External ear normal.     Left Ear: External ear normal.     Nose: Nose normal.     Mouth/Throat:     Mouth: Mucous membranes are moist.  Eyes:     General: No scleral icterus.    Conjunctiva/sclera: Conjunctivae normal.  Neck:     Trachea: Phonation normal.  Cardiovascular:     Rate and Rhythm: Normal rate and regular rhythm.  Pulmonary:     Effort: Pulmonary effort is normal. No respiratory distress.     Breath sounds: No stridor.  Abdominal:     General: There is no distension.  Musculoskeletal:        General: Normal range of motion.     Cervical back: Normal range of motion.     Right lower leg: No tenderness or bony tenderness.     Left lower leg: No tenderness.     Right foot: Normal range of motion and normal capillary refill. No swelling, deformity, tenderness or bony tenderness. Normal pulse.     Left foot: Normal capillary refill. No swelling, deformity, tenderness or bony tenderness. Normal pulse.  Neurological:     Mental Status: He is alert and oriented to person, place, and time.  Psychiatric:        Behavior: Behavior normal.    ED Results and Treatments Labs (all labs ordered are listed, but only abnormal results are displayed) Labs Reviewed  CBC WITH DIFFERENTIAL/PLATELET - Abnormal; Notable for the following components:      Result Value   RBC 3.67 (*)    Hemoglobin 12.1 (*)    HCT 35.8 (*)    All other components within normal limits  COMPREHENSIVE METABOLIC PANEL - Abnormal; Notable for the following components:   Glucose, Bld 105 (*)    BUN 24 (*)    All  other components within normal limits  EKG  EKG Interpretation  Date/Time:    Ventricular Rate:    PR Interval:    QRS Duration:   QT Interval:    QTC Calculation:   R Axis:     Text Interpretation:         Radiology No results found.  Pertinent labs & imaging results that were available during my care of the patient were reviewed by me and considered in my medical decision making (see MDM for details).  Medications Ordered in ED Medications - No data to display                                                                                                                                   Procedures Procedures  (including critical care time)  Medical Decision Making / ED Course I have reviewed the nursing notes for this encounter and the patient's prior records (if available in EHR or on provided paperwork).  Frank [name] was evaluated in Emergency Department on 04/14/2021 for the symptoms described in the history of present illness. He was evaluated in the context of the global COVID-19 pandemic, which necessitated consideration that the patient might be at risk for infection with the SARS-CoV-2 virus that causes COVID-19. Institutional protocols and algorithms that pertain to the evaluation of patients at risk for COVID-19 are in a state of rapid change based on information released by regulatory bodies including the CDC and federal and state organizations. These policies and algorithms were followed during the patient's care in the ED.     Chronic paresthesia from traumatic injury in Oct. No new changes. No evidence of infectious process, DVT, or arterial occlusion. Likely neuropraxia versus complex regional pain syndrome from previous injury. Labs grossly reassuring without leukocytosis or anemia.  No significant electrolyte derangements or renal  sufficiency. No need for repeat imaging or work-up at this time. Recommend establishing care with a primary care provider and follow-up with neurology. Will Rx Voltaren, Robaxin, and gabapentin.  Pertinent labs & imaging results that were available during my care of the patient were reviewed by me and considered in my medical decision making:    Final Clinical Impression(s) / ED Diagnoses Final diagnoses:  Tingling pain   The patient appears reasonably screened and/or stabilized for discharge and I doubt any other medical condition or other [facility] requiring further screening, evaluation, or treatment in the ED at this time prior to discharge. Safe for discharge with strict return precautions.  Disposition: Discharge  Condition: Good  I have discussed the results, Dx and Tx plan with the patient/family who expressed understanding and agree(s) with the plan. Discharge instructions discussed at length. The patient/family was given strict return precautions who verbalized understanding of the instructions. No further questions at time of discharge.    ED Discharge Orders          Ordered    methocarbamol (ROBAXIN) 500 MG tablet  2 times daily  04/14/21 0509    diclofenac Sodium (VOLTAREN) 1 % GEL  4 times daily        04/14/21 0509    gabapentin (NEURONTIN) 300 MG capsule  3 times daily        04/14/21 3818            Follow Up: Primary care provider  Call  if you do not have a primary care physician, contact [facility] at [phone] for referral  [facility] [address]     [address] [place] [address] [phone] Call  to schedule an appointment for close follow up     This chart was dictated using voice recognition software.  Despite best efforts to proofread,  errors can occur which can change the documentation meaning.    Nira Conn, MD 04/14/21 [phone]

## 2021-05-06 ENCOUNTER — Encounter (HOSPITAL_COMMUNITY): Payer: Self-pay

## 2021-05-06 ENCOUNTER — Emergency Department (HOSPITAL_COMMUNITY): Payer: Commercial Managed Care - HMO

## 2021-05-06 ENCOUNTER — Other Ambulatory Visit: Payer: Self-pay

## 2021-05-06 ENCOUNTER — Emergency Department (HOSPITAL_COMMUNITY)
Admission: EM | Admit: 2021-05-06 | Discharge: 2021-05-06 | Disposition: A | Discharge status: Home / Self Care | Payer: Commercial Managed Care - HMO | Attending: Emergency Medicine | Admitting: Emergency Medicine

## 2021-05-06 DIAGNOSIS — R42 Dizziness and giddiness: Secondary | ICD-10-CM | POA: Diagnosis not present

## 2021-05-06 DIAGNOSIS — R55 Syncope and collapse: Secondary | ICD-10-CM | POA: Diagnosis present

## 2021-05-06 DIAGNOSIS — R1110 Vomiting, unspecified: Secondary | ICD-10-CM | POA: Insufficient documentation

## 2021-05-06 HISTORY — DX: Disorder of kidney and ureter, unspecified: N28.9

## 2021-05-06 LAB — TROPONIN I (HIGH SENSITIVITY)
Troponin I (High Sensitivity): 2 ng/L (ref <18)
Troponin I (High Sensitivity): 4 ng/L (ref <18)

## 2021-05-06 LAB — RAPID URINE DRUG SCREEN, HOSP PERFORMED
Amphetamines: NOT DETECTED
Barbiturates: NOT DETECTED
Benzodiazepines: NOT DETECTED
Cocaine: NOT DETECTED
Opiates: NOT DETECTED
Tetrahydrocannabinol: POSITIVE — AB

## 2021-05-06 LAB — CBC
HCT: 37.8 % — ABNORMAL LOW (ref 39.0–52.0)
Hemoglobin: 12.7 g/dL — ABNORMAL LOW (ref 13.0–17.0)
MCH: 33 pg (ref 26.0–34.0)
MCHC: 33.6 g/dL (ref 30.0–36.0)
MCV: 98.2 fL (ref 80.0–100.0)
Platelets: 301 10*3/uL (ref 150–400)
RBC: 3.85 MIL/uL — ABNORMAL LOW (ref 4.22–5.81)
RDW: 13.4 % (ref 11.5–15.5)
WBC: 11.9 10*3/uL — ABNORMAL HIGH (ref 4.0–10.5)
nRBC: 0 % (ref 0.0–0.2)

## 2021-05-06 LAB — URINALYSIS, ROUTINE W REFLEX MICROSCOPIC
Bacteria, UA: NONE SEEN
Bilirubin Urine: NEGATIVE
Glucose, UA: NEGATIVE mg/dL
Hgb urine dipstick: NEGATIVE
Ketones, ur: NEGATIVE mg/dL
Leukocytes,Ua: NEGATIVE
Nitrite: NEGATIVE
Protein, ur: NEGATIVE mg/dL
Specific Gravity, Urine: 1.018 (ref 1.005–1.030)
pH: 5 (ref 5.0–8.0)

## 2021-05-06 LAB — BASIC METABOLIC PANEL
Anion gap: 6 (ref 5–15)
BUN: 18 mg/dL (ref 6–20)
CO2: 28 mmol/L (ref 22–32)
Calcium: 9.4 mg/dL (ref 8.9–10.3)
Chloride: 104 mmol/L (ref 98–111)
Creatinine, Ser: 1.26 mg/dL — ABNORMAL HIGH (ref 0.61–1.24)
GFR, Estimated: >60 mL/min (ref >60)
Glucose, Bld: 88 mg/dL (ref 70–99)
Potassium: 4 mmol/L (ref 3.5–5.1)
Sodium: 138 mmol/L (ref 135–145)

## 2021-05-06 LAB — LIPASE, BLOOD: Lipase: 39 U/L (ref 11–51)

## 2021-05-06 LAB — CBG MONITORING, ED: Glucose-Capillary: 85 mg/dL (ref 70–99)

## 2021-05-06 IMAGING — DX DG CHEST 1V PORT
1 series · 1 of 1 positions shown · non-contrast
Comparison: [DATE]

CLINICAL DATA: Syncopal episode today.

EXAM:
PORTABLE CHEST 1 VIEW

[chest ap]
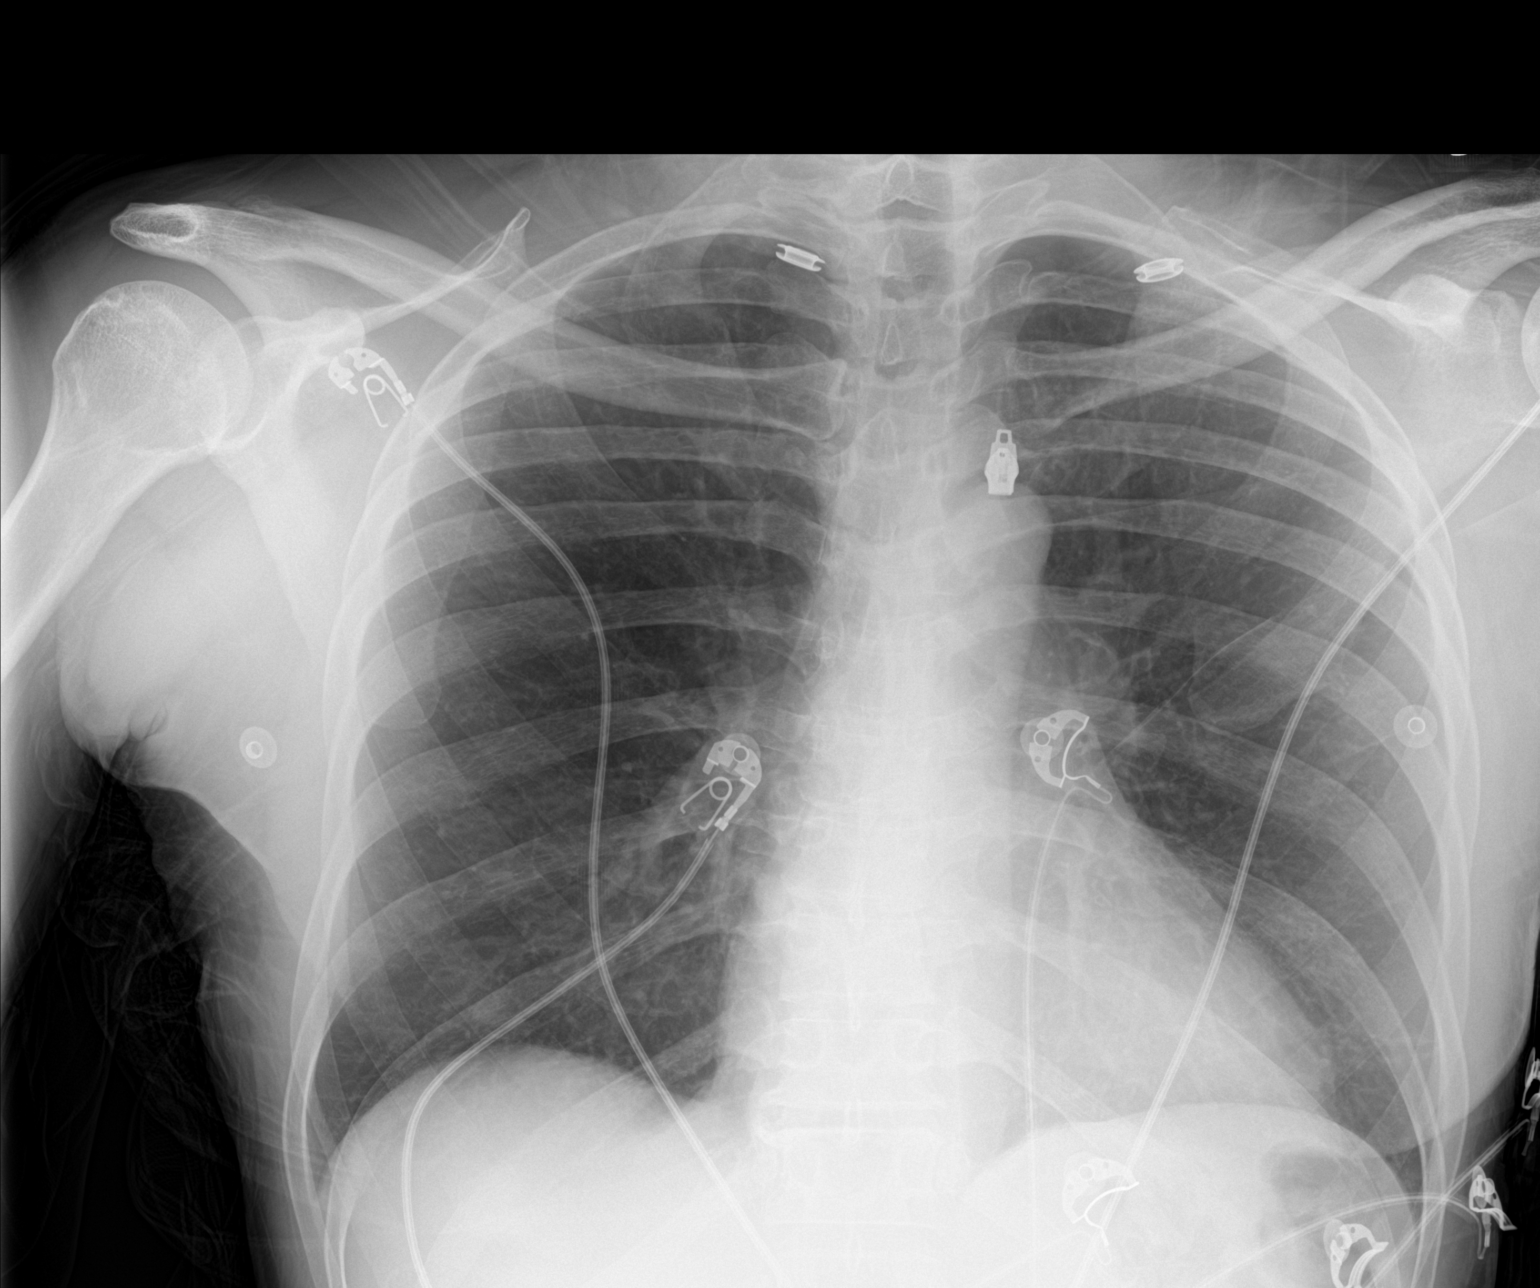

[1 of 1 positions shown; findings below may reference images not displayed]

FINDINGS: Artifact overlies the chest. Heart and mediastinal shadows are
normal. The lungs are clear. The vascularity is normal. No
effusions. No abnormal bone finding.
IMPRESSION: No active disease.

## 2021-05-06 NOTE — Discharge Instructions (Signed)
The test today did not show any serious problems.  Continue taking your usual medications.  Follow-up with your doctor for checkup in a week or 2.

## 2021-05-06 NOTE — ED Triage Notes (Signed)
PER EMS: [facility] from [facility], [facility], had syncopal episode today about 30 mins prior to arrival after vomiting. LOC approx 30 seconds. Initial BP 90/60, given 400 cc NaCl and now BP is 122/86. CBG-143, HR-60. 4mg  zofan given IV en route. A&Ox4 now.

## 2021-05-06 NOTE — ED Provider Notes (Signed)
[facility] DEPT Provider Note   CSN: [id number] Arrival date & time: 05/06/21  1601     History  Chief Complaint  Patient presents with   Loss of Consciousness    Frank [name] is a 51 y.o. male.  HPI He presents by EMS for evaluation of vomiting and dizziness.  He states that he did not eat breakfast this morning like usual, later drank a beer, followed by a health beverage, then vomited.  He reports after the vomiting he felt dizziness.  He denies chest pain, shortness of breath, focal weakness or paresthesia.  EMS found him to be hypotensive, blood pressure 90/60 and he was treated with normal saline 400 cc by IV.  This improved his blood pressure to 122/86.  Patient denies recent fever, chills, cough, shortness of breath, focal weakness or paresthesia.    Home Medications Prior to Admission medications   Medication Sig Start Date End Date Taking? Authorizing Provider  albuterol (VENTOLIN HFA) 108 (90 Base) MCG/ACT inhaler Inhale 2 puffs into the lungs every 4 (four) hours as needed for wheezing or shortness of breath. 02/06/21   Barb Merino, MD  amLODipine (NORVASC) 5 MG tablet Take 1 tablet (5 mg total) by mouth daily. 02/06/21 03/08/21  Barb Merino, MD  diclofenac Sodium (VOLTAREN) 1 % GEL Apply 2 g topically 4 (four) times daily. 04/14/21   Fatima Blank, MD  gabapentin (NEURONTIN) 300 MG capsule Take 1 capsule (300 mg total) by mouth 3 (three) times daily. 04/14/21 05/14/21  Fatima Blank, MD  methocarbamol (ROBAXIN) 500 MG tablet Take 1 tablet (500 mg total) by mouth 2 (two) times daily. 04/14/21   Fatima Blank, MD  nicotine (NICODERM CQ - DOSED IN MG/24 HOURS) 21 mg/24hr patch Place 1 patch (21 mg total) onto the skin as needed (nicotine withdrawal). Patient not taking: Reported on 02/19/2021 02/06/21   Barb Merino, MD  oxyCODONE (OXY IR/ROXICODONE) 5 MG immediate release tablet Take 1 tablet (5 mg total) by  mouth every 6 (six) hours as needed for moderate pain or breakthrough pain. Patient taking differently: Take 5-10 mg by mouth every 6 (six) hours as needed for moderate pain or breakthrough pain. 02/06/21   Barb Merino, MD  oxyCODONE-acetaminophen (PERCOCET/ROXICET) 5-325 MG tablet Take 1 tablet by mouth every 6 (six) hours as needed for severe pain. 02/19/21   Tegeler, Gwenyth Allegra, MD      Allergies    Banana and Other    Review of Systems   Review of Systems  All other systems reviewed and are negative.  Physical Exam Updated Vital Signs BP 118/79    Pulse (!) 57    Temp 97.9 F (36.6 C) (Oral)    Resp 12    Ht 5\' 8"  (1.727 m)    Wt 68 kg    SpO2 96%    BMI 22.81 kg/m  Physical Exam Vitals and nursing note reviewed.  Constitutional:      General: He is not in acute distress.    Appearance: He is well-developed. He is not ill-appearing or diaphoretic.  HENT:     Head: Normocephalic and atraumatic.     Right Ear: External ear normal.     Left Ear: External ear normal.  Eyes:     Conjunctiva/sclera: Conjunctivae normal.     Pupils: Pupils are equal, round, and reactive to light.  Neck:     Trachea: Phonation normal.  Cardiovascular:     Rate and Rhythm: Normal  rate and regular rhythm.     Heart sounds: Normal heart sounds. No murmur heard. Pulmonary:     Effort: Pulmonary effort is normal.     Breath sounds: Normal breath sounds.  Abdominal:     General: There is no distension.     Palpations: Abdomen is soft.     Tenderness: There is no abdominal tenderness.  Musculoskeletal:        General: No swelling or tenderness. Normal range of motion.     Cervical back: Normal range of motion and neck supple.  Skin:    General: Skin is warm and dry.  Neurological:     Mental Status: He is alert and oriented to person, place, and time.     Cranial Nerves: No cranial nerve deficit.     Sensory: No sensory deficit.     Motor: No abnormal muscle tone.     Coordination:  Coordination normal.  Psychiatric:        Mood and Affect: Mood normal.        Behavior: Behavior normal.        Thought Content: Thought content normal.        Judgment: Judgment normal.    ED Results / Procedures / Treatments   Labs (all labs ordered are listed, but only abnormal results are displayed) Labs Reviewed  BASIC METABOLIC PANEL - Abnormal; Notable for the following components:      Result Value   Creatinine, Ser 1.26 (*)    All other components within normal limits  CBC - Abnormal; Notable for the following components:   WBC 11.9 (*)    RBC 3.85 (*)    Hemoglobin 12.7 (*)    HCT 37.8 (*)    All other components within normal limits  RAPID URINE DRUG SCREEN, HOSP PERFORMED - Abnormal; Notable for the following components:   Tetrahydrocannabinol POSITIVE (*)    All other components within normal limits  URINALYSIS, ROUTINE W REFLEX MICROSCOPIC  LIPASE, BLOOD  CBG MONITORING, ED  TROPONIN I (HIGH SENSITIVITY)  TROPONIN I (HIGH SENSITIVITY)    EKG EKG Interpretation  Date/Time:  Thursday May 06 2021 16:26:26 EST Ventricular Rate:  53 PR Interval:  152 QRS Duration: 75 QT Interval:  417 QTC Calculation: 392 R Axis:   52 Text Interpretation: Sinus rhythm Probable left atrial enlargement ST elevation suggests acute pericarditis Since last tracing ST  abnormality is new Confirmed by Daleen Bo [phone]) on 05/06/2021 4:28:51 PM      Radiology DG Chest Portable 1 View  Result Date: 05/06/2021 CLINICAL DATA:  Syncopal episode today. EXAM: PORTABLE CHEST 1 VIEW COMPARISON:  01/26/2021 FINDINGS: Artifact overlies the chest. Heart and mediastinal shadows are normal. The lungs are clear. The vascularity is normal. No effusions. No abnormal bone finding. IMPRESSION: No active disease. Electronically Signed   By: Nelson Chimes M.D.   On: 05/06/2021 16:51    Procedures Procedures    Medications Ordered in ED Medications - No data to display  ED Course/  Medical Decision Making/ A&P Clinical Course as of 05/06/21 2111  Thu May 06, 2021  1648 Case  [EW]    Clinical Course User Index [EW] Daleen Bo, MD                           Medical Decision Making   Patient Vitals for the past 24 hrs:  BP Temp Temp src Pulse Resp SpO2 Height Weight  05/06/21 2045 118/79 -- -- Marland Kitchen  57 12 96 % -- --  05/06/21 2030 103/71 -- -- 64 13 94 % -- --  05/06/21 2015 103/71 -- -- 65 13 94 % -- --  05/06/21 1945 103/68 -- -- 64 13 93 % -- --  05/06/21 1930 106/72 -- -- 63 13 94 % -- --  05/06/21 1915 106/72 -- -- -- 12 -- -- --  05/06/21 1900 112/80 -- -- 76 16 93 % -- --  05/06/21 1830 126/83 -- -- 61 17 97 % -- --  05/06/21 1815 124/81 -- -- 62 14 98 % -- --  05/06/21 1745 136/77 -- -- -- -- -- -- --  05/06/21 1735 -- -- -- 61 17 98 % -- --  05/06/21 1730 (!) 133/59 -- -- 64 (!) 25 97 % -- --  05/06/21 1725 118/83 -- -- 63 15 97 % -- --  05/06/21 1656 115/77 97.9 F (36.6 C) Oral (!) 52 15 98 % -- --  05/06/21 1645 105/70 -- -- 62 16 97 % -- --  05/06/21 1612 105/66 97.8 F (36.6 C) Oral 61 17 98 % -- --  05/06/21 1609 -- -- -- -- -- -- 5\' 8"  (1.727 m) 68 kg    9:11 PM Reevaluation with update and discussion with patient. After initial assessment and treatment, an updated evaluation reveals alert and comfortable with normal vital signs.  He has tolerating oral nutrition. Illness risk, recurrent or progressive illness, discussed. Daleen Bo    Medical Decision Making: Summary of Illness/Injury: He is presenting for evaluation of vomiting, dizziness and hypotension.  Onset today after drinking a beer.  He is not having chest pain.  Critical Interventions-clinical evaluation, discussion of initial EKG with cardiology, laboratory testing, radiography, observation and reassessment.; to evaluate  Chief Complaint  Patient presents with   Loss of Consciousness    and assess for illness characterized as Acute, Previously Undiagnosed, Uncertain  Prognosis, Complicated, Systemic Symptoms, and Threat to Life/Bodily Function   The Differential Diagnoses include ACS, PE, pneumonia, gastrointestinal disorder, metabolic disorder, CNS disorder.  I did not require  Additional Historical Information from Anyone, as the patient is a good historian.    Clinical Laboratory Tests Ordered, included CBC, Metabolic panel, Urinalysis, and urine drug screen, lipase, troponin, repeat troponin . Review indicates normal except urine drug screen with THC, white count high, creatinine slightly elevated. Emergent testing abnormality management required for stabilization-no  Radiologic Tests Ordered, included chest x-ray.  I independently Visualized: Radiographic images, which show no infiltrate or edema  Cardiac Monitor Tracing which shows normal sinus rhythm, indicating stable cardiac rhythm    This patient is Presenting for Evaluation of near syncope, which does require a range of treatment options, and is a complaint that involves a high risk of morbidity and mortality.    Treatment Complication Risk Evaluation indicates discharge from ED is appropriate.  Hospitalization considered but presentation did not meet criteria.  After These Interventions, the Patient was reevaluated and was found to be candidate for discharge home with symptomatic treatment.  He presented with near syncope versus syncope, and a sensation of dizziness.  Initial EKG was abnormal, with worsening ST depression as compared to prior.  EKG could be interpreted as pericarditis however he does not have clinical findings for pericarditis.  On further laboratory evaluation, troponin was negative so doubt ACS.  Findings of EKG abnormality were discussed in real-time with the on-call cardiologist for interventions.  Dr. Tamala Julian does not think this meets criteria for STEMI and  that the patient can be evaluated in the ED and treated expectantly.  The patient did not show decompensation,  additional syncope or significant ongoing dizziness.    CRITICAL CARE-no Performed by: Daleen Bo              Final Clinical Impression(s) / ED Diagnoses Final diagnoses:  Near syncope    Rx / DC Orders ED Discharge Orders     None         Daleen Bo, MD 05/07/21 1335

## 2021-11-14 ENCOUNTER — Encounter (HOSPITAL_COMMUNITY): Payer: Self-pay

## 2021-11-14 ENCOUNTER — Emergency Department (HOSPITAL_COMMUNITY): Payer: 59

## 2021-11-14 ENCOUNTER — Other Ambulatory Visit: Payer: Self-pay

## 2021-11-14 ENCOUNTER — Emergency Department (HOSPITAL_COMMUNITY)
Admission: EM | Admit: 2021-11-14 | Discharge: 2021-11-14 | Disposition: A | Discharge status: Home / Self Care | Payer: 59 | Attending: Emergency Medicine | Admitting: Emergency Medicine

## 2021-11-14 DIAGNOSIS — R109 Unspecified abdominal pain: Secondary | ICD-10-CM | POA: Insufficient documentation

## 2021-11-14 DIAGNOSIS — R42 Dizziness and giddiness: Secondary | ICD-10-CM | POA: Insufficient documentation

## 2021-11-14 DIAGNOSIS — R55 Syncope and collapse: Secondary | ICD-10-CM | POA: Insufficient documentation

## 2021-11-14 DIAGNOSIS — K8020 Calculus of gallbladder without cholecystitis without obstruction: Secondary | ICD-10-CM

## 2021-11-14 DIAGNOSIS — M791 Myalgia, unspecified site: Secondary | ICD-10-CM | POA: Insufficient documentation

## 2021-11-14 DIAGNOSIS — J45909 Unspecified asthma, uncomplicated: Secondary | ICD-10-CM | POA: Insufficient documentation

## 2021-11-14 LAB — COMPREHENSIVE METABOLIC PANEL
ALT: 26 U/L (ref 0–44)
AST: 30 U/L (ref 15–41)
Albumin: 3.4 g/dL — ABNORMAL LOW (ref 3.5–5.0)
Alkaline Phosphatase: 58 U/L (ref 38–126)
Anion gap: 5 (ref 5–15)
BUN: 9 mg/dL (ref 6–20)
CO2: 27 mmol/L (ref 22–32)
Calcium: 9.1 mg/dL (ref 8.9–10.3)
Chloride: 106 mmol/L (ref 98–111)
Creatinine, Ser: 0.92 mg/dL (ref 0.61–1.24)
GFR, Estimated: >60 mL/min (ref >60)
Glucose, Bld: 94 mg/dL (ref 70–99)
Potassium: 3.9 mmol/L (ref 3.5–5.1)
Sodium: 138 mmol/L (ref 135–145)
Total Bilirubin: 0.9 mg/dL (ref 0.3–1.2)
Total Protein: 6.4 g/dL — ABNORMAL LOW (ref 6.5–8.1)

## 2021-11-14 LAB — CBC WITH DIFFERENTIAL/PLATELET
Abs Immature Granulocytes: 0.02 10*3/uL (ref 0.00–0.07)
Basophils Absolute: 0.1 10*3/uL (ref 0.0–0.1)
Basophils Relative: 1 %
Eosinophils Absolute: 0.3 10*3/uL (ref 0.0–0.5)
Eosinophils Relative: 3 %
HCT: 42.1 % (ref 39.0–52.0)
Hemoglobin: 14.6 g/dL (ref 13.0–17.0)
Immature Granulocytes: 0 %
Lymphocytes Relative: 27 %
Lymphs Abs: 2.4 10*3/uL (ref 0.7–4.0)
MCH: 33.3 pg (ref 26.0–34.0)
MCHC: 34.7 g/dL (ref 30.0–36.0)
MCV: 96.1 fL (ref 80.0–100.0)
Monocytes Absolute: 0.9 10*3/uL (ref 0.1–1.0)
Monocytes Relative: 11 %
Neutro Abs: 4.9 10*3/uL (ref 1.7–7.7)
Neutrophils Relative %: 58 %
Platelets: 280 10*3/uL (ref 150–400)
RBC: 4.38 MIL/uL (ref 4.22–5.81)
RDW: 13.2 % (ref 11.5–15.5)
WBC: 8.6 10*3/uL (ref 4.0–10.5)
nRBC: 0 % (ref 0.0–0.2)

## 2021-11-14 LAB — D-DIMER, QUANTITATIVE: D-Dimer, Quant: <0.27 ug/mL-FEU (ref 0.00–0.50)

## 2021-11-14 LAB — TROPONIN I (HIGH SENSITIVITY)
Troponin I (High Sensitivity): 11 ng/L (ref <18)
Troponin I (High Sensitivity): 7 ng/L (ref <18)

## 2021-11-14 LAB — MAGNESIUM: Magnesium: 1.8 mg/dL (ref 1.7–2.4)

## 2021-11-14 LAB — CK: Total CK: 546 U/L — ABNORMAL HIGH (ref 49–397)

## 2021-11-14 MED ORDER — IOHEXOL 300 MG/ML  SOLN
80.0000 mL | Freq: Once | INTRAMUSCULAR | Status: AC | PRN
  Administered 2021-11-14: 80 mL via INTRAVENOUS

## 2021-11-14 MED ORDER — LACTATED RINGERS IV BOLUS
1000.0000 mL | Freq: Once | INTRAVENOUS | Status: AC
Start: 2021-11-14 — End: 2021-11-14
  Administered 2021-11-14: 1000 mL via INTRAVENOUS

## 2021-11-14 MED ORDER — FAMOTIDINE IN NACL 20-0.9 MG/50ML-% IV SOLN
20.0000 mg | Freq: Once | INTRAVENOUS | Status: AC
  Administered 2021-11-14: 20 mg via INTRAVENOUS
  Filled 2021-11-14: qty 50

## 2021-11-14 MED ORDER — ACETAMINOPHEN 325 MG PO TABS
650.0000 mg | ORAL_TABLET | Freq: Once | ORAL | Status: AC
  Administered 2021-11-14: 650 mg via ORAL
  Filled 2021-11-14: qty 2

## 2021-11-14 MED ORDER — DIPHENHYDRAMINE HCL 50 MG/ML IJ SOLN
50.0000 mg | Freq: Once | INTRAMUSCULAR | Status: AC
  Administered 2021-11-14: 50 mg via INTRAVENOUS
  Filled 2021-11-14: qty 1

## 2021-11-14 MED ORDER — METOCLOPRAMIDE HCL 5 MG/ML IJ SOLN
10.0000 mg | Freq: Once | INTRAMUSCULAR | Status: AC
  Administered 2021-11-14: 10 mg via INTRAVENOUS
  Filled 2021-11-14: qty 2

## 2021-11-14 NOTE — Discharge Instructions (Signed)
You are likely dehydrated.  Please stay hydrated.  You also have some gallstones.  Take Tylenol or Motrin for pain  See general surgery for follow-up  Return to ER if you pass out, abdominal pain, headache

## 2021-11-14 NOTE — ED Triage Notes (Signed)
Pt BIB [facility] from the street where he had a syncopal episode. Per EMS. Pt was walking back from church, started feeling dizzy, then passed out, hit the head on the grass. Pt is ambulatory on scene. Pt has been having flu for 2 weeks. A&O X4. VSS.   128/72 95% RA 135 cbg  15 RR

## 2021-11-14 NOTE — ED Notes (Signed)
Patient verbalizes understanding of discharge instructions. Opportunity for questioning and answers were provided. Armband removed by staff, pt discharged from ED.  

## 2021-11-14 NOTE — ED Provider Notes (Signed)
  Physical Exam  BP (!) 137/101   Pulse 90   Temp 98.5 F (36.9 C) (Oral)   Resp (!) 21   SpO2 98%   Physical Exam  Procedures  Procedures  ED Course / MDM    Medical Decision Making Care assumed at 3 PM.  Patient is here with syncope.  Signed out pending labs  5 pm  Patient's labs are unremarkable.  Patient is complaining of headache and states that he did hit his head.  Also has some epigastric pain.  We will get CT head and CT abdomen pelvis  7:34 PM CT were unremarkable.  Patient does have some tiny gallstones but there is no inflammatory changes.  LFTs are normal.  No pain after Pepcid now.  Stable for discharge.  Problems Addressed: Gallstones: acute illness or injury Myalgia: acute illness or injury Syncope and collapse: acute illness or injury  Amount and/or Complexity of Data Reviewed Labs: ordered. Radiology: ordered. ECG/medicine tests: ordered.  Risk OTC drugs. Prescription drug management.          Charlynne Pander, MD 11/14/21 [phone]

## 2021-11-14 NOTE — ED Provider Notes (Signed)
[facility] EMERGENCY DEPARTMENT Provider Note   CSN: [id number] Arrival date & time: 11/14/21  1329     History  Chief Complaint  Patient presents with   Loss of Consciousness    Frank [name] is a 51 y.o. male.   Loss of Consciousness Associated symptoms: dizziness   Patient presents for syncope.  Medical history includes arthritis, asthma, gout, polysubstance abuse, alcohol abuse.  Recent history is as follows: Patient moved into a new house 1 week ago.  At that time, he was straining himself physically moving heavy bags.  He had subsequent soreness in mid and lower back, legs, and chest.  This soreness has been persistent, but has improved since a week ago.  In the place where he is staying, they have been putting boric acid in the carpet for bugs and were cleaning the floor with ammonia.  He feels that these chemical exposures have irritated his lungs.  He has had persistent coughing over the past 3 days.  He also endorses hoarseness to his voice from the coughing.  He has not drink alcohol in the past 3 days.  He has been drinking water to stay hydrated.  Prior to arrival, he was walking when he experienced lightheadedness and a subsequent syncopal episode.  He fell onto the grass.  He does not believe that he sustained any injuries from the fall.  He has been ambulatory.  He continues to endorse diffuse body aches as well as some dizziness.  He denies any acute chest pain during this episode.     Home Medications Prior to Admission medications   Medication Sig Start Date End Date Taking? Authorizing Provider  gabapentin (NEURONTIN) 800 MG tablet Take 2,400 mg by mouth at bedtime.   Yes [provider]  ipratropium (ATROVENT HFA) 17 MCG/ACT inhaler Inhale 2 puffs into the lungs every 6 (six) hours as needed for wheezing.   Yes [provider]  mometasone-formoterol (DULERA) 200-5 MCG/ACT AERO Inhale 2 puffs into the lungs 2 (two) times daily.    Yes [provider]  albuterol (VENTOLIN HFA) 108 (90 Base) MCG/ACT inhaler Inhale 2 puffs into the lungs every 4 (four) hours as needed for wheezing or shortness of breath. Patient not taking: Reported on 11/14/2021 02/06/21   Dorcas Carrow, MD  amLODipine (NORVASC) 5 MG tablet Take 1 tablet (5 mg total) by mouth daily. 02/06/21 03/08/21  Dorcas Carrow, MD  diclofenac Sodium (VOLTAREN) 1 % GEL Apply 2 g topically 4 (four) times daily. Patient not taking: Reported on 11/14/2021 04/14/21   Nira Conn, MD  gabapentin (NEURONTIN) 300 MG capsule Take 1 capsule (300 mg total) by mouth 3 (three) times daily. 04/14/21 05/14/21  Nira Conn, MD  methocarbamol (ROBAXIN) 500 MG tablet Take 1 tablet (500 mg total) by mouth 2 (two) times daily. Patient not taking: Reported on 11/14/2021 04/14/21   Nira Conn, MD  nicotine (NICODERM CQ - DOSED IN MG/24 HOURS) 21 mg/24hr patch Place 1 patch (21 mg total) onto the skin as needed (nicotine withdrawal). Patient not taking: Reported on 02/19/2021 02/06/21   Dorcas Carrow, MD  oxyCODONE (OXY IR/ROXICODONE) 5 MG immediate release tablet Take 1 tablet (5 mg total) by mouth every 6 (six) hours as needed for moderate pain or breakthrough pain. Patient not taking: Reported on 11/14/2021 02/06/21   Dorcas Carrow, MD  oxyCODONE-acetaminophen (PERCOCET/ROXICET) 5-325 MG tablet Take 1 tablet by mouth every 6 (six) hours as needed for severe pain. Patient  not taking: Reported on 11/14/2021 02/19/21   Tegeler, Canary Brim, MD      Allergies    Banana and Other    Review of Systems   Review of Systems  Respiratory:  Positive for cough.   Cardiovascular:  Positive for syncope.  Musculoskeletal:  Positive for myalgias.  Neurological:  Positive for dizziness, syncope and light-headedness.  All other systems reviewed and are negative.   Physical Exam Updated Vital Signs BP (!) 138/93   Pulse 73   Temp 98.5 F (36.9 C)  (Oral)   Resp 20   SpO2 97%  Physical Exam Vitals and nursing note reviewed.  Constitutional:      General: He is not in acute distress.    Appearance: Normal appearance. He is well-developed and normal weight. He is not ill-appearing, toxic-appearing or diaphoretic.  HENT:     Head: Normocephalic and atraumatic.     Right Ear: External ear normal.     Left Ear: External ear normal.     Nose: Nose normal.     Mouth/Throat:     Mouth: Mucous membranes are moist.     Pharynx: Oropharynx is clear.  Eyes:     Extraocular Movements: Extraocular movements intact.     Conjunctiva/sclera: Conjunctivae normal.  Cardiovascular:     Rate and Rhythm: Normal rate and regular rhythm.     Heart sounds: No murmur heard. Pulmonary:     Effort: Pulmonary effort is normal. No respiratory distress.     Breath sounds: Normal breath sounds. No wheezing or rales.  Chest:     Chest wall: No tenderness.  Abdominal:     Palpations: Abdomen is soft.     Tenderness: There is no abdominal tenderness.  Musculoskeletal:        General: Tenderness (Mild tenderness in musculature of mid and lower back) present. No swelling or deformity. Normal range of motion.     Cervical back: Normal range of motion and neck supple.     Right lower leg: No edema.     Left lower leg: No edema.  Skin:    General: Skin is warm and dry.     Coloration: Skin is not jaundiced or pale.  Neurological:     General: No focal deficit present.     Mental Status: He is alert and oriented to person, place, and time.     Cranial Nerves: No cranial nerve deficit.     Sensory: No sensory deficit.     Motor: No weakness.     Coordination: Coordination normal.  Psychiatric:        Mood and Affect: Mood normal.        Behavior: Behavior normal.        Thought Content: Thought content normal.        Judgment: Judgment normal.     ED Results / Procedures / Treatments   Labs (all labs ordered are listed, but only abnormal results  are displayed) Labs Reviewed  CBC WITH DIFFERENTIAL/PLATELET  COMPREHENSIVE METABOLIC PANEL  MAGNESIUM  CK  D-DIMER, QUANTITATIVE  TROPONIN I (HIGH SENSITIVITY)  TROPONIN I (HIGH SENSITIVITY)    EKG None  Radiology DG Chest Port 1 View  Result Date: 11/14/2021 CLINICAL DATA:  Syncope and recent cough. EXAM: PORTABLE CHEST 1 VIEW COMPARISON:  05/06/2021 FINDINGS: Lungs are somewhat hyperexpanded without focal airspace consolidation or effusion. Cardiomediastinal silhouette and remainder of the exam is unchanged. IMPRESSION: No acute cardiopulmonary disease. Electronically Signed   By: Elberta Fortis  M.D.   On: 11/14/2021 14:12    Procedures Procedures    Medications Ordered in ED Medications  lactated ringers bolus 1,000 mL (1,000 mLs Intravenous New Bag/Given 11/14/21 1456)    ED Course/ Medical Decision Making/ A&P                           Medical Decision Making Amount and/or Complexity of Data Reviewed Labs: ordered. Radiology: ordered. ECG/medicine tests: ordered.   This patient presents to the ED for concern of syncope, this involves an extensive number of treatment options, and is a complaint that carries with it a high risk of complications and morbidity.  The differential diagnosis includes dehydration, arrhythmia, vasovagal episode, polypharmacy, intoxication, hypoglycemia, metabolic derangements   Co morbidities that complicate the patient evaluation  arthritis, asthma, gout, polysubstance abuse, alcohol abuse   Additional history obtained:  Additional history obtained from N/A External records from outside source obtained and reviewed including EMR   Lab Tests:  I Ordered, and personally interpreted labs.  The pertinent results include: (Pending at time of signout)   Imaging Studies ordered:  I ordered imaging studies including chest x-ray I independently visualized and interpreted imaging which showed no acute findings I agree with the  radiologist interpretation   Cardiac Monitoring: / EKG:  The patient was maintained on a cardiac monitor.  I personally viewed and interpreted the cardiac monitored which showed an underlying rhythm of: Sinus rhythm   Problem List / ED Course / Critical interventions / Medication management  Patient is a 51 year old male who presents after syncopal episode.  He describes a prodrome of lightheadedness.  He syncopized and fell onto grass.  He denies any suspected sutures from his fall.  EMS noted normal vital signs on scene.  Patient arrives in the ED with continued normal vital signs.  He is well-appearing on exam and has no focal neurologic deficits.  No cardiac murmurs are appreciated on auscultation.  He has no evidence of acute injuries from his fall.  Laboratory work-up was initiated to assess for electrolyte abnormalities or acute anemia.  He does endorse that he has had muscle soreness for the past week secondary to exerting himself moving heavy bags.  CK was ordered in addition to cardiac enzymes.  Bolus of IV fluids was ordered.  Patient was kept on cardiac monitor.  At time of signout, laboratory results were pending.  Care of patient was signed out to oncoming ED provider. I ordered medication including IV fluids for hydration Reevaluation of the patient after these medicines showed that the patient stayed the same I have reviewed the patients home medicines and have made adjustments as needed   Social Determinants of Health:  Does not have PCP         Final Clinical Impression(s) / ED Diagnoses Final diagnoses:  Syncope and collapse  Myalgia    Rx / DC Orders ED Discharge Orders     None         Godfrey Pick, MD 11/14/21 1625

## 2021-12-09 ENCOUNTER — Emergency Department (HOSPITAL_COMMUNITY)
Admission: EM | Admit: 2021-12-09 | Discharge: 2021-12-09 | Disposition: A | Discharge status: Home / Self Care | Payer: Self-pay | Attending: Emergency Medicine | Admitting: Emergency Medicine

## 2021-12-09 ENCOUNTER — Emergency Department (HOSPITAL_COMMUNITY): Payer: Medicaid Other

## 2021-12-09 ENCOUNTER — Other Ambulatory Visit: Payer: Self-pay

## 2021-12-09 DIAGNOSIS — J45909 Unspecified asthma, uncomplicated: Secondary | ICD-10-CM | POA: Insufficient documentation

## 2021-12-09 DIAGNOSIS — R059 Cough, unspecified: Secondary | ICD-10-CM | POA: Insufficient documentation

## 2021-12-09 DIAGNOSIS — F17290 Nicotine dependence, other tobacco product, uncomplicated: Secondary | ICD-10-CM | POA: Insufficient documentation

## 2021-12-09 DIAGNOSIS — R0602 Shortness of breath: Secondary | ICD-10-CM | POA: Insufficient documentation

## 2021-12-09 DIAGNOSIS — Z7951 Long term (current) use of inhaled steroids: Secondary | ICD-10-CM | POA: Insufficient documentation

## 2021-12-09 DIAGNOSIS — R079 Chest pain, unspecified: Secondary | ICD-10-CM | POA: Insufficient documentation

## 2021-12-09 DIAGNOSIS — K122 Cellulitis and abscess of mouth: Secondary | ICD-10-CM | POA: Insufficient documentation

## 2021-12-09 LAB — CBC WITH DIFFERENTIAL/PLATELET
Abs Immature Granulocytes: 0.06 10*3/uL (ref 0.00–0.07)
Basophils Absolute: 0.1 10*3/uL (ref 0.0–0.1)
Basophils Relative: 1 %
Eosinophils Absolute: 0.4 10*3/uL (ref 0.0–0.5)
Eosinophils Relative: 2 %
HCT: 39.4 % (ref 39.0–52.0)
Hemoglobin: 13.7 g/dL (ref 13.0–17.0)
Immature Granulocytes: 0 %
Lymphocytes Relative: 28 %
Lymphs Abs: 4.1 10*3/uL — ABNORMAL HIGH (ref 0.7–4.0)
MCH: 33.8 pg (ref 26.0–34.0)
MCHC: 34.8 g/dL (ref 30.0–36.0)
MCV: 97.3 fL (ref 80.0–100.0)
Monocytes Absolute: 1.3 10*3/uL — ABNORMAL HIGH (ref 0.1–1.0)
Monocytes Relative: 9 %
Neutro Abs: 8.8 10*3/uL — ABNORMAL HIGH (ref 1.7–7.7)
Neutrophils Relative %: 60 %
Platelets: 353 10*3/uL (ref 150–400)
RBC: 4.05 MIL/uL — ABNORMAL LOW (ref 4.22–5.81)
RDW: 13.2 % (ref 11.5–15.5)
WBC: 14.7 10*3/uL — ABNORMAL HIGH (ref 4.0–10.5)
nRBC: 0 % (ref 0.0–0.2)

## 2021-12-09 LAB — BASIC METABOLIC PANEL
Anion gap: 5 (ref 5–15)
BUN: 13 mg/dL (ref 6–20)
CO2: 29 mmol/L (ref 22–32)
Calcium: 9.2 mg/dL (ref 8.9–10.3)
Chloride: 108 mmol/L (ref 98–111)
Creatinine, Ser: 0.99 mg/dL (ref 0.61–1.24)
GFR, Estimated: >60 mL/min (ref >60)
Glucose, Bld: 92 mg/dL (ref 70–99)
Potassium: 3.5 mmol/L (ref 3.5–5.1)
Sodium: 142 mmol/L (ref 135–145)

## 2021-12-09 LAB — TROPONIN I (HIGH SENSITIVITY): Troponin I (High Sensitivity): 4 ng/L (ref <18)

## 2021-12-09 MED ORDER — ALBUTEROL SULFATE (2.5 MG/3ML) 0.083% IN NEBU: 2.5000 mg | INHALATION_SOLUTION | Freq: Four times a day (QID) | RESPIRATORY_TRACT | 12 refills | Status: DC | PRN

## 2021-12-09 MED ORDER — DULERA 200-5 MCG/ACT IN AERO: 2.0000 | INHALATION_SPRAY | Freq: Two times a day (BID) | RESPIRATORY_TRACT | 0 refills | Status: DC

## 2021-12-09 MED ORDER — ALBUTEROL SULFATE HFA 108 (90 BASE) MCG/ACT IN AERS
1.0000 | INHALATION_SPRAY | Freq: Once | RESPIRATORY_TRACT | Status: AC
  Administered 2021-12-09: 2 via RESPIRATORY_TRACT
  Filled 2021-12-09: qty 6.7

## 2021-12-09 MED ORDER — AMOXICILLIN-POT CLAVULANATE 875-125 MG PO TABS: 1.0000 | ORAL_TABLET | Freq: Two times a day (BID) | ORAL | 0 refills | Status: DC

## 2021-12-09 NOTE — Progress Notes (Addendum)
CS[facility] and schedule this pt an appt for 01/24/22 at [facility]. Pt was discharged this CSW added appt information to AVS. This CSW will attempt to contact pt by phone.   This CSW attempted to contact the pt to inform of his follow up PCP appt and was unable to reach him at any of the numbers listed.

## 2021-12-09 NOTE — ED Provider Notes (Signed)
[facility]-EMERGENCY DEPT Provider Note   CSN: [id number] Arrival date & time: 12/09/21  0551     History  Chief Complaint  Patient presents with   Shortness of Breath   Chest Pain    Frank [name] is a 51 y.o. male who presents to the emergency department complaining of ongoing chest pain and shortness of breath for "a while". Received two albuterol breathing treatments during EMS transport.   States he has been dealing with chest tightness, shortness of breath, and coughing for several years. Symptoms are worse when laying flat and first thing in the morning. Has been on several inhalers for chronic bronchitis. Endorses smoking cigarettes and cigars. Hx of asthma as a child. Has not seen a primary doctor in quite some time. Denies any fevers, chills, leg pain or swelling.   Also complaining of an abscess on the roof of his mouth he thinks is related to a cracked upper tooth.    Shortness of Breath Associated symptoms: chest pain, cough and wheezing   Associated symptoms: no fever   Chest Pain Associated symptoms: cough and shortness of breath   Associated symptoms: no fever and no palpitations        Home Medications Prior to Admission medications   Medication Sig Start Date End Date Taking? Authorizing Provider  albuterol (PROVENTIL) (2.5 MG/3ML) 0.083% nebulizer solution Take 3 mLs (2.5 mg total) by nebulization every 6 (six) hours as needed for wheezing or shortness of breath. 12/09/21  Yes Constantinos Krempasky T, PA-C  amoxicillin-clavulanate (AUGMENTIN) 875-125 MG tablet Take 1 tablet by mouth every 12 (twelve) hours. 12/09/21  Yes Markia Kyer T, PA-C  albuterol (VENTOLIN HFA) 108 (90 Base) MCG/ACT inhaler Inhale 2 puffs into the lungs every 4 (four) hours as needed for wheezing or shortness of breath. Patient not taking: Reported on 11/14/2021 02/06/21   Dorcas Carrow, MD  amLODipine (NORVASC) 5 MG tablet Take 1 tablet (5 mg total) by mouth daily.  02/06/21 03/08/21  Dorcas Carrow, MD  gabapentin (NEURONTIN) 300 MG capsule Take 1 capsule (300 mg total) by mouth 3 (three) times daily. 04/14/21 05/14/21  Nira Conn, MD  gabapentin (NEURONTIN) 800 MG tablet Take 2,400 mg by mouth at bedtime.    [provider]  ipratropium (ATROVENT HFA) 17 MCG/ACT inhaler Inhale 2 puffs into the lungs every 6 (six) hours as needed for wheezing.    [provider]  mometasone-formoterol (DULERA) 200-5 MCG/ACT AERO Inhale 2 puffs into the lungs 2 (two) times daily. 12/09/21   Lakrista Scaduto T, PA-C      Allergies    Banana and Other    Review of Systems   Review of Systems  Constitutional:  Negative for chills and fever.  HENT:  Positive for dental problem.   Respiratory:  Positive for cough, chest tightness, shortness of breath and wheezing.   Cardiovascular:  Positive for chest pain. Negative for palpitations and leg swelling.  Psychiatric/Behavioral:  Positive for sleep disturbance.   All other systems reviewed and are negative.   Physical Exam Updated Vital Signs BP (!) 145/90   Pulse 79   Temp 97.7 F (36.5 C) (Oral)   Resp 17   Ht 5\' 8"  (1.727 m)   Wt 62.6 kg   SpO2 95%   BMI 20.98 kg/m  Physical Exam Vitals and nursing note reviewed.  Constitutional:      Appearance: Normal appearance.  HENT:     Head: Normocephalic and atraumatic.  Mouth/Throat:     Dentition: Abnormal dentition. Dental abscesses present.     Comments: Poor dentition with several broken teeth and caries. Small abscess noted to the hard palate just interior to right upper incisor. No tonsillar abscess or sublingual swelling.  Eyes:     Conjunctiva/sclera: Conjunctivae normal.  Cardiovascular:     Rate and Rhythm: Normal rate and regular rhythm.  Pulmonary:     Effort: Pulmonary effort is normal. No respiratory distress.     Breath sounds: Normal breath sounds.  Abdominal:     General: There is no distension.     Palpations:  Abdomen is soft.     Tenderness: There is no abdominal tenderness.  Skin:    General: Skin is warm and dry.  Neurological:     General: No focal deficit present.     Mental Status: He is alert.     ED Results / Procedures / Treatments   Labs (all labs ordered are listed, but only abnormal results are displayed) Labs Reviewed  CBC WITH DIFFERENTIAL/PLATELET - Abnormal; Notable for the following components:      Result Value   WBC 14.7 (*)    RBC 4.05 (*)    Neutro Abs 8.8 (*)    Lymphs Abs 4.1 (*)    Monocytes Absolute 1.3 (*)    All other components within normal limits  BASIC METABOLIC PANEL  TROPONIN I (HIGH SENSITIVITY)  TROPONIN I (HIGH SENSITIVITY)    EKG None  Radiology DG Chest Portable 1 View  Result Date: 12/09/2021 CLINICAL DATA:  Shortness of breath. EXAM: PORTABLE CHEST 1 VIEW COMPARISON:  11/14/2021 FINDINGS: 0616 hours. The lungs are clear without focal pneumonia, edema, pneumothorax or pleural effusion. The cardiopericardial silhouette is within normal limits for size. Telemetry leads overlie the chest. The visualized bony structures of the thorax are unremarkable. IMPRESSION: No active disease. Electronically Signed   By: Kennith Center M.D.   On: 12/09/2021 06:56    Procedures Procedures    Medications Ordered in ED Medications  albuterol (VENTOLIN HFA) 108 (90 Base) MCG/ACT inhaler 1-2 puff (2 puffs Inhalation Given 12/09/21 0748)    ED Course/ Medical Decision Making/ A&P                           Medical Decision Making  Patient is a 51 year old male with history of asthma, gout, and arthritis who presents the emergency department complaining of chronic chest tightness, shortness of breath, and cough.  Also complaining of a dental abscess.  On exam patient is hypertensive to 145/90, otherwise normal vital signs.  Received 2 albuterol breathing treatments by EMS prior to arrival in the ER.  No longer has any wheezing, and has normal oxygen  saturation on room air.  Has an abscess noted to the hard palate just anterior to the right upper incisor, with overall poor dentition.  No tonsillar abscess or sublingual swelling suggesting PTA or Ludwig's angina.  Leukocytosis of 14.7, hemoglobin normal.  BMP normal.  Troponin of 4.  Dust x-ray without acute cardiopulmonary abnormalities.  EKG with sinus rhythm.  After consideration of the diagnostic results and the patients response to treatment, I feel that emergency department workup does not suggest an emergent condition requiring admission or immediate intervention beyond what has been performed at this time.  Suspect the patient's symptoms are related to a chronic issue likely stemming from needing his medications refilled.  Does not meet SIRS, and cardiac workup  reassuring. Have given him an inhaler, nebulizer, and antibiotics for his palate abscess.  Given resources for dentists, and consulted transition of care for PCP needs.  The patient is safe for discharge and has been instructed to return immediately for worsening symptoms, change in symptoms or any other concerns.        Final Clinical Impression(s) / ED Diagnoses Final diagnoses:  Shortness of breath  Abscess of palate    Rx / DC Orders ED Discharge Orders          Ordered    albuterol (PROVENTIL) (2.5 MG/3ML) 0.083% nebulizer solution  Every 6 hours PRN        12/09/21 0749    amoxicillin-clavulanate (AUGMENTIN) 875-125 MG tablet  Every 12 hours        12/09/21 0749    mometasone-formoterol (DULERA) 200-5 MCG/ACT AERO  2 times daily        12/09/21 0749           Portions of this report may have been transcribed using voice recognition software. Every effort was made to ensure accuracy; however, inadvertent computerized transcription errors may be present.    Jeanella Flattery 12/09/21 0801    Wynetta Fines, MD 12/10/21 [phone]

## 2021-12-09 NOTE — ED Triage Notes (Signed)
BIBA from home for [facility] and CP that's been going on for awhile got worse today,   2 albuterol txt prior to hospital 18 LAC

## 2021-12-09 NOTE — Discharge Instructions (Addendum)
You were seen in the emergency department for shortness of breath and a dental abscess.  Your lab work looked very reassuring. I think your difficulty breathing is likely chronic inflammation in your lungs, both from having asthma and smoking. We have given you an inhaler and I have sent prescriptions for a nebulizer and antibiotics for your dental abscess to your pharmacy.  I've attached some resources for dentists in the area as well. I looked back at your paperwork from your last visit and attached the contact info for [facility] for you to follow up about your gallstones. I've contacted our Transitions of Care team to reach out to you and help you find a primary doctor.   Continue to monitor how you're doing and return to the ER for new or worsening symptoms.

## 2022-01-24 ENCOUNTER — Ambulatory Visit: Payer: Medicaid Other | Admitting: Nurse Practitioner

## 2022-07-16 ENCOUNTER — Encounter (HOSPITAL_COMMUNITY): Payer: Self-pay

## 2022-07-16 ENCOUNTER — Other Ambulatory Visit: Payer: Self-pay

## 2022-07-16 ENCOUNTER — Emergency Department (HOSPITAL_COMMUNITY): Payer: Medicaid Other

## 2022-07-16 ENCOUNTER — Emergency Department (HOSPITAL_COMMUNITY)
Admission: EM | Admit: 2022-07-16 | Discharge: 2022-07-16 | Disposition: A | Discharge status: Home / Self Care | Payer: Medicaid Other | Attending: Emergency Medicine | Admitting: Emergency Medicine

## 2022-07-16 DIAGNOSIS — F17210 Nicotine dependence, cigarettes, uncomplicated: Secondary | ICD-10-CM | POA: Insufficient documentation

## 2022-07-16 DIAGNOSIS — G4489 Other headache syndrome: Secondary | ICD-10-CM | POA: Diagnosis not present

## 2022-07-16 DIAGNOSIS — I10 Essential (primary) hypertension: Secondary | ICD-10-CM | POA: Diagnosis not present

## 2022-07-16 DIAGNOSIS — R062 Wheezing: Secondary | ICD-10-CM | POA: Diagnosis not present

## 2022-07-16 DIAGNOSIS — Z743 Need for continuous supervision: Secondary | ICD-10-CM | POA: Diagnosis not present

## 2022-07-16 DIAGNOSIS — Z7952 Long term (current) use of systemic steroids: Secondary | ICD-10-CM | POA: Diagnosis not present

## 2022-07-16 DIAGNOSIS — J4521 Mild intermittent asthma with (acute) exacerbation: Secondary | ICD-10-CM

## 2022-07-16 DIAGNOSIS — R0602 Shortness of breath: Secondary | ICD-10-CM | POA: Diagnosis not present

## 2022-07-16 DIAGNOSIS — Z7951 Long term (current) use of inhaled steroids: Secondary | ICD-10-CM | POA: Diagnosis not present

## 2022-07-16 DIAGNOSIS — R519 Headache, unspecified: Secondary | ICD-10-CM | POA: Diagnosis not present

## 2022-07-16 LAB — CBC WITH DIFFERENTIAL/PLATELET
Abs Immature Granulocytes: 0.01 10*3/uL (ref 0.00–0.07)
Basophils Absolute: 0.1 10*3/uL (ref 0.0–0.1)
Basophils Relative: 1 %
Eosinophils Absolute: 0.4 10*3/uL (ref 0.0–0.5)
Eosinophils Relative: 6 %
HCT: 43 % (ref 39.0–52.0)
Hemoglobin: 14.5 g/dL (ref 13.0–17.0)
Immature Granulocytes: 0 %
Lymphocytes Relative: 40 %
Lymphs Abs: 2.8 10*3/uL (ref 0.7–4.0)
MCH: 33 pg (ref 26.0–34.0)
MCHC: 33.7 g/dL (ref 30.0–36.0)
MCV: 97.9 fL (ref 80.0–100.0)
Monocytes Absolute: 0.9 10*3/uL (ref 0.1–1.0)
Monocytes Relative: 12 %
Neutro Abs: 3 10*3/uL (ref 1.7–7.7)
Neutrophils Relative %: 41 %
Platelets: 310 10*3/uL (ref 150–400)
RBC: 4.39 MIL/uL (ref 4.22–5.81)
RDW: 13.7 % (ref 11.5–15.5)
WBC: 7.1 10*3/uL (ref 4.0–10.5)
nRBC: 0 % (ref 0.0–0.2)

## 2022-07-16 LAB — COMPREHENSIVE METABOLIC PANEL
ALT: 25 U/L (ref 0–44)
AST: 27 U/L (ref 15–41)
Albumin: 3.6 g/dL (ref 3.5–5.0)
Alkaline Phosphatase: 50 U/L (ref 38–126)
Anion gap: 5 (ref 5–15)
BUN: 11 mg/dL (ref 6–20)
CO2: 26 mmol/L (ref 22–32)
Calcium: 8.8 mg/dL — ABNORMAL LOW (ref 8.9–10.3)
Chloride: 104 mmol/L (ref 98–111)
Creatinine, Ser: 1.13 mg/dL (ref 0.61–1.24)
GFR, Estimated: >60 mL/min (ref >60)
Glucose, Bld: 120 mg/dL — ABNORMAL HIGH (ref 70–99)
Potassium: 3.4 mmol/L — ABNORMAL LOW (ref 3.5–5.1)
Sodium: 135 mmol/L (ref 135–145)
Total Bilirubin: 0.7 mg/dL (ref 0.3–1.2)
Total Protein: 6.4 g/dL — ABNORMAL LOW (ref 6.5–8.1)

## 2022-07-16 MED ORDER — LACTATED RINGERS IV BOLUS
1000.0000 mL | Freq: Once | INTRAVENOUS | Status: AC
  Administered 2022-07-16: 1000 mL via INTRAVENOUS

## 2022-07-16 MED ORDER — KETOROLAC TROMETHAMINE 15 MG/ML IJ SOLN
15.0000 mg | Freq: Once | INTRAMUSCULAR | Status: AC
  Administered 2022-07-16: 15 mg via INTRAVENOUS
  Filled 2022-07-16: qty 1

## 2022-07-16 MED ORDER — ALBUTEROL SULFATE HFA 108 (90 BASE) MCG/ACT IN AERS
2.0000 | INHALATION_SPRAY | Freq: Four times a day (QID) | RESPIRATORY_TRACT | 2 refills | Status: DC | PRN
  Filled 2022-07-16: qty 18, 25d supply, fill #0

## 2022-07-16 MED ORDER — PREDNISONE 50 MG PO TABS
50.0000 mg | ORAL_TABLET | Freq: Every day | ORAL | 0 refills | Status: AC
  Filled 2022-07-16: qty 5, 5d supply, fill #0

## 2022-07-16 MED ORDER — IPRATROPIUM-ALBUTEROL 0.5-2.5 (3) MG/3ML IN SOLN
3.0000 mL | Freq: Once | RESPIRATORY_TRACT | Status: AC
Start: 2022-07-16 — End: 2022-07-16
  Administered 2022-07-16: 3 mL via RESPIRATORY_TRACT
  Filled 2022-07-16: qty 3

## 2022-07-16 MED ORDER — ACETAMINOPHEN 500 MG PO TABS
1000.0000 mg | ORAL_TABLET | Freq: Once | ORAL | Status: AC
  Administered 2022-07-16: 1000 mg via ORAL
  Filled 2022-07-16: qty 2

## 2022-07-16 MED ORDER — ALBUTEROL SULFATE HFA 108 (90 BASE) MCG/ACT IN AERS
1.0000 | INHALATION_SPRAY | Freq: Four times a day (QID) | RESPIRATORY_TRACT | Status: DC | PRN
  Administered 2022-07-16: 1 via RESPIRATORY_TRACT
  Filled 2022-07-16: qty 6.7

## 2022-07-16 NOTE — Discharge Instructions (Addendum)
We evaluated you for for your shortness of breath.  Your symptoms improved with breathing treatment and steroids.  I have prescribed you a 5-day course of steroids please start them tomorrow.  I have also refilled your inhaler.  Please call your primary care doctor for a follow-up appointment.  If you develop any worsening symptoms, cough up phlegm, fevers or chills, chest pain, fainting, lightheadedness, severe headaches, new numbness or tingling, facial droop, trouble swallowing, or any other new symptoms, please return to the emergency department for reassessment.

## 2022-07-16 NOTE — ED Provider Notes (Signed)
[facility] Provider Note  CSN: [id number] Arrival date & time: 07/16/22 [id number]  Chief Complaint(s) Shortness of Breath  HPI Frank [name] is a 52 y.o. male history of asthma, prior episode of rhabdomyolysis and renal failure since resolved, presenting to the emergency department with shortness of breath.  Reports that it feels like his typical asthma attack.  He reports that he developed symptoms today.  He reports associated wheezing.  He reports a dry cough, no chest pain.  No lightheadedness or dizziness.  No fevers or chills.  He also reports a headache, reports this is typical of his migraine headache, no numbness or tingling, weakness, facial droop, trouble speaking, trouble swallowing, vision changes.  He received Atrovent, Solu-Medrol, albuterol with EMS.  He reports that his symptoms have improved.  He reports he is out of his home medications.   Past Medical History Past Medical History:  Diagnosis Date   Arthritis    Asthma    Gout    Renal disorder    Rib fracture    Patient Active Problem List   Diagnosis Date Noted   Traumatic rhabdomyolysis ([place])    Elevated troponin    AKI (acute kidney injury) ([place]) 01/26/2021   Elevated LFTs 01/26/2021   Alcohol abuse 01/26/2021   Polysubstance abuse ([place]) 01/26/2021   Diarrhea 01/26/2021   Home Medication(s) Prior to Admission medications   Medication Sig Start Date End Date Taking? Authorizing Provider  predniSONE (DELTASONE) 50 MG tablet Take 1 tablet (50 mg total) by mouth daily for 5 days. 07/17/22 07/22/22 Yes Cristie Hem, MD  albuterol (PROVENTIL) (2.5 MG/3ML) 0.083% nebulizer solution Take 3 mLs (2.5 mg total) by nebulization every 6 (six) hours as needed for wheezing or shortness of breath. 12/09/21   Roemhildt, Lorin T, PA-C  albuterol (VENTOLIN HFA) 108 (90 Base) MCG/ACT inhaler Inhale 2 puffs into the lungs every 6 (six) hours as needed for wheezing or shortness of  breath. 07/16/22   Cristie Hem, MD  amLODipine (NORVASC) 5 MG tablet Take 1 tablet (5 mg total) by mouth daily. 02/06/21 03/08/21  Barb Merino, MD  amoxicillin-clavulanate (AUGMENTIN) 875-125 MG tablet Take 1 tablet by mouth every 12 (twelve) hours. 12/09/21   Roemhildt, Lorin T, PA-C  gabapentin (NEURONTIN) 300 MG capsule Take 1 capsule (300 mg total) by mouth 3 (three) times daily. 04/14/21 05/14/21  Fatima Blank, MD  gabapentin (NEURONTIN) 800 MG tablet Take 2,400 mg by mouth at bedtime.    [provider]  ipratropium (ATROVENT HFA) 17 MCG/ACT inhaler Inhale 2 puffs into the lungs every 6 (six) hours as needed for wheezing.    [provider]  mometasone-formoterol (DULERA) 200-5 MCG/ACT AERO Inhale 2 puffs into the lungs 2 (two) times daily. 12/09/21   Roemhildt, Lorin T, PA-C  Past Surgical History History reviewed. No pertinent surgical history. Family History History reviewed. No pertinent family history.  Social History Social History   Tobacco Use   Smoking status: Every Day    Packs/day: .2    Types: Cigarettes  Substance Use Topics   Alcohol use: Yes    Alcohol/week: 15.0 standard drinks of alcohol    Types: 15 Cans of beer per week    Comment: daily   Drug use: Yes    Types: Marijuana, Cocaine, "Crack" cocaine    Comment: "crack sprinkled on top of tobacco"    Allergies Banana and Other  Review of Systems Review of Systems  All other systems reviewed and are negative.   Physical Exam Vital Signs  I have reviewed the triage vital signs BP (!) 156/96   Pulse 97   Temp (!) 97.5 F (36.4 C) (Oral)   Resp 13   Ht 5\' 8"  (1.727 m)   Wt 63.5 kg   SpO2 90%   BMI 21.29 kg/m  Physical Exam Vitals and nursing note reviewed.  Constitutional:      General: He is not in acute distress.    Appearance:  Normal appearance.  HENT:     Mouth/Throat:     Mouth: Mucous membranes are moist.  Eyes:     Conjunctiva/sclera: Conjunctivae normal.  Cardiovascular:     Rate and Rhythm: Normal rate and regular rhythm.  Pulmonary:     Comments: Normal work of breathing, diffuse wheezing Abdominal:     General: Abdomen is flat.     Palpations: Abdomen is soft.     Tenderness: There is no abdominal tenderness.  Musculoskeletal:     Right lower leg: No edema.     Left lower leg: No edema.  Skin:    General: Skin is warm and dry.     Capillary Refill: Capillary refill takes less than 2 seconds.  Neurological:     Mental Status: He is alert and oriented to person, place, and time. Mental status is at baseline.  Psychiatric:        Mood and Affect: Mood normal.        Behavior: Behavior normal.     ED Results and Treatments Labs (all labs ordered are listed, but only abnormal results are displayed) Labs Reviewed  COMPREHENSIVE METABOLIC PANEL - Abnormal; Notable for the following components:      Result Value   Potassium 3.4 (*)    Glucose, Bld 120 (*)    Calcium 8.8 (*)    Total Protein 6.4 (*)    All other components within normal limits  CBC WITH DIFFERENTIAL/PLATELET                                                                                                                          Radiology DG Chest Port 1 View  Result Date: 07/16/2022 CLINICAL DATA:  cough EXAM: PORTABLE CHEST - 1 VIEW COMPARISON:  None Available. FINDINGS: Cardiac silhouette  is unremarkable. No pneumothorax or pleural effusion. The lungs are clear. The visualized skeletal structures are unremarkable. IMPRESSION: No acute cardiopulmonary process. Electronically Signed   By: Sammie Bench M.D.   On: 07/16/2022 10:51    Pertinent labs & imaging results that were available during my care of the patient were reviewed by me and considered in my medical decision making (see MDM for details).  Medications Ordered  in ED Medications  albuterol (VENTOLIN HFA) 108 (90 Base) MCG/ACT inhaler 1 puff (1 puff Inhalation Given 07/16/22 1223)  lactated ringers bolus 1,000 mL (0 mLs Intravenous Stopped 07/16/22 1215)  ipratropium-albuterol (DUONEB) 0.5-2.5 (3) MG/3ML nebulizer solution 3 mL (3 mLs Nebulization Given 07/16/22 1038)  acetaminophen (TYLENOL) tablet 1,000 mg (1,000 mg Oral Given 07/16/22 1036)  ketorolac (TORADOL) 15 MG/ML injection 15 mg (15 mg Intravenous Given 07/16/22 1215)                                                                                                                                     Procedures Procedures  (including critical care time)  Medical Decision Making / ED Course   MDM:  52 year old male presenting to the emergency department with shortness of breath.  Patient well-appearing, vital signs reassuring.  Oxygen 96% on room air.  Does have diffuse wheezing but no increased work of breathing.  Patient reports significant improvement after additional breathing treatment.  Chest x-ray clear with no evidence of pneumonia, pneumothorax.  No chest pain to suggest ACS, pulmonary embolism.  Basic labs obtained given prior history of renal failure, reassuring with normal creatinine.  Will prescribe inhaler, steroids.  Will give inhaler here on discharge.  Patient also with headache.  Reports this is typical of his migraine headache.  He received Tylenol with some improvement.  Will give dose of Toradol given his creatinine is normal today.  His neurologic exam is normal, very low concern for acute process such as intracranial bleeding or mass effect, carbon monoxide poisoning.  Will discharge patient to home. All questions answered. Patient comfortable with plan of discharge. Return precautions discussed with patient and specified on the after visit summary.       Additional history obtained: -Additional history obtained from ems -External records from outside source  obtained and reviewed including: Chart review including previous notes, labs, imaging, consultation notes including ED visit 12/09/21   Lab Tests: -I ordered, reviewed, and interpreted labs.   The pertinent results include:   Labs Reviewed  COMPREHENSIVE METABOLIC PANEL - Abnormal; Notable for the following components:      Result Value   Potassium 3.4 (*)    Glucose, Bld 120 (*)    Calcium 8.8 (*)    Total Protein 6.4 (*)    All other components within normal limits  CBC WITH DIFFERENTIAL/PLATELET    Notable for very mild hypokalemia  EKG   EKG Interpretation  Date/Time:  Saturday July 16 2022  10:01:32 EDT Ventricular Rate:  87 PR Interval:  135 QRS Duration: 76 QT Interval:  353 QTC Calculation: 425 R Axis:   77 Text Interpretation: Sinus rhythm Confirmed by Garnette Gunner [phone]) on 07/16/2022 10:38:11 AM         Imaging Studies ordered: I ordered imaging studies including CXR On my interpretation imaging demonstrates no acute process I independently visualized and interpreted imaging. I agree with the radiologist interpretation   Medicines ordered and prescription drug management: Meds ordered this encounter  Medications   lactated ringers bolus 1,000 mL   ipratropium-albuterol (DUONEB) 0.5-2.5 (3) MG/3ML nebulizer solution 3 mL   acetaminophen (TYLENOL) tablet 1,000 mg   ketorolac (TORADOL) 15 MG/ML injection 15 mg   predniSONE (DELTASONE) 50 MG tablet    Sig: Take 1 tablet (50 mg total) by mouth daily for 5 days.    Dispense:  5 tablet    Refill:  0   albuterol (VENTOLIN HFA) 108 (90 Base) MCG/ACT inhaler    Sig: Inhale 2 puffs into the lungs every 6 (six) hours as needed for wheezing or shortness of breath.    Dispense:  18 g    Refill:  2   albuterol (VENTOLIN HFA) 108 (90 Base) MCG/ACT inhaler 1 puff    -I have reviewed the patients home medicines and have made adjustments as needed  Cardiac Monitoring: The patient was maintained on a cardiac  monitor.  I personally viewed and interpreted the cardiac monitored which showed an underlying rhythm of: NSR  Social Determinants of Health:  Diagnosis or treatment significantly limited by social determinants of health: polysubstance abuse   Reevaluation: After the interventions noted above, I reevaluated the patient and found that their symptoms have improved  Co morbidities that complicate the patient evaluation  Past Medical History:  Diagnosis Date   Arthritis    Asthma    Gout    Renal disorder    Rib fracture       Dispostion: Disposition decision including need for hospitalization was considered, and patient discharged from emergency department.    Final Clinical Impression(s) / ED Diagnoses Final diagnoses:  Mild intermittent asthma with exacerbation  Nonintractable headache, unspecified chronicity pattern, unspecified headache type     This chart was dictated using voice recognition software.  Despite best efforts to proofread,  errors can occur which can change the documentation meaning.    Cristie Hem, MD 07/16/22 1229

## 2022-07-16 NOTE — ED Triage Notes (Signed)
Pt BIB EMS due to SOB and asthma attack. Pt out of meds. Pt received Atrovent, albuterol, and solu-medrol. Pt is axox4. VSS.

## 2022-07-18 ENCOUNTER — Other Ambulatory Visit (HOSPITAL_COMMUNITY): Payer: Self-pay

## 2022-07-21 ENCOUNTER — Emergency Department (HOSPITAL_COMMUNITY): Payer: Medicaid Other

## 2022-07-21 ENCOUNTER — Other Ambulatory Visit: Payer: Self-pay

## 2022-07-21 ENCOUNTER — Encounter (HOSPITAL_COMMUNITY): Payer: Self-pay | Admitting: Emergency Medicine

## 2022-07-21 ENCOUNTER — Emergency Department (HOSPITAL_COMMUNITY)
Admission: EM | Admit: 2022-07-21 | Discharge: 2022-07-22 | Disposition: A | Discharge status: Home / Self Care | Payer: Medicaid Other | Attending: Emergency Medicine | Admitting: Emergency Medicine

## 2022-07-21 DIAGNOSIS — R42 Dizziness and giddiness: Secondary | ICD-10-CM | POA: Insufficient documentation

## 2022-07-21 DIAGNOSIS — R464 Slowness and poor responsiveness: Secondary | ICD-10-CM | POA: Diagnosis not present

## 2022-07-21 DIAGNOSIS — I10 Essential (primary) hypertension: Secondary | ICD-10-CM | POA: Diagnosis not present

## 2022-07-21 DIAGNOSIS — R404 Transient alteration of awareness: Secondary | ICD-10-CM | POA: Diagnosis not present

## 2022-07-21 DIAGNOSIS — J45909 Unspecified asthma, uncomplicated: Secondary | ICD-10-CM | POA: Insufficient documentation

## 2022-07-21 DIAGNOSIS — T50901A Poisoning by unspecified drugs, medicaments and biological substances, accidental (unintentional), initial encounter: Secondary | ICD-10-CM | POA: Diagnosis not present

## 2022-07-21 DIAGNOSIS — Z7951 Long term (current) use of inhaled steroids: Secondary | ICD-10-CM | POA: Insufficient documentation

## 2022-07-21 DIAGNOSIS — T50991A Poisoning by other drugs, medicaments and biological substances, accidental (unintentional), initial encounter: Secondary | ICD-10-CM | POA: Diagnosis not present

## 2022-07-21 DIAGNOSIS — T887XXA Unspecified adverse effect of drug or medicament, initial encounter: Secondary | ICD-10-CM | POA: Diagnosis not present

## 2022-07-21 LAB — COMPREHENSIVE METABOLIC PANEL
ALT: 33 U/L (ref 0–44)
AST: 35 U/L (ref 15–41)
Albumin: 4.4 g/dL (ref 3.5–5.0)
Alkaline Phosphatase: 48 U/L (ref 38–126)
Anion gap: 11 (ref 5–15)
BUN: 16 mg/dL (ref 6–20)
CO2: 19 mmol/L — ABNORMAL LOW (ref 22–32)
Calcium: 8.9 mg/dL (ref 8.9–10.3)
Chloride: 104 mmol/L (ref 98–111)
Creatinine, Ser: 1.36 mg/dL — ABNORMAL HIGH (ref 0.61–1.24)
GFR, Estimated: >60 mL/min (ref >60)
Glucose, Bld: 99 mg/dL (ref 70–99)
Potassium: 4.9 mmol/L (ref 3.5–5.1)
Sodium: 134 mmol/L — ABNORMAL LOW (ref 135–145)
Total Bilirubin: 0.7 mg/dL (ref 0.3–1.2)
Total Protein: 7.5 g/dL (ref 6.5–8.1)

## 2022-07-21 LAB — CBC WITH DIFFERENTIAL/PLATELET
Abs Immature Granulocytes: 0.08 10*3/uL — ABNORMAL HIGH (ref 0.00–0.07)
Basophils Absolute: 0.1 10*3/uL (ref 0.0–0.1)
Basophils Relative: 1 %
Eosinophils Absolute: 0.3 10*3/uL (ref 0.0–0.5)
Eosinophils Relative: 2 %
HCT: 43.4 % (ref 39.0–52.0)
Hemoglobin: 14.5 g/dL (ref 13.0–17.0)
Immature Granulocytes: 1 %
Lymphocytes Relative: 16 %
Lymphs Abs: 2.7 10*3/uL (ref 0.7–4.0)
MCH: 32.9 pg (ref 26.0–34.0)
MCHC: 33.4 g/dL (ref 30.0–36.0)
MCV: 98.4 fL (ref 80.0–100.0)
Monocytes Absolute: 1.4 10*3/uL — ABNORMAL HIGH (ref 0.1–1.0)
Monocytes Relative: 8 %
Neutro Abs: 12.5 10*3/uL — ABNORMAL HIGH (ref 1.7–7.7)
Neutrophils Relative %: 72 %
Platelets: 280 10*3/uL (ref 150–400)
RBC: 4.41 MIL/uL (ref 4.22–5.81)
RDW: 17.2 % — ABNORMAL HIGH (ref 11.5–15.5)
WBC: 17 10*3/uL — ABNORMAL HIGH (ref 4.0–10.5)
nRBC: 0 % (ref 0.0–0.2)

## 2022-07-21 LAB — ETHANOL: Alcohol, Ethyl (B): 36 mg/dL — ABNORMAL HIGH (ref <10)

## 2022-07-21 LAB — ACETAMINOPHEN LEVEL: Acetaminophen (Tylenol), Serum: <10 ug/mL — ABNORMAL LOW (ref 10–30)

## 2022-07-21 LAB — CBG MONITORING, ED: Glucose-Capillary: 95 mg/dL (ref 70–99)

## 2022-07-21 LAB — SALICYLATE LEVEL: Salicylate Lvl: <7 mg/dL — ABNORMAL LOW (ref 7.0–30.0)

## 2022-07-21 MED ORDER — SODIUM CHLORIDE 0.9 % IV BOLUS
1000.0000 mL | Freq: Once | INTRAVENOUS | Status: AC
  Administered 2022-07-21: 1000 mL via INTRAVENOUS

## 2022-07-21 MED ORDER — IPRATROPIUM-ALBUTEROL 0.5-2.5 (3) MG/3ML IN SOLN
3.0000 mL | Freq: Once | RESPIRATORY_TRACT | Status: DC
  Filled 2022-07-21: qty 3

## 2022-07-21 MED ORDER — NALOXONE HCL 2 MG/2ML IJ SOSY
1.0000 mg | PREFILLED_SYRINGE | Freq: Once | INTRAMUSCULAR | Status: AC
  Administered 2022-07-21: 1 mg via INTRAVENOUS
  Filled 2022-07-21: qty 2

## 2022-07-21 MED ORDER — ONDANSETRON HCL 4 MG/2ML IJ SOLN
4.0000 mg | Freq: Once | INTRAMUSCULAR | Status: AC
  Administered 2022-07-21: 4 mg via INTRAVENOUS
  Filled 2022-07-21: qty 2

## 2022-07-21 MED ORDER — SODIUM CHLORIDE 0.9 % IV SOLN: INTRAVENOUS | Status: DC

## 2022-07-21 NOTE — ED Notes (Signed)
Pt had positive response to Narcan, awake and talking, respiratory rate improved.

## 2022-07-21 NOTE — ED Triage Notes (Signed)
Pt in via [facility] after overdose, reported cocaine powder present on scene. EMS arrived to find pt in laundromat bathroom floor, apneic and unresponsive. Pt received BVM and 2mg  Narcan IN en route, then awakened and became responsive.  VS en route: 93HR 102/79 14RR 92% RA

## 2022-07-21 NOTE — ED Provider Notes (Signed)
[place] EMERGENCY DEPARTMENT AT [facility] Provider Note   CSN: [id number] Arrival date & time: 07/21/22  2140     History  Chief Complaint  Patient presents with   Drug Overdose    Frank [name] is a 52 y.o. male.  Pt is a 52 yo male with pmhx significant for htn, asthma, and polysubstance abuse.  EMS was called to a laundromat for an unresponsive person.  He was agonal when they arrived, he was bagged, and he was given 2 mg narcan IN and started breathing again.  He's been awake since then.  Pt said he feels dizzy.       Home Medications Prior to Admission medications   Medication Sig Start Date End Date Taking? Authorizing Provider  albuterol (PROVENTIL) (2.5 MG/3ML) 0.083% nebulizer solution Take 3 mLs (2.5 mg total) by nebulization every 6 (six) hours as needed for wheezing or shortness of breath. 12/09/21   Roemhildt, Lorin T, PA-C  albuterol (VENTOLIN HFA) 108 (90 Base) MCG/ACT inhaler Inhale 2 puffs into the lungs every 6 (six) hours as needed for wheezing or shortness of breath. 07/16/22   Cristie Hem, MD  amLODipine (NORVASC) 5 MG tablet Take 1 tablet (5 mg total) by mouth daily. 02/06/21 03/08/21  Barb Merino, MD  amoxicillin-clavulanate (AUGMENTIN) 875-125 MG tablet Take 1 tablet by mouth every 12 (twelve) hours. 12/09/21   Roemhildt, Lorin T, PA-C  gabapentin (NEURONTIN) 300 MG capsule Take 1 capsule (300 mg total) by mouth 3 (three) times daily. 04/14/21 05/14/21  Fatima Blank, MD  gabapentin (NEURONTIN) 800 MG tablet Take 2,400 mg by mouth at bedtime.    [provider]  ipratropium (ATROVENT HFA) 17 MCG/ACT inhaler Inhale 2 puffs into the lungs every 6 (six) hours as needed for wheezing.    [provider]  mometasone-formoterol (DULERA) 200-5 MCG/ACT AERO Inhale 2 puffs into the lungs 2 (two) times daily. 12/09/21   Roemhildt, Lorin T, PA-C  predniSONE (DELTASONE) 50 MG tablet Take 1 tablet (50 mg total) by mouth  daily for 5 days. 07/17/22 07/23/22  Cristie Hem, MD      Allergies    Banana and Other    Review of Systems   Review of Systems  Neurological:  Positive for dizziness.  All other systems reviewed and are negative.   Physical Exam Updated Vital Signs BP 119/86   Pulse 94   Temp (!) 96.5 F (35.8 C)   Resp (!) 23   Wt 63.5 kg   SpO2 100%   BMI 21.29 kg/m  Physical Exam Vitals and nursing note reviewed.  Constitutional:      Appearance: Normal appearance.  HENT:     Head: Normocephalic and atraumatic.     Right Ear: External ear normal.     Left Ear: External ear normal.     Nose: Nose normal.     Mouth/Throat:     Mouth: Mucous membranes are moist.     Pharynx: Oropharynx is clear.  Eyes:     Extraocular Movements: Extraocular movements intact.     Conjunctiva/sclera: Conjunctivae normal.     Pupils: Pupils are equal, round, and reactive to light.  Cardiovascular:     Rate and Rhythm: Normal rate and regular rhythm.     Pulses: Normal pulses.     Heart sounds: Normal heart sounds.  Pulmonary:     Effort: Pulmonary effort is normal.     Breath sounds: Normal breath sounds.  Abdominal:  General: Abdomen is flat. Bowel sounds are normal.     Palpations: Abdomen is soft.  Musculoskeletal:        General: Normal range of motion.     Cervical back: Normal range of motion and neck supple.  Skin:    General: Skin is warm.     Capillary Refill: Capillary refill takes less than 2 seconds.  Neurological:     General: No focal deficit present.     Mental Status: He is alert and oriented to person, place, and time.  Psychiatric:        Mood and Affect: Mood normal.        Behavior: Behavior normal.     ED Results / Procedures / Treatments   Labs (all labs ordered are listed, but only abnormal results are displayed) Labs Reviewed  COMPREHENSIVE METABOLIC PANEL - Abnormal; Notable for the following components:      Result Value   Sodium 134 (*)    CO2  19 (*)    Creatinine, Ser 1.36 (*)    All other components within normal limits  SALICYLATE LEVEL - Abnormal; Notable for the following components:   Salicylate Lvl [id number] (*)    All other components within normal limits  ACETAMINOPHEN LEVEL - Abnormal; Notable for the following components:   Acetaminophen (Tylenol), Serum <10 (*)    All other components within normal limits  ETHANOL - Abnormal; Notable for the following components:   Alcohol, Ethyl (B) 36 (*)    All other components within normal limits  CBC WITH DIFFERENTIAL/PLATELET - Abnormal; Notable for the following components:   WBC 17.0 (*)    RDW 17.2 (*)    Neutro Abs 12.5 (*)    Monocytes Absolute 1.4 (*)    Abs Immature Granulocytes 0.08 (*)    All other components within normal limits  RAPID URINE DRUG SCREEN, HOSP PERFORMED  URINALYSIS, ROUTINE W REFLEX MICROSCOPIC  CBG MONITORING, ED    EKG EKG Interpretation  Date/Time:  Thursday July 21 2022 21:51:27 EDT Ventricular Rate:  81 PR Interval:  140 QRS Duration: 95 QT Interval:  380 QTC Calculation: 442 R Axis:   81 Text Interpretation: Sinus rhythm Nonspecific T abnrm, anterolateral leads ST elev, probable normal early repol pattern No significant change since last tracing Confirmed by Isla Pence [phone]) on 07/21/2022 11:03:55 PM  Radiology DG Chest Port 1 View  Result Date: 07/21/2022 CLINICAL DATA:  Recent overdose EXAM: PORTABLE CHEST 1 VIEW COMPARISON:  07/16/2022 FINDINGS: Cardiac shadow is within normal limits. The lungs are well aerated bilaterally. No focal infiltrate or sizable effusion is seen. No bony abnormality is noted. IMPRESSION: No active disease. Electronically Signed   By: Inez Catalina M.D.   On: 07/21/2022 22:09    Procedures Procedures    Medications Ordered in ED Medications  sodium chloride 0.9 % bolus 1,000 mL (1,000 mLs Intravenous New Bag/Given 07/21/22 2220)    And  0.9 %  sodium chloride infusion (has no administration in  time range)  ipratropium-albuterol (DUONEB) 0.5-2.5 (3) MG/3ML nebulizer solution 3 mL (has no administration in time range)  naloxone [facility]) injection 1 mg (1 mg Intravenous Given 07/21/22 2300)  ondansetron (ZOFRAN) injection 4 mg (4 mg Intravenous Given 07/21/22 2300)    ED Course/ Medical Decision Making/ A&P                             Medical Decision Making Amount and/or Complexity  of Data Reviewed Labs: ordered. Radiology: ordered.  Risk Prescription drug management.   This patient presents to the ED for concern of overdose, this involves an extensive number of treatment options, and is a complaint that carries with it a high risk of complications and morbidity.  The differential diagnosis includes overdose   Co morbidities that complicate the patient evaluation   htn, asthma, and polysubstance abuse   Additional history obtained:  Additional history obtained from epic chart review External records from outside source obtained and reviewed including EMS report   Lab Tests:  I Ordered, and personally interpreted labs.  The pertinent results include:  wbc elevated at 17, otherwise CBC nl, cmp nl other than cr sl elevated at [id number] (chronic), salicylate and acet levels neg   Imaging Studies ordered:  I ordered imaging studies including cxr  I independently visualized and interpreted imaging which showed No active disease.  I agree with the radiologist interpretation   Cardiac Monitoring:  The patient was maintained on a cardiac monitor.  I personally viewed and interpreted the cardiac monitored which showed an underlying rhythm of: nsr   Medicines ordered and prescription drug management:  I ordered medication including narcan  for overdose  Reevaluation of the patient after these medicines showed that the patient improved I have reviewed the patients home medicines and have made adjustments as needed  Problem List / ED Course:  Opiate OD:  pt has required  additional narcan while here.  He will need to be observed  until he is back to normal.  Once he is back to normal, he can go.   Reevaluation:  After the interventions noted above, I reevaluated the patient and found that they have :improved   Social Determinants of Health:  ? Homeless/ no pcp   Dispostion:  After consideration of the diagnostic results and the patients response to treatment, I feel that the patent would benefit from discharge with outpatient f/u.          Final Clinical Impression(s) / ED Diagnoses Final diagnoses:  Accidental drug overdose, initial encounter    Rx / DC Orders ED Discharge Orders     None         Isla Pence, MD 07/21/22 2307

## 2022-07-21 NOTE — ED Notes (Signed)
Pt becoming harder to arouse, longer periods of apnea noted. Dr. Gilford Raid made aware, rounded at bedside. 1mg  Narcan ordered at this time

## 2022-07-22 LAB — URINALYSIS, ROUTINE W REFLEX MICROSCOPIC
Bacteria, UA: NONE SEEN
Bilirubin Urine: NEGATIVE
Glucose, UA: NEGATIVE mg/dL
Ketones, ur: NEGATIVE mg/dL
Leukocytes,Ua: NEGATIVE
Nitrite: NEGATIVE
Protein, ur: NEGATIVE mg/dL
Specific Gravity, Urine: 1.014 (ref 1.005–1.030)
pH: 5 (ref 5.0–8.0)

## 2022-07-22 LAB — RAPID URINE DRUG SCREEN, HOSP PERFORMED
Amphetamines: NOT DETECTED
Barbiturates: NOT DETECTED
Benzodiazepines: NOT DETECTED
Cocaine: POSITIVE — AB
Opiates: NOT DETECTED
Tetrahydrocannabinol: NOT DETECTED

## 2022-07-22 MED ORDER — NALOXONE HCL 2 MG/2ML IJ SOSY
1.0000 mg | PREFILLED_SYRINGE | Freq: Once | INTRAMUSCULAR | Status: AC
  Administered 2022-07-22: 1 mg via INTRAVENOUS
  Filled 2022-07-22: qty 2

## 2022-07-22 NOTE — ED Provider Notes (Signed)
Signed out to me by Dr. Gilford Raid.  Patient was seen for drug overdose.  Patient responded to Narcan.  During my period of observation he became very dyspneic and somnolent again, required repeat dose of Narcan.  He has been monitored for 4 more hours.  He is now awake and alert, watching TV.  He is ready for discharge.   Orpah Greek, MD 07/22/22 [phone]

## 2022-07-22 NOTE — ED Notes (Signed)
Patient verbalizes understanding of discharge instructions. Opportunity for questioning and answers were provided. Armband removed by staff, pt discharged from ED. Provided bus pass for transport home

## 2022-07-25 DIAGNOSIS — R069 Unspecified abnormalities of breathing: Secondary | ICD-10-CM | POA: Diagnosis not present

## 2022-07-28 ENCOUNTER — Other Ambulatory Visit (HOSPITAL_COMMUNITY): Payer: Self-pay

## 2022-08-16 DIAGNOSIS — R059 Cough, unspecified: Secondary | ICD-10-CM | POA: Diagnosis not present

## 2022-08-16 DIAGNOSIS — R062 Wheezing: Secondary | ICD-10-CM | POA: Diagnosis not present

## 2022-11-15 ENCOUNTER — Other Ambulatory Visit: Payer: Self-pay

## 2022-11-15 ENCOUNTER — Encounter (HOSPITAL_COMMUNITY): Payer: Self-pay

## 2022-11-15 ENCOUNTER — Emergency Department (HOSPITAL_COMMUNITY)
Admission: EM | Admit: 2022-11-15 | Discharge: 2022-11-15 | Disposition: A | Discharge status: Home / Self Care | Payer: Commercial Managed Care - HMO | Attending: Emergency Medicine | Admitting: Emergency Medicine

## 2022-11-15 ENCOUNTER — Emergency Department (HOSPITAL_COMMUNITY): Payer: Commercial Managed Care - HMO

## 2022-11-15 DIAGNOSIS — J4521 Mild intermittent asthma with (acute) exacerbation: Secondary | ICD-10-CM | POA: Insufficient documentation

## 2022-11-15 DIAGNOSIS — R0602 Shortness of breath: Secondary | ICD-10-CM | POA: Diagnosis present

## 2022-11-15 LAB — CBC
HCT: 44.8 % (ref 39.0–52.0)
Hemoglobin: 15.4 g/dL (ref 13.0–17.0)
MCH: 33.3 pg (ref 26.0–34.0)
MCHC: 34.4 g/dL (ref 30.0–36.0)
MCV: 96.8 fL (ref 80.0–100.0)
Platelets: 334 10*3/uL (ref 150–400)
RBC: 4.63 MIL/uL (ref 4.22–5.81)
RDW: 13.8 % (ref 11.5–15.5)
WBC: 8.1 10*3/uL (ref 4.0–10.5)
nRBC: 0 % (ref 0.0–0.2)

## 2022-11-15 LAB — TROPONIN I (HIGH SENSITIVITY): Troponin I (High Sensitivity): 3 ng/L (ref <18)

## 2022-11-15 LAB — BASIC METABOLIC PANEL
Anion gap: 10 (ref 5–15)
BUN: 14 mg/dL (ref 6–20)
CO2: 26 mmol/L (ref 22–32)
Calcium: 9.2 mg/dL (ref 8.9–10.3)
Chloride: 104 mmol/L (ref 98–111)
Creatinine, Ser: 1.33 mg/dL — ABNORMAL HIGH (ref 0.61–1.24)
GFR, Estimated: >60 mL/min (ref >60)
Glucose, Bld: 111 mg/dL — ABNORMAL HIGH (ref 70–99)
Potassium: 3.9 mmol/L (ref 3.5–5.1)
Sodium: 140 mmol/L (ref 135–145)

## 2022-11-15 MED ORDER — ALBUTEROL SULFATE HFA 108 (90 BASE) MCG/ACT IN AERS
INHALATION_SPRAY | RESPIRATORY_TRACT | Status: AC
  Filled 2022-11-15: qty 6.7

## 2022-11-15 MED ORDER — ALBUTEROL SULFATE HFA 108 (90 BASE) MCG/ACT IN AERS: 2.0000 | INHALATION_SPRAY | Freq: Four times a day (QID) | RESPIRATORY_TRACT | Status: DC | PRN

## 2022-11-15 MED ORDER — PREDNISONE 50 MG PO TABS: 50.0000 mg | ORAL_TABLET | Freq: Every day | ORAL | 0 refills | Status: DC

## 2022-11-15 NOTE — ED Triage Notes (Signed)
The pt bib EMS. The pt has a hx of asthma. He was walking to work and became sob with wheezing. He called EMS and he had inspiratory and expiratory wheezes throughout. He has been given 2 duonebs and wheezing has resolved. He has had 125 mg of solumedrol IV through an 18g LFA. VS B/P 119/87, P 85, O2Sats 100% on tx, R 18.

## 2022-11-15 NOTE — ED Provider Notes (Signed)
[place] EMERGENCY DEPARTMENT AT [facility] Provider Note   CSN: [id number] Arrival date & time: 11/15/22  7106     History  Chief complaint: Shortness of breath.  Frank [name] is a 52 y.o. male.  HPI   Pt presents to the ED with shortness of breath.  Pt has history of asthma.  He has been having trouble the last couple of days.  Pt was wheezing this am at work.  He did not have any medications.  He had to call EMS.  Pt was given  breathing treatments and Solu-Medrol.  Pt is feeling much better.  He does have some soreness in his left chest with coughing.  No pe or dvt.  No cad  Home Medications Prior to Admission medications   Medication Sig Start Date End Date Taking? Authorizing Provider  predniSONE (DELTASONE) 50 MG tablet Take 1 tablet (50 mg total) by mouth daily. 11/15/22  Yes Linwood Dibbles, MD  albuterol (PROVENTIL) (2.5 MG/3ML) 0.083% nebulizer solution Take 3 mLs (2.5 mg total) by nebulization every 6 (six) hours as needed for wheezing or shortness of breath. 12/09/21   Roemhildt, Lorin T, PA-C  albuterol (VENTOLIN HFA) 108 (90 Base) MCG/ACT inhaler Inhale 2 puffs into the lungs every 6 (six) hours as needed for wheezing or shortness of breath. 07/16/22   Lonell Grandchild, MD  amLODipine (NORVASC) 5 MG tablet Take 1 tablet (5 mg total) by mouth daily. 02/06/21 03/08/21  Dorcas Carrow, MD  amoxicillin-clavulanate (AUGMENTIN) 875-125 MG tablet Take 1 tablet by mouth every 12 (twelve) hours. Patient not taking: Reported on 07/22/2022 12/09/21   Roemhildt, Lorin T, PA-C  gabapentin (NEURONTIN) 300 MG capsule Take 1 capsule (300 mg total) by mouth 3 (three) times daily. 04/14/21 05/14/21  Nira Conn, MD  mometasone-formoterol (DULERA) 200-5 MCG/ACT AERO Inhale 2 puffs into the lungs 2 (two) times daily. Patient not taking: Reported on 07/22/2022 12/09/21   Roemhildt, Lorin T, PA-C      Allergies    Banana and Other    Review of Systems   Review of  Systems  Physical Exam Updated Vital Signs BP (!) 134/93   Pulse 87   Temp 97.7 F (36.5 C) (Axillary)   Resp 19   Ht 1.727 m (5\' 8" )   Wt 63 kg   SpO2 96%   BMI 21.13 kg/m  Physical Exam Vitals and nursing note reviewed.  Constitutional:      General: He is not in acute distress.    Appearance: He is well-developed.  HENT:     Head: Normocephalic and atraumatic.     Right Ear: External ear normal.     Left Ear: External ear normal.  Eyes:     General: No scleral icterus.       Right eye: No discharge.        Left eye: No discharge.     Conjunctiva/sclera: Conjunctivae normal.  Neck:     Trachea: No tracheal deviation.  Cardiovascular:     Rate and Rhythm: Normal rate and regular rhythm.  Pulmonary:     Effort: Pulmonary effort is normal. No respiratory distress.     Breath sounds: Normal breath sounds. No stridor. No wheezing or rales.  Abdominal:     General: Bowel sounds are normal. There is no distension.     Palpations: Abdomen is soft.     Tenderness: There is no abdominal tenderness. There is no guarding or rebound.  Musculoskeletal:  General: No tenderness or deformity.     Cervical back: Neck supple.  Skin:    General: Skin is warm and dry.     Findings: No rash.  Neurological:     General: No focal deficit present.     Mental Status: He is alert.     Cranial Nerves: No cranial nerve deficit, dysarthria or facial asymmetry.     Sensory: No sensory deficit.     Motor: No abnormal muscle tone or seizure activity.     Coordination: Coordination normal.  Psychiatric:        Mood and Affect: Mood normal.     ED Results / Procedures / Treatments   Labs (all labs ordered are listed, but only abnormal results are displayed) Labs Reviewed  BASIC METABOLIC PANEL - Abnormal; Notable for the following components:      Result Value   Glucose, Bld 111 (*)    Creatinine, Ser 1.33 (*)    All other components within normal limits  CBC  TROPONIN I (HIGH  SENSITIVITY)    EKG EKG Interpretation Date/Time:  Tuesday November 15 2022 10:21:50 EDT Ventricular Rate:  83 PR Interval:  124 QRS Duration:  92 QT Interval:  358 QTC Calculation: 421 R Axis:   79  Text Interpretation: Sinus rhythm Atrial premature complex ST elev, probable normal early repol pattern No significant change since last tracing Confirmed by Linwood Dibbles [phone]) on 11/15/2022 10:26:17 AM  Radiology DG Chest 2 View  Result Date: 11/15/2022 CLINICAL DATA:  dyspnea EXAM: CHEST - 2 VIEW COMPARISON:  07/21/2022 FINDINGS: Lungs are clear. Heart size and mediastinal contours are within normal limits. No effusion. Vertebral endplate spurring at multiple levels in the lower thoracic spine. IMPRESSION: No acute cardiopulmonary disease. Electronically Signed   By: Corlis Leak M.D.   On: 11/15/2022 12:33    Procedures Procedures    Medications Ordered in ED Medications  albuterol (VENTOLIN HFA) 108 (90 Base) MCG/ACT inhaler 2 puff (has no administration in time range)    ED Course/ Medical Decision Making/ A&P Clinical Course as of 11/15/22 1312  Tue Nov 15, 2022  1220 CBC metabolic panel normal with exception of elevated creatinine, unchanged from baseline.  Troponin normal [JK]  1245 Chest x-ray without acute findings [JK]    Clinical Course User Index [JK] Linwood Dibbles, MD                             Medical Decision Making Problems Addressed: Mild intermittent asthma with exacerbation: acute illness or injury that poses a threat to life or bodily functions  Amount and/or Complexity of Data Reviewed Labs: ordered. Decision-making details documented in ED Course. Radiology: ordered and independent interpretation performed.  Risk Prescription drug management.   Patient presented to the ED for evaluation of shortness of breath while at work.  Patient did not have an inhaler with him.  He does have history of asthma.  Patient was treated by EMS with DuoNebs and Solu-Medrol.   Symptoms had resolved by the time he arrived in the ED.  Patient did not have any recurrent wheezing.  No evidence of pneumonia or pneumothorax on x-ray.  Laboratory testing unremarkable.  Patient appears appropriate for discharge for outpatient management of his asthma.  Will give him an inhaler and a prescription for prednisone        Final Clinical Impression(s) / ED Diagnoses Final diagnoses:  Mild intermittent asthma with exacerbation  Rx / DC Orders ED Discharge Orders          Ordered    predniSONE (DELTASONE) 50 MG tablet  Daily        11/15/22 1307              Linwood Dibbles, MD 11/15/22 1314

## 2022-11-15 NOTE — Discharge Instructions (Addendum)
Continue to quit smoking as you have been doing.  Take the inhaler as needed for wheezing and shortness of breath.  Take the prednisone until it is complete.  Follow-up with a primary care doctor to be rechecked

## 2022-11-15 NOTE — ED Notes (Signed)
Pt discharged home. Discharge information discussed. No s/s of distress observed during discharge. 

## 2022-11-24 ENCOUNTER — Encounter (HOSPITAL_COMMUNITY): Payer: Self-pay

## 2022-11-24 ENCOUNTER — Emergency Department (HOSPITAL_COMMUNITY)
Admission: EM | Admit: 2022-11-24 | Discharge: 2022-11-24 | Disposition: A | Discharge status: Home / Self Care | Payer: Commercial Managed Care - HMO | Attending: Emergency Medicine | Admitting: Emergency Medicine

## 2022-11-24 ENCOUNTER — Emergency Department (HOSPITAL_COMMUNITY): Payer: Commercial Managed Care - HMO

## 2022-11-24 ENCOUNTER — Other Ambulatory Visit: Payer: Self-pay

## 2022-11-24 DIAGNOSIS — Z1152 Encounter for screening for COVID-19: Secondary | ICD-10-CM | POA: Diagnosis not present

## 2022-11-24 DIAGNOSIS — R0602 Shortness of breath: Secondary | ICD-10-CM | POA: Diagnosis present

## 2022-11-24 DIAGNOSIS — J45901 Unspecified asthma with (acute) exacerbation: Secondary | ICD-10-CM

## 2022-11-24 DIAGNOSIS — J45909 Unspecified asthma, uncomplicated: Secondary | ICD-10-CM | POA: Diagnosis not present

## 2022-11-24 HISTORY — DX: Unspecified kidney failure: N19

## 2022-11-24 HISTORY — DX: Bronchitis, not specified as acute or chronic: J40

## 2022-11-24 LAB — SARS CORONAVIRUS 2 BY RT PCR: SARS Coronavirus 2 by RT PCR: NEGATIVE

## 2022-11-24 MED ORDER — PREDNISONE 20 MG PO TABS
60.0000 mg | ORAL_TABLET | Freq: Once | ORAL | Status: AC
  Administered 2022-11-24: 60 mg via ORAL
  Filled 2022-11-24: qty 3

## 2022-11-24 MED ORDER — ACETAMINOPHEN 325 MG PO TABS
650.0000 mg | ORAL_TABLET | Freq: Once | ORAL | Status: AC
  Administered 2022-11-24: 650 mg via ORAL
  Filled 2022-11-24: qty 2

## 2022-11-24 MED ORDER — PREDNISONE 20 MG PO TABS: 60.0000 mg | ORAL_TABLET | Freq: Every day | ORAL | 0 refills | Status: AC

## 2022-11-24 MED ORDER — IPRATROPIUM-ALBUTEROL 0.5-2.5 (3) MG/3ML IN SOLN
3.0000 mL | Freq: Once | RESPIRATORY_TRACT | Status: AC
  Administered 2022-11-24: 3 mL via RESPIRATORY_TRACT
  Filled 2022-11-24: qty 3

## 2022-11-24 MED ORDER — ALBUTEROL SULFATE HFA 108 (90 BASE) MCG/ACT IN AERS
2.0000 | INHALATION_SPRAY | Freq: Once | RESPIRATORY_TRACT | Status: AC
  Administered 2022-11-24: 2 via RESPIRATORY_TRACT
  Filled 2022-11-24: qty 6.7

## 2022-11-24 NOTE — Discharge Instructions (Addendum)
Take prednisone for the next 5 days to try and reduce asthma exacerbation.  Use albuterol inhaler 2 puffs every 4-6 hours for the next 24 hours and then as needed.  Return for new or worsening symptoms.

## 2022-11-24 NOTE — ED Triage Notes (Signed)
Patient was brought in by [facility], patient called out for shortness of breath, wheezing present. Patient has history of asthma. Per [facility] he called at midnight and got a breathing treatment with alleviation of symptoms. Patient called back this morning for more wheezing. Patient A&OX4, duoneb ongoing.

## 2022-11-24 NOTE — ED Provider Notes (Signed)
[place] EMERGENCY DEPARTMENT AT [facility] Provider Note   CSN: [id number] Arrival date & time: 11/24/22  4098     History  Chief Complaint  Patient presents with   Shortness of Breath    Frank [name] is a 52 y.o. male.  Frank [name] is a 52 y.o. male with a history of asthma, who presents to the emergency department via EMS for evaluation of shortness of breath.  Per EMS they had a similar call for him at midnight and he received a breathing treatment, symptoms were alleviated and he was not transported to the hospital.  Called back this morning with worsening wheezing, given DuoNeb en route.  Patient reports shortness of breath and wheezing worsened this morning when he woke up, he did not have a rescue inhaler to use.  He reports an occasional nonproductive cough.  No fevers or chills, no nasal congestion.  He denies any associated chest pain and reports this feels like his typical asthma exacerbation.  The history is provided by the patient, the EMS personnel and medical records.  Shortness of Breath Associated symptoms: cough and wheezing   Associated symptoms: no chest pain and no fever        Home Medications Prior to Admission medications   Not on File      Allergies    Patient has no known allergies.    Review of Systems   Review of Systems  Constitutional:  Negative for chills and fever.  HENT:  Negative for congestion and rhinorrhea.   Respiratory:  Positive for cough, shortness of breath and wheezing.   Cardiovascular:  Negative for chest pain.  All other systems reviewed and are negative.   Physical Exam Updated Vital Signs BP (!) 146/105 (BP Location: Right Arm)   Pulse 81   Temp 97.8 F (36.6 C) (Oral)   Resp 19  Physical Exam Vitals and nursing note reviewed.  Constitutional:      General: He is not in acute distress.    Appearance: Normal appearance. He is well-developed. He is not ill-appearing or diaphoretic.  HENT:      Head: Normocephalic and atraumatic.  Eyes:     General:        Right eye: No discharge.        Left eye: No discharge.  Pulmonary:     Effort: Pulmonary effort is normal. No respiratory distress.     Breath sounds: Wheezing present.     Comments: Respirations are equal and unlabored, patient is satting well on room air without retractions, on auscultation he does have some mild scattered expiratory wheezing. Neurological:     Mental Status: He is alert and oriented to person, place, and time.     Coordination: Coordination normal.  Psychiatric:        Mood and Affect: Mood normal.        Behavior: Behavior normal.     ED Results / Procedures / Treatments   Labs (all labs ordered are listed, but only abnormal results are displayed) Labs Reviewed  SARS CORONAVIRUS 2 BY RT PCR    EKG None  Radiology DG Chest 2 View  Result Date: 11/24/2022 CLINICAL DATA:  Cough. EXAM: CHEST - 2 VIEW COMPARISON:  None Available. FINDINGS: The heart size and mediastinal contours are within normal limits. Both lungs are clear. The visualized skeletal structures are unremarkable. IMPRESSION: No active cardiopulmonary disease. Electronically Signed   By: Lupita Raider M.D.   On: 11/24/2022  08:04    Procedures Procedures    Medications Ordered in ED Medications - No data to display  ED Course/ Medical Decision Making/ A&P                                 Medical Decision Making Amount and/or Complexity of Data Reviewed Radiology: ordered.  Risk OTC drugs. Prescription drug management.   Patient presents with wheezing shortness of breath and cough that feels like his usual asthma exacerbation, did not have a rescue inhaler and called EMS.  On exam patient is in no distress breathing comfortably with sats of 99% on room air.  Faint wheezing noted.  After DuoNeb and steroids wheezing has resolved and patient reports feeling much better.  Chest x-ray obtained and viewed and interpreted  independently, no evidence of infiltrate or other active cardiopulmonary disease.  COVID-negative.  Patient given rescue inhaler here in the ED.  Given prescription for steroid burst and follow-up resources for improved chronic asthma management.  Counseled to avoid smoking as it will likely worsen his asthma.        Final Clinical Impression(s) / ED Diagnoses Final diagnoses:  Exacerbation of asthma, unspecified asthma severity, unspecified whether persistent    Rx / DC Orders ED Discharge Orders          Ordered    predniSONE (DELTASONE) 20 MG tablet  Daily        11/24/22 1020              Jodi Geralds [place], [place] 12/21/22 1619    Vanetta Mulders, MD 12/22/22 [phone]

## 2022-12-06 ENCOUNTER — Encounter (HOSPITAL_COMMUNITY): Payer: Self-pay

## 2022-12-06 ENCOUNTER — Other Ambulatory Visit: Payer: Self-pay

## 2022-12-06 ENCOUNTER — Observation Stay (HOSPITAL_COMMUNITY)
Admission: EM | Admit: 2022-12-06 | Discharge: 2022-12-07 | Disposition: A | Discharge status: Home / Self Care | Payer: Commercial Managed Care - HMO | Attending: Family Medicine | Admitting: Family Medicine

## 2022-12-06 ENCOUNTER — Emergency Department (HOSPITAL_COMMUNITY): Payer: Commercial Managed Care - HMO

## 2022-12-06 DIAGNOSIS — F17210 Nicotine dependence, cigarettes, uncomplicated: Secondary | ICD-10-CM | POA: Insufficient documentation

## 2022-12-06 DIAGNOSIS — R739 Hyperglycemia, unspecified: Secondary | ICD-10-CM | POA: Diagnosis not present

## 2022-12-06 DIAGNOSIS — I10 Essential (primary) hypertension: Secondary | ICD-10-CM | POA: Diagnosis present

## 2022-12-06 DIAGNOSIS — J9601 Acute respiratory failure with hypoxia: Secondary | ICD-10-CM | POA: Diagnosis not present

## 2022-12-06 DIAGNOSIS — Z79899 Other long term (current) drug therapy: Secondary | ICD-10-CM | POA: Diagnosis not present

## 2022-12-06 DIAGNOSIS — J45901 Unspecified asthma with (acute) exacerbation: Secondary | ICD-10-CM | POA: Insufficient documentation

## 2022-12-06 DIAGNOSIS — R0602 Shortness of breath: Secondary | ICD-10-CM | POA: Diagnosis present

## 2022-12-06 DIAGNOSIS — J9621 Acute and chronic respiratory failure with hypoxia: Secondary | ICD-10-CM | POA: Diagnosis not present

## 2022-12-06 DIAGNOSIS — F1010 Alcohol abuse, uncomplicated: Secondary | ICD-10-CM | POA: Diagnosis present

## 2022-12-06 DIAGNOSIS — R109 Unspecified abdominal pain: Secondary | ICD-10-CM | POA: Diagnosis not present

## 2022-12-06 DIAGNOSIS — Z1152 Encounter for screening for COVID-19: Secondary | ICD-10-CM | POA: Diagnosis not present

## 2022-12-06 DIAGNOSIS — J441 Chronic obstructive pulmonary disease with (acute) exacerbation: Secondary | ICD-10-CM | POA: Diagnosis present

## 2022-12-06 LAB — COMPREHENSIVE METABOLIC PANEL
ALT: 25 U/L (ref 0–44)
AST: 32 U/L (ref 15–41)
Albumin: 3.6 g/dL (ref 3.5–5.0)
Alkaline Phosphatase: 51 U/L (ref 38–126)
Anion gap: 9 (ref 5–15)
BUN: 8 mg/dL (ref 6–20)
CO2: 26 mmol/L (ref 22–32)
Calcium: 9 mg/dL (ref 8.9–10.3)
Chloride: 103 mmol/L (ref 98–111)
Creatinine, Ser: 0.94 mg/dL (ref 0.61–1.24)
GFR, Estimated: >60 mL/min (ref >60)
Glucose, Bld: 145 mg/dL — ABNORMAL HIGH (ref 70–99)
Potassium: 3.6 mmol/L (ref 3.5–5.1)
Sodium: 138 mmol/L (ref 135–145)
Total Bilirubin: 0.6 mg/dL (ref 0.3–1.2)
Total Protein: 6.7 g/dL (ref 6.5–8.1)

## 2022-12-06 LAB — BLOOD GAS, VENOUS
Acid-Base Excess: 0.7 mmol/L (ref 0.0–2.0)
Bicarbonate: 27.5 mmol/L (ref 20.0–28.0)
O2 Saturation: 71.1 %
Patient temperature: 37
pCO2, Ven: 51 mmHg (ref 44–60)
pH, Ven: 7.34 (ref 7.25–7.43)
pO2, Ven: 40 mmHg (ref 32–45)

## 2022-12-06 LAB — CBC WITH DIFFERENTIAL/PLATELET
Abs Immature Granulocytes: 0.02 10*3/uL (ref 0.00–0.07)
Basophils Absolute: 0 10*3/uL (ref 0.0–0.1)
Basophils Relative: 1 %
Eosinophils Absolute: 0.1 10*3/uL (ref 0.0–0.5)
Eosinophils Relative: 1 %
HCT: 42.8 % (ref 39.0–52.0)
Hemoglobin: 14.6 g/dL (ref 13.0–17.0)
Immature Granulocytes: 0 %
Lymphocytes Relative: 9 %
Lymphs Abs: 0.6 10*3/uL — ABNORMAL LOW (ref 0.7–4.0)
MCH: 33.8 pg (ref 26.0–34.0)
MCHC: 34.1 g/dL (ref 30.0–36.0)
MCV: 99.1 fL (ref 80.0–100.0)
Monocytes Absolute: 0.2 10*3/uL (ref 0.1–1.0)
Monocytes Relative: 3 %
Neutro Abs: 5.5 10*3/uL (ref 1.7–7.7)
Neutrophils Relative %: 86 %
Platelets: 337 10*3/uL (ref 150–400)
RBC: 4.32 MIL/uL (ref 4.22–5.81)
RDW: 13.5 % (ref 11.5–15.5)
WBC: 6.4 10*3/uL (ref 4.0–10.5)
nRBC: 0 % (ref 0.0–0.2)

## 2022-12-06 LAB — RESP PANEL BY RT-PCR (RSV, FLU A&B, COVID)  RVPGX2
Influenza A by PCR: NEGATIVE
Influenza B by PCR: NEGATIVE
Resp Syncytial Virus by PCR: NEGATIVE
SARS Coronavirus 2 by RT PCR: NEGATIVE

## 2022-12-06 LAB — TROPONIN I (HIGH SENSITIVITY)
Troponin I (High Sensitivity): 4 ng/L (ref <18)
Troponin I (High Sensitivity): 3 ng/L (ref <18)

## 2022-12-06 LAB — MAGNESIUM: Magnesium: 1.9 mg/dL (ref 1.7–2.4)

## 2022-12-06 LAB — BRAIN NATRIURETIC PEPTIDE: B Natriuretic Peptide: 22.2 pg/mL (ref 0.0–100.0)

## 2022-12-06 MED ORDER — THIAMINE MONONITRATE 100 MG PO TABS
100.0000 mg | ORAL_TABLET | Freq: Every day | ORAL | Status: DC
  Administered 2022-12-06 – 2022-12-07 (×2): 100 mg via ORAL
  Filled 2022-12-06 (×2): qty 1

## 2022-12-06 MED ORDER — ALBUTEROL SULFATE (2.5 MG/3ML) 0.083% IN NEBU
2.5000 mg | INHALATION_SOLUTION | RESPIRATORY_TRACT | Status: DC | PRN
  Administered 2022-12-07: 2.5 mg via RESPIRATORY_TRACT
  Filled 2022-12-06: qty 3

## 2022-12-06 MED ORDER — ONDANSETRON HCL 4 MG PO TABS: 4.0000 mg | ORAL_TABLET | Freq: Four times a day (QID) | ORAL | Status: DC | PRN

## 2022-12-06 MED ORDER — IPRATROPIUM-ALBUTEROL 0.5-2.5 (3) MG/3ML IN SOLN
3.0000 mL | Freq: Once | RESPIRATORY_TRACT | Status: AC
  Administered 2022-12-06: 3 mL via RESPIRATORY_TRACT
  Filled 2022-12-06: qty 3

## 2022-12-06 MED ORDER — ACETAMINOPHEN 650 MG RE SUPP: 650.0000 mg | Freq: Four times a day (QID) | RECTAL | Status: DC | PRN

## 2022-12-06 MED ORDER — ONDANSETRON HCL 4 MG/2ML IJ SOLN: 4.0000 mg | Freq: Four times a day (QID) | INTRAMUSCULAR | Status: DC | PRN

## 2022-12-06 MED ORDER — PREDNISONE 20 MG PO TABS
40.0000 mg | ORAL_TABLET | Freq: Every day | ORAL | Status: DC
  Administered 2022-12-07: 40 mg via ORAL
  Filled 2022-12-06: qty 2

## 2022-12-06 MED ORDER — ACETAMINOPHEN 325 MG PO TABS
650.0000 mg | ORAL_TABLET | Freq: Four times a day (QID) | ORAL | Status: DC | PRN
  Administered 2022-12-06: 650 mg via ORAL
  Filled 2022-12-06: qty 2

## 2022-12-06 MED ORDER — IPRATROPIUM-ALBUTEROL 0.5-2.5 (3) MG/3ML IN SOLN
3.0000 mL | Freq: Four times a day (QID) | RESPIRATORY_TRACT | Status: DC
  Administered 2022-12-06 – 2022-12-07 (×5): 3 mL via RESPIRATORY_TRACT
  Filled 2022-12-06 (×5): qty 3

## 2022-12-06 MED ORDER — ENOXAPARIN SODIUM 40 MG/0.4ML IJ SOSY
40.0000 mg | PREFILLED_SYRINGE | INTRAMUSCULAR | Status: DC
  Administered 2022-12-06: 40 mg via SUBCUTANEOUS
  Filled 2022-12-06: qty 0.4

## 2022-12-06 MED ORDER — AMLODIPINE BESYLATE 5 MG PO TABS
5.0000 mg | ORAL_TABLET | Freq: Every day | ORAL | Status: DC
  Administered 2022-12-06 – 2022-12-07 (×2): 5 mg via ORAL
  Filled 2022-12-06 (×2): qty 1

## 2022-12-06 MED ORDER — FOLIC ACID 1 MG PO TABS
1.0000 mg | ORAL_TABLET | Freq: Every day | ORAL | Status: DC
  Administered 2022-12-06 – 2022-12-07 (×2): 1 mg via ORAL
  Filled 2022-12-06 (×2): qty 1

## 2022-12-06 MED ORDER — MAGNESIUM SULFATE 2 GM/50ML IV SOLN
2.0000 g | Freq: Once | INTRAVENOUS | Status: AC
  Administered 2022-12-06: 2 g via INTRAVENOUS
  Filled 2022-12-06: qty 50

## 2022-12-06 NOTE — ED Notes (Signed)
ED TO INPATIENT HANDOFF REPORT  ED Nurse Name and Phone #: Huntley Dec [phone]  S Name/Age/Gender Frank [name] 52 y.o. male Room/Bed: WA12/WA12  Code Status   Code Status: Full Code  Home/SNF/Other Patient oriented to: self, place, time, and situation Is this baseline? Yes   Triage Complete: Triage complete  Chief Complaint Acute respiratory failure with hypoxia (HCC) [J96.01]  Triage Note Pt BIB [facility] d/t  has wheezing and rhonchi - has CP - hx of COPD exacerbation .  Pt was given 7.5 of Albuterol and 0.5 Atrovent and Sol u Medrol 125 mg IV.Pt was 79% on RA upon EMS arrival now 100% on Nebs.    Allergies Allergies  Allergen Reactions   Banana    Other Hives and Itching    Allergic to white chocolate Allergic to melons    Level of Care/Admitting Diagnosis ED Disposition     ED Disposition  Admit   Condition  --   Comment  Hospital Area: [facility] [[id number]]  Level of Care: Telemetry [5]  Admit to tele based on following criteria: Other see comments  Comments: Acute respratory failure  May place patient in observation at [facility] or [facility] if equivalent level of care is available:: No  Covid Evaluation: Asymptomatic - no recent exposure (last 10 days) testing not required  Diagnosis: Acute respiratory failure with hypoxia [facility]) [[id number]]  Admitting Physician: Bobette Mo [[id number]]  Attending Physician: Bobette Mo [[id number]]          B Medical/Surgery History Past Medical History:  Diagnosis Date   Arthritis    Asthma    Bronchitis    Gout    Kidney failure    Renal disorder    Rib fracture    History reviewed. No pertinent surgical history.   A IV Location/Drains/Wounds Patient Lines/Drains/Airways Status     Active Line/Drains/Airways     Name Placement date Placement time Site Days   Peripheral IV 12/06/22 20 G Anterior;Left;Proximal Forearm 12/06/22  0437  Forearm  less than 1             Intake/Output Last 24 hours  Intake/Output Summary (Last 24 hours) at 12/06/2022 1518 Last data filed at 12/06/2022 0819 Gross per 24 hour  Intake 50 ml  Output --  Net 50 ml    Labs/Imaging Results for orders placed or performed during the hospital encounter of 12/06/22 (from the past 48 hour(s))  Blood gas, venous     Status: None   Collection Time: 12/06/22  7:30 AM  Result Value Ref Range   pH, Ven 7.34 7.25 - 7.43   pCO2, Ven 51 44 - 60 mmHg   pO2, Ven 40 32 - 45 mmHg   Bicarbonate 27.5 20.0 - 28.0 mmol/L   Acid-Base Excess 0.7 0.0 - 2.0 mmol/L   O2 Saturation 71.1 %   Patient temperature 37.0     Comment: Performed at [facility], [address] [place], [place] [address]  Brain natriuretic peptide     Status: None   Collection Time: 12/06/22  7:30 AM  Result Value Ref Range   B Natriuretic Peptide 22.2 0.0 - 100.0 pg/mL    Comment: Performed at [facility], [address] [place], [place] [address]  CBC with Differential/Platelet     Status: Abnormal   Collection Time: 12/06/22  7:30 AM  Result Value Ref Range   WBC 6.4 4.0 - 10.5 K/uL   RBC 4.32  4.22 - 5.81 MIL/uL   Hemoglobin 14.6 13.0 - 17.0 g/dL   HCT 62.9 52.8 - 41.3 %   MCV 99.1 80.0 - 100.0 fL   MCH 33.8 26.0 - 34.0 pg   MCHC 34.1 30.0 - 36.0 g/dL   RDW 24.4 01.0 - 27.2 %   Platelets 337 150 - 400 K/uL   nRBC 0.0 0.0 - 0.2 %   Neutrophils Relative % 86 %   Neutro Abs 5.5 1.7 - 7.7 K/uL   Lymphocytes Relative 9 %   Lymphs Abs 0.6 (L) 0.7 - 4.0 K/uL   Monocytes Relative 3 %   Monocytes Absolute 0.2 0.1 - 1.0 K/uL   Eosinophils Relative 1 %   Eosinophils Absolute 0.1 0.0 - 0.5 K/uL   Basophils Relative 1 %   Basophils Absolute 0.0 0.0 - 0.1 K/uL   Immature Granulocytes 0 %   Abs Immature Granulocytes 0.02 0.00 - 0.07 K/uL    Comment: Performed at [facility], [address] [place], [place] [address]  Comprehensive metabolic panel      Status: Abnormal   Collection Time: 12/06/22  7:30 AM  Result Value Ref Range   Sodium 138 135 - 145 mmol/L   Potassium 3.6 3.5 - 5.1 mmol/L   Chloride 103 98 - 111 mmol/L   CO2 26 22 - 32 mmol/L   Glucose, Bld 145 (H) 70 - 99 mg/dL    Comment: Glucose reference range applies only to samples taken after fasting for at least 8 hours.   BUN 8 6 - 20 mg/dL   Creatinine, Ser 4.03 0.61 - 1.24 mg/dL   Calcium 9.0 8.9 - 47.4 mg/dL   Total Protein 6.7 6.5 - 8.1 g/dL   Albumin 3.6 3.5 - 5.0 g/dL   AST 32 15 - 41 U/L   ALT 25 0 - 44 U/L   Alkaline Phosphatase 51 38 - 126 U/L   Total Bilirubin 0.6 0.3 - 1.2 mg/dL   GFR, Estimated >25 >95 mL/min    Comment: (NOTE) Calculated using the CKD-EPI Creatinine Equation (2021)    Anion gap 9 5 - 15    Comment: Performed at [facility], [address] [place], [place] [address]  Magnesium     Status: None   Collection Time: 12/06/22  7:30 AM  Result Value Ref Range   Magnesium 1.9 1.7 - 2.4 mg/dL    Comment: Performed at [facility], [address] [place], [place] [address]  Troponin I (High Sensitivity)     Status: None   Collection Time: 12/06/22  7:30 AM  Result Value Ref Range   Troponin I (High Sensitivity) 4 <18 ng/L    Comment: (NOTE) Elevated high sensitivity troponin I (hsTnI) values and significant  changes across serial measurements may suggest ACS but many other  chronic and acute conditions are known to elevate hsTnI results.  Refer to the "Links" section for chest pain algorithms and additional  guidance. Performed at [facility], [address], [place], [place] [address]   Resp panel by RT-PCR (RSV, Flu A&B, Covid) Anterior Nasal Swab     Status: None   Collection Time: 12/06/22  7:30 AM   Specimen: Anterior Nasal Swab  Result Value Ref Range   SARS Coronavirus 2 by RT PCR NEGATIVE NEGATIVE    Comment: (NOTE) SARS-CoV-2 target nucleic acids are NOT  DETECTED.  The SARS-CoV-2 RNA is generally detectable in upper respiratory specimens during the  acute phase of infection. The lowest concentration of SARS-CoV-2 viral copies this assay can detect is 138 copies/mL. A negative result does not preclude SARS-Cov-2 infection and should not be used as the sole basis for treatment or other patient management decisions. A negative result may occur with  improper specimen collection/handling, submission of specimen other than nasopharyngeal swab, presence of viral mutation(s) within the areas targeted by this assay, and inadequate number of viral copies(<138 copies/mL). A negative result must be combined with clinical observations, patient history, and epidemiological information. The expected result is Negative.  Fact Sheet for Patients:  BloggerCourse.com  Fact Sheet for Healthcare Providers:  SeriousBroker.it  This test is no t yet approved or cleared by the [place] FDA and  has been authorized for detection and/or diagnosis of SARS-CoV-2 by FDA under an Emergency Use Authorization (EUA). This EUA will remain  in effect (meaning this test can be used) for the duration of the COVID-19 declaration under Section 564(b)(1) of the Act, 21 U.S.C.section 360bbb-3(b)(1), unless the authorization is terminated  or revoked sooner.       Influenza A by PCR NEGATIVE NEGATIVE   Influenza B by PCR NEGATIVE NEGATIVE    Comment: (NOTE) The Xpert Xpress SARS-CoV-2/FLU/RSV plus assay is intended as an aid in the diagnosis of influenza from Nasopharyngeal swab specimens and should not be used as a sole basis for treatment. Nasal washings and aspirates are unacceptable for Xpert Xpress SARS-CoV-2/FLU/RSV testing.  Fact Sheet for Patients: BloggerCourse.com  Fact Sheet for Healthcare Providers: SeriousBroker.it  This test is not yet approved or  cleared by the [place] FDA and has been authorized for detection and/or diagnosis of SARS-CoV-2 by FDA under an Emergency Use Authorization (EUA). This EUA will remain in effect (meaning this test can be used) for the duration of the COVID-19 declaration under Section 564(b)(1) of the Act, 21 U.S.C. section 360bbb-3(b)(1), unless the authorization is terminated or revoked.     Resp Syncytial Virus by PCR NEGATIVE NEGATIVE    Comment: (NOTE) Fact Sheet for Patients: BloggerCourse.com  Fact Sheet for Healthcare Providers: SeriousBroker.it  This test is not yet approved or cleared by the [place] FDA and has been authorized for detection and/or diagnosis of SARS-CoV-2 by FDA under an Emergency Use Authorization (EUA). This EUA will remain in effect (meaning this test can be used) for the duration of the COVID-19 declaration under Section 564(b)(1) of the Act, 21 U.S.C. section 360bbb-3(b)(1), unless the authorization is terminated or revoked.  Performed at [facility], [address], [place], [place] [address]   Troponin I (High Sensitivity)     Status: None   Collection Time: 12/06/22 11:03 AM  Result Value Ref Range   Troponin I (High Sensitivity) 3 <18 ng/L    Comment: (NOTE) Elevated high sensitivity troponin I (hsTnI) values and significant  changes across serial measurements may suggest ACS but many other  chronic and acute conditions are known to elevate hsTnI results.  Refer to the "Links" section for chest pain algorithms and additional  guidance. Performed at [facility], [address], [place], [place] [address]    DG Chest Port 1 View  Result Date: 12/06/2022 CLINICAL DATA:  Hypoxia EXAM: PORTABLE CHEST 1 VIEW COMPARISON:  11/15/2022 FINDINGS: Artifact from EKG leads. Normal heart size and mediastinal contours. No acute infiltrate or edema. No effusion or  pneumothorax. No acute osseous findings. IMPRESSION: Negative portable chest. Electronically Signed   By: Audry Riles.D.  On: 12/06/2022 07:24    Pending Labs Unresulted Labs (From admission, onward)     Start     Ordered   12/07/22 0500  HIV Antibody (routine testing w rflx)  (HIV Antibody (Routine testing w reflex) panel)  Tomorrow morning,   R        12/06/22 1003   12/07/22 0500  CBC  Tomorrow morning,   R        12/06/22 1003   12/07/22 0500  Comprehensive metabolic panel  Tomorrow morning,   R        12/06/22 1003            Vitals/Pain Today's Vitals   12/06/22 1200 12/06/22 1345 12/06/22 1400 12/06/22 1515  BP: 138/84   130/86  Pulse: 77 98 81 87  Resp: 16   18  Temp:  98.1 F (36.7 C)    TempSrc:  Oral    SpO2: 93% 96% 93% 94%  Weight:      Height:      PainSc:        Isolation Precautions No active isolations  Medications Medications  enoxaparin (LOVENOX) injection 40 mg (has no administration in time range)  predniSONE (DELTASONE) tablet 40 mg (has no administration in time range)  acetaminophen (TYLENOL) tablet 650 mg (has no administration in time range)    Or  acetaminophen (TYLENOL) suppository 650 mg (has no administration in time range)  ondansetron (ZOFRAN) tablet 4 mg (has no administration in time range)    Or  ondansetron (ZOFRAN) injection 4 mg (has no administration in time range)  ipratropium-albuterol (DUONEB) 0.5-2.5 (3) MG/3ML nebulizer solution 3 mL (has no administration in time range)  albuterol (PROVENTIL) (2.5 MG/3ML) 0.083% nebulizer solution 2.5 mg (has no administration in time range)  ipratropium-albuterol (DUONEB) 0.5-2.5 (3) MG/3ML nebulizer solution 3 mL (3 mLs Nebulization Given 12/06/22 0735)  magnesium sulfate IVPB 2 g 50 mL (0 g Intravenous Stopped 12/06/22 0819)    Mobility walks     Focused Assessments  R Recommendations: See Admitting Provider Note  Report given to:   Additional Notes:

## 2022-12-06 NOTE — ED Triage Notes (Signed)
Pt BIB [facility] d/t  has wheezing and rhonchi - has CP - hx of COPD exacerbation .  Pt was given 7.5 of Albuterol and 0.5 Atrovent and Sol u Medrol 125 mg IV.Pt was 79% on RA upon EMS arrival now 100% on Nebs.

## 2022-12-06 NOTE — Plan of Care (Signed)

## 2022-12-06 NOTE — H&P (Signed)
History and Physical    Patient: Frank [name] NWG:[id number] DOB: [birth year] DOA: 12/06/2022 DOS: the patient was seen and examined on 12/06/2022 PCP: Patient, No Pcp Per  Patient coming from: Home  Chief Complaint:  Chief Complaint  Patient presents with   Shortness of Breath   HPI: Frank [name] is a 52 y.o. male with medical history significant of osteoarthritis, asthma, bronchitis, gout, AKI, history of rib fracture who is coming to the emergency department complaints of productive cough, progressively worse dyspnea, wheezing and fatigue.  97.8 F, pulse 88, respiration 20, BP 143/89 mmHg O2 sat 98% on room air.  The patient received an albuterol 7.5 and ipratropium 0.5 mg continuous neb, 125 mg of Solu-Medrol and supplemental oxygen with EMS.  He received a DuoNeb and 2 g of magnesium sulfate in the emergency department.  Lab work: His CBC, troponin, BNP, venous blood gas, coronavirus, influenza and RSV PCR were normal/negative.  Magnesium 1.9 mg/dL.  CMP with a glucose of 145 mg/dL, but otherwise unremarkable.  Imaging: Portable 1 view chest radiograph with normal heart size and mediastinal contours.  No acute infiltrate or edema.   Review of Systems: As mentioned in the history of present illness. All other systems reviewed and are negative. Past Medical History:  Diagnosis Date   Arthritis    Asthma    Bronchitis    Gout    Kidney failure    Renal disorder    Rib fracture    History reviewed. No pertinent surgical history. Social History:  reports that he has been smoking cigarettes. He has never used smokeless tobacco. He reports current alcohol use. He reports current drug use. Drugs: Cocaine, "Crack" cocaine, and Marijuana.  Allergies  Allergen Reactions   Banana    Other Hives and Itching    Allergic to white chocolate Allergic to melons    History reviewed. No pertinent family history.  Prior to Admission medications   Medication Sig Start Date End Date  Taking? Authorizing Provider  albuterol (PROVENTIL) (2.5 MG/3ML) 0.083% nebulizer solution Take 3 mLs (2.5 mg total) by nebulization every 6 (six) hours as needed for wheezing or shortness of breath. 12/09/21   Roemhildt, Lorin T, PA-C  albuterol (VENTOLIN HFA) 108 (90 Base) MCG/ACT inhaler Inhale 2 puffs into the lungs every 6 (six) hours as needed for wheezing or shortness of breath. 07/16/22   Lonell Grandchild, MD  amLODipine (NORVASC) 5 MG tablet Take 1 tablet (5 mg total) by mouth daily. 02/06/21 03/08/21  Dorcas Carrow, MD  amoxicillin-clavulanate (AUGMENTIN) 875-125 MG tablet Take 1 tablet by mouth every 12 (twelve) hours. Patient not taking: Reported on 07/22/2022 12/09/21   Roemhildt, Lorin T, PA-C  gabapentin (NEURONTIN) 300 MG capsule Take 1 capsule (300 mg total) by mouth 3 (three) times daily. 04/14/21 05/14/21  Nira Conn, MD  mometasone-formoterol (DULERA) 200-5 MCG/ACT AERO Inhale 2 puffs into the lungs 2 (two) times daily. Patient not taking: Reported on 07/22/2022 12/09/21   Roemhildt, Lorin T, PA-C  predniSONE (DELTASONE) 50 MG tablet Take 1 tablet (50 mg total) by mouth daily. 11/15/22   Linwood Dibbles, MD    Physical Exam: Vitals:   12/06/22 0512 12/06/22 0513 12/06/22 0700 12/06/22 0800  BP:  (!) 143/89 (!) 132/99 (!) 140/85  Pulse:  88 85 93  Resp:  20  16  Temp:  97.8 F (36.6 C)    TempSrc:  Axillary    SpO2:  98% 91% 92%  Weight: 63 kg  Height: 5\' 8"  (1.727 m)      Physical Exam Vitals and nursing note reviewed.  Constitutional:      General: He is awake. He is not in acute distress.    Appearance: He is well-developed and normal weight.  HENT:     Head: Normocephalic.     Nose: No rhinorrhea.     Mouth/Throat:     Mouth: Mucous membranes are moist.  Eyes:     General: No scleral icterus.    Pupils: Pupils are equal, round, and reactive to light.  Neck:     Vascular: No JVD.  Cardiovascular:     Rate and Rhythm: Normal rate and regular rhythm.      Heart sounds: S1 normal and S2 normal.  Pulmonary:     Breath sounds: Wheezing and rhonchi present.  Abdominal:     General: Bowel sounds are normal.     Palpations: Abdomen is soft.     Tenderness: There is no abdominal tenderness.  Musculoskeletal:     Cervical back: Neck supple.     Right lower leg: No edema.     Left lower leg: No edema.  Skin:    General: Skin is warm and dry.  Neurological:     General: No focal deficit present.     Mental Status: He is alert and oriented to person, place, and time.  Psychiatric:        Mood and Affect: Mood normal.        Behavior: Behavior normal. Behavior is cooperative.    Data Reviewed:  Results are pending, will review when available.  Assessment and Plan: Principal Problem:   Acute respiratory failure with hypoxia (HCC) In the setting of:   COPD with acute exacerbation (HCC) Observation/telemetry Continue supplemental oxygen. Methylprednisolone 125 mg IVP x1 given by EMS. Followed by prednisone 40 mg p.o. daily in a.m. Scheduled and as needed bronchodilators. Follow-up CBC and chemistry in the morning.   Active Problems:   Alcohol abuse Not drinking daily. Monitor for withdrawal symptoms.    Hyperglycemia Check fasting glucose in AM. Check hemoglobin A1c if greater than 99 mg/dL.    Hypertension Currently not taking antihypertensives. Will resume amlodipine 5 mg p.o. daily.     Advance Care Planning:   Code Status: Full Code   Consults:   Family Communication:   Severity of Illness: The appropriate patient status for this patient is OBSERVATION. Observation status is judged to be reasonable and necessary in order to provide the required intensity of service to ensure the patient's safety. The patient's presenting symptoms, physical exam findings, and initial radiographic and laboratory data in the context of their medical condition is felt to place them at decreased risk for further clinical deterioration.  Furthermore, it is anticipated that the patient will be medically stable for discharge from the hospital within 2 midnights of admission.   Author: Bobette Mo, MD 12/06/2022 9:40 AM  For on call review [email or web address].   This document was prepared using Dragon voice recognition software and may contain some unintended transcription errors.

## 2022-12-06 NOTE — ED Provider Notes (Signed)
[place] EMERGENCY DEPARTMENT AT [facility] Provider Note   CSN: [id number] Arrival date & time: 12/06/22  0502     History  Chief Complaint  Patient presents with   Shortness of Breath    Frank [name] is a 52 y.o. male.  52 year old male with past medical history significant for asthma presents today for concern of shortness of breath.  He comes in via EMS.  Was given breathing treatment, Solu-Medrol and route.  Reportedly he was 78% on room air upon EMS arrival.  Currently satting low 90s on room air during my interview.  He had chest pain earlier but currently denies it.  This feels similar to his previous asthma exacerbations.  The history is provided by the patient. No language interpreter was used.       Home Medications Prior to Admission medications   Medication Sig Start Date End Date Taking? Authorizing Provider  albuterol (PROVENTIL) (2.5 MG/3ML) 0.083% nebulizer solution Take 3 mLs (2.5 mg total) by nebulization every 6 (six) hours as needed for wheezing or shortness of breath. 12/09/21   Roemhildt, Lorin T, PA-C  albuterol (VENTOLIN HFA) 108 (90 Base) MCG/ACT inhaler Inhale 2 puffs into the lungs every 6 (six) hours as needed for wheezing or shortness of breath. 07/16/22   Lonell Grandchild, MD  amLODipine (NORVASC) 5 MG tablet Take 1 tablet (5 mg total) by mouth daily. 02/06/21 03/08/21  Dorcas Carrow, MD  amoxicillin-clavulanate (AUGMENTIN) 875-125 MG tablet Take 1 tablet by mouth every 12 (twelve) hours. Patient not taking: Reported on 07/22/2022 12/09/21   Roemhildt, Lorin T, PA-C  gabapentin (NEURONTIN) 300 MG capsule Take 1 capsule (300 mg total) by mouth 3 (three) times daily. 04/14/21 05/14/21  Nira Conn, MD  mometasone-formoterol (DULERA) 200-5 MCG/ACT AERO Inhale 2 puffs into the lungs 2 (two) times daily. Patient not taking: Reported on 07/22/2022 12/09/21   Roemhildt, Lorin T, PA-C  predniSONE (DELTASONE) 50 MG tablet Take 1 tablet  (50 mg total) by mouth daily. 11/15/22   Linwood Dibbles, MD      Allergies    Banana and Other    Review of Systems   Review of Systems  Constitutional:  Negative for chills and fever.  Respiratory:  Positive for cough, shortness of breath and wheezing.   Cardiovascular:  Positive for chest pain. Negative for palpitations and leg swelling.  Gastrointestinal:  Negative for abdominal pain, nausea and vomiting.  Neurological:  Negative for light-headedness.  All other systems reviewed and are negative.   Physical Exam Updated Vital Signs BP (!) 132/99   Pulse 85   Temp 97.8 F (36.6 C) (Axillary)   Resp 20   Ht 5\' 8"  (1.727 m)   Wt 63 kg   SpO2 91%   BMI 21.13 kg/m  Physical Exam Vitals and nursing note reviewed.  Constitutional:      General: He is not in acute distress.    Appearance: Normal appearance. He is not ill-appearing.  HENT:     Head: Normocephalic and atraumatic.     Nose: Nose normal.  Eyes:     General: No scleral icterus.    Extraocular Movements: Extraocular movements intact.     Conjunctiva/sclera: Conjunctivae normal.  Cardiovascular:     Rate and Rhythm: Normal rate and regular rhythm.     Heart sounds: Normal heart sounds.  Pulmonary:     Effort: Pulmonary effort is normal. No respiratory distress.     Breath sounds: Wheezing present.  Comments: Coarse lung sounds throughout Abdominal:     General: There is no distension.     Tenderness: There is no abdominal tenderness.  Musculoskeletal:        General: Normal range of motion.     Cervical back: Normal range of motion.  Skin:    General: Skin is warm and dry.  Neurological:     General: No focal deficit present.     Mental Status: He is alert and oriented to person, place, and time. Mental status is at baseline.     ED Results / Procedures / Treatments   Labs (all labs ordered are listed, but only abnormal results are displayed) Labs Reviewed  CBC WITH DIFFERENTIAL/PLATELET - Abnormal;  Notable for the following components:      Result Value   Lymphs Abs 0.6 (*)    All other components within normal limits  RESP PANEL BY RT-PCR (RSV, FLU A&B, COVID)  RVPGX2  BLOOD GAS, VENOUS  BRAIN NATRIURETIC PEPTIDE  COMPREHENSIVE METABOLIC PANEL  MAGNESIUM  TROPONIN I (HIGH SENSITIVITY)    EKG EKG Interpretation Date/Time:  Tuesday December 06 2022 05:17:04 EDT Ventricular Rate:  85 PR Interval:  133 QRS Duration:  91 QT Interval:  368 QTC Calculation: 438 R Axis:   76  Text Interpretation: Sinus rhythm Probable left atrial enlargement When compared with ECG of 11/15/2022, No significant change was found Confirmed by Dione Booze ([id number]) on 12/06/2022 6:24:54 AM  Radiology DG Chest Port 1 View  Result Date: 12/06/2022 CLINICAL DATA:  Hypoxia EXAM: PORTABLE CHEST 1 VIEW COMPARISON:  11/15/2022 FINDINGS: Artifact from EKG leads. Normal heart size and mediastinal contours. No acute infiltrate or edema. No effusion or pneumothorax. No acute osseous findings. IMPRESSION: Negative portable chest. Electronically Signed   By: Tiburcio Pea M.D.   On: 12/06/2022 07:24    Procedures .Critical Care  Performed by: Marita Kansas, PA-C Authorized by: Marita Kansas, PA-C   Critical care provider statement:    Critical care time (minutes):  33   Critical care was necessary to treat or prevent imminent or life-threatening deterioration of the following conditions:  Respiratory failure   Critical care was time spent personally by me on the following activities:  Development of treatment plan with patient or surrogate, discussions with consultants, evaluation of patient's response to treatment, examination of patient, ordering and review of laboratory studies, ordering and review of radiographic studies, ordering and performing treatments and interventions, pulse oximetry, re-evaluation of patient's condition and review of old charts     Medications Ordered in ED Medications  magnesium sulfate  IVPB 2 g 50 mL (2 g Intravenous New Bag/Given 12/06/22 0734)  ipratropium-albuterol (DUONEB) 0.5-2.5 (3) MG/3ML nebulizer solution 3 mL (3 mLs Nebulization Given 12/06/22 6045)    ED Course/ Medical Decision Making/ A&P                                 Medical Decision Making Amount and/or Complexity of Data Reviewed Labs: ordered. Radiology: ordered.  Risk Prescription drug management. Decision regarding hospitalization.   Medical Decision Making / ED Course   This patient presents to the ED for concern of shortness of breath, this involves an extensive number of treatment options, and is a complaint that carries with it a high risk of complications and morbidity.  The differential diagnosis includes asthma exacerbation, pneumonia, PE, ACS  MDM: 52 year old male with past medical history significant for asthma  presents today for concern of shortness of breath.  He states this feels similar to his asthma exacerbation.  Reportedly he was hypoxic on scene.  Satting low 90s on room air.  However with exertion he does drop to as low as 85%.  Will provide additional DuoNeb, magnesium and reevaluate.  He was given Solu-Medrol, DuoNeb en route.  CBC is unremarkable, CMP with glucose of 145 otherwise without acute concerns.  VBG without acute concerns.  Magnesium 1.9.  Respiratory panel negative.  BNP of 22 which is within normal.  Troponin negative.  Low suspicion for ACS.  EKG without acute ischemic changes.  Following magnesium, an additional round of DuoNeb patient had an episode of hypoxia to 87% while at rest.  Patient placed on 2 L [place] supplemental O2.  Will discuss with hospitalist for admission.  Will discuss the use of antibiotics with the hospitalist before ordering given no strong evidence of infection and his reported history of COPD which is not documented.  Discussed with hospitalist who agrees to defer antibiotics until his evaluation.  He will evaluate patient for admission.  Lab  Tests: -I ordered, reviewed, and interpreted labs.   The pertinent results include:   Labs Reviewed  CBC WITH DIFFERENTIAL/PLATELET - Abnormal; Notable for the following components:      Result Value   Lymphs Abs 0.6 (*)    All other components within normal limits  RESP PANEL BY RT-PCR (RSV, FLU A&B, COVID)  RVPGX2  BLOOD GAS, VENOUS  BRAIN NATRIURETIC PEPTIDE  COMPREHENSIVE METABOLIC PANEL  MAGNESIUM  TROPONIN I (HIGH SENSITIVITY)      EKG  EKG Interpretation Date/Time:  Tuesday December 06 2022 05:17:04 EDT Ventricular Rate:  85 PR Interval:  133 QRS Duration:  91 QT Interval:  368 QTC Calculation: 438 R Axis:   76  Text Interpretation: Sinus rhythm Probable left atrial enlargement When compared with ECG of 11/15/2022, No significant change was found Confirmed by Dione Booze ([id number]) on 12/06/2022 6:24:54 AM         Imaging Studies ordered: I ordered imaging studies including chest x-ray I independently visualized and interpreted imaging. I agree with the radiologist interpretation   Medicines ordered and prescription drug management: Meds ordered this encounter  Medications   ipratropium-albuterol (DUONEB) 0.5-2.5 (3) MG/3ML nebulizer solution 3 mL   magnesium sulfate IVPB 2 g 50 mL    -I have reviewed the patients home medicines and have made adjustments as needed  Critical interventions DuoNeb   Reevaluation: After the interventions noted above, I reevaluated the patient and found that they have :stayed the same  Co morbidities that complicate the patient evaluation  Past Medical History:  Diagnosis Date   Arthritis    Asthma    Bronchitis    Gout    Kidney failure    Renal disorder    Rib fracture       Dispostion: Patient discussed with hospitalist will evaluate patient for admission.  Final Clinical Impression(s) / ED Diagnoses Final diagnoses:  Acute hypoxic respiratory failure (HCC)  Exacerbation of asthma, unspecified asthma severity,  unspecified whether persistent    Rx / DC Orders ED Discharge Orders     None         Marita Kansas, PA-C 12/06/22 1031    Rondel Baton, MD 12/06/22 [phone]

## 2022-12-07 ENCOUNTER — Observation Stay (HOSPITAL_COMMUNITY): Payer: Commercial Managed Care - HMO

## 2022-12-07 ENCOUNTER — Encounter (HOSPITAL_COMMUNITY): Payer: Self-pay

## 2022-12-07 DIAGNOSIS — J9621 Acute and chronic respiratory failure with hypoxia: Secondary | ICD-10-CM | POA: Diagnosis not present

## 2022-12-07 DIAGNOSIS — J9601 Acute respiratory failure with hypoxia: Secondary | ICD-10-CM | POA: Diagnosis not present

## 2022-12-07 LAB — LACTIC ACID, PLASMA: Lactic Acid, Venous: 1.9 mmol/L (ref 0.5–1.9)

## 2022-12-07 MED ORDER — PREDNISONE 20 MG PO TABS: 40.0000 mg | ORAL_TABLET | Freq: Every day | ORAL | 0 refills | Status: AC

## 2022-12-07 MED ORDER — HYDROCODONE-ACETAMINOPHEN 5-325 MG PO TABS
1.0000 | ORAL_TABLET | ORAL | Status: DC | PRN
  Administered 2022-12-07: 2 via ORAL
  Filled 2022-12-07: qty 2

## 2022-12-07 MED ORDER — LOSARTAN POTASSIUM 50 MG PO TABS
50.0000 mg | ORAL_TABLET | Freq: Every day | ORAL | Status: DC
  Administered 2022-12-07: 50 mg via ORAL
  Filled 2022-12-07: qty 1

## 2022-12-07 MED ORDER — HYDROCODONE-ACETAMINOPHEN 5-325 MG PO TABS: 1.0000 | ORAL_TABLET | Freq: Four times a day (QID) | ORAL | 0 refills | Status: AC | PRN

## 2022-12-07 MED ORDER — HYDROMORPHONE HCL 1 MG/ML IJ SOLN: 0.5000 mg | INTRAMUSCULAR | Status: DC | PRN

## 2022-12-07 MED ORDER — IOHEXOL 300 MG/ML  SOLN
100.0000 mL | Freq: Once | INTRAMUSCULAR | Status: AC | PRN
  Administered 2022-12-07: 100 mL via INTRAVENOUS

## 2022-12-07 MED ORDER — FENTANYL CITRATE PF 50 MCG/ML IJ SOSY
25.0000 ug | PREFILLED_SYRINGE | Freq: Once | INTRAMUSCULAR | Status: AC
  Administered 2022-12-07: 25 ug via INTRAVENOUS
  Filled 2022-12-07: qty 1

## 2022-12-07 MED ORDER — ALBUTEROL SULFATE HFA 108 (90 BASE) MCG/ACT IN AERS: INHALATION_SPRAY | RESPIRATORY_TRACT | 2 refills | Status: AC

## 2022-12-07 MED ORDER — IOHEXOL 9 MG/ML PO SOLN
500.0000 mL | ORAL | Status: AC
  Administered 2022-12-07 (×2): 500 mL via ORAL

## 2022-12-07 MED ORDER — HYDROMORPHONE HCL 1 MG/ML IJ SOLN
0.5000 mg | Freq: Once | INTRAMUSCULAR | Status: AC | PRN
  Administered 2022-12-07: 0.5 mg via INTRAVENOUS
  Filled 2022-12-07: qty 0.5

## 2022-12-07 MED ORDER — AMLODIPINE BESYLATE 10 MG PO TABS: 10.0000 mg | ORAL_TABLET | Freq: Every day | ORAL | 0 refills | Status: AC

## 2022-12-07 MED ORDER — SODIUM CHLORIDE (PF) 0.9 % IJ SOLN
INTRAMUSCULAR | Status: AC
  Filled 2022-12-07: qty 50

## 2022-12-07 MED ORDER — IOHEXOL 9 MG/ML PO SOLN
ORAL | Status: AC
  Filled 2022-12-07: qty 1000

## 2022-12-07 NOTE — Discharge Summary (Signed)
Physician Discharge Summary   Patient: Frank [name] MRN: [id number] DOB: 28-Aug-1970  Admit date:     12/06/2022  Discharge date: {dischdate:26783}  Discharge Physician: Jacquelin Hawking   PCP: Patient, No Pcp Per   Recommendations at discharge:  {Tip this will not be part of the note when signed- Example include specific recommendations for outpatient follow-up, pending tests to follow-up on. (Optional):26781}  ***  Discharge Diagnoses: Principal Problem:   Acute respiratory failure with hypoxia (HCC) Active Problems:   Alcohol abuse   Hyperglycemia   COPD with acute exacerbation (HCC)   Hypertension  Resolved Problems:   * No resolved hospital problems. [facility] Course: No notes on file  Assessment and Plan: No notes have been filed under this hospital service. Service: Hospitalist     {Tip this will not be part of the note when signed Body mass index is 21.13 kg/m. , ,  (Optional):26781}  {(NOTE) Pain control PDMP Statment (Optional):26782} Consultants: *** Procedures performed: ***  Disposition: {Plan; Disposition:26390} Diet recommendation:  {Diet_Plan:26776} DISCHARGE MEDICATION: Allergies as of 12/07/2022       Reactions   Banana    Other Hives, Itching   Allergic to white chocolate Allergic to melons        Medication List     TAKE these medications    albuterol 108 (90 Base) MCG/ACT inhaler Commonly known as: VENTOLIN HFA Inhale 2 puffs into the lungs every 4 (four) hours while awake for 2 days, THEN 2 puffs every 4 (four) hours as needed for wheezing or shortness of breath. Start taking on: December 07, 2022 What changed: See the new instructions.   amLODipine 10 MG tablet Commonly known as: NORVASC Take 1 tablet (10 mg total) by mouth daily. What changed:  medication strength how much to take   HYDROcodone-acetaminophen 5-325 MG tablet Commonly known as: NORCO/VICODIN Take 1 tablet by mouth every 6 (six) hours as needed for up to 3  days for moderate pain or severe pain.   predniSONE 20 MG tablet Commonly known as: DELTASONE Take 2 tablets (40 mg total) by mouth daily with breakfast for 2 days. Start taking on: December 08, 2022 What changed:  medication strength how much to take when to take this        Follow-up Information     [facility] Call.   Why: A referral has been made on your behalf for assistance with applying for disability income Contact information: [address] [place], [place] [address]  Telephone: [phone] Fax: [phone]               Discharge Exam: Filed Weights   12/06/22 0512  Weight: 63 kg   ***  Condition at discharge: {DC Condition:26389}  The results of significant diagnostics from this hospitalization (including imaging, microbiology, ancillary and laboratory) are listed below for reference.   Imaging Studies: CT ABDOMEN PELVIS W CONTRAST  Result Date: 12/07/2022 CLINICAL DATA:  Acute abdominal pain EXAM: CT ABDOMEN AND PELVIS WITH CONTRAST TECHNIQUE: Multidetector CT imaging of the abdomen and pelvis was performed using the standard protocol following bolus administration of intravenous contrast. RADIATION DOSE REDUCTION: This exam was performed according to the departmental dose-optimization program which includes automated exposure control, adjustment of the mA and/or kV according to patient size and/or use of iterative reconstruction technique. CONTRAST:  OMNIPAQUE IOHEXOL 300 MG/ML  SOLN COMPARISON:  Radiographs from 12/07/2022 and CT 11/14/2021 FINDINGS: Lower chest: Unremarkable Hepatobiliary: Unremarkable Pancreas: Unremarkable Spleen: Unremarkable Adrenals/Urinary Tract: Unremarkable  Stomach/Bowel: Striking paucity of adipose tissue causes clumping of bowel. Normal appendix. Orally administered contrast extends through to the splenic flexure. No dilated bowel. Vascular/Lymphatic: Atherosclerosis is present, including aortoiliac atherosclerotic  disease. No pathologic adenopathy observed. Reproductive: Unremarkable Other: Small amount of free pelvic fluid, significance uncertain. Musculoskeletal: Mild lower thoracic spondylosis. Small Schmorl's nodes at the L5-S1 level. IMPRESSION: 1. Small amount of free pelvic fluid, significance uncertain. 2. Aortic atherosclerosis. Aortic Atherosclerosis (ICD10-I70.0). Electronically Signed   By: Gaylyn Rong M.D.   On: 12/07/2022 15:46   DG ABD ACUTE 2+V W 1V CHEST  Result Date: 12/07/2022 CLINICAL DATA:  [id number] Abdominal tenderness [id number] Abdominal pain 644753 242437 Abdominal guarding [id number] EXAM: DG ABDOMEN ACUTE WITH 1 VIEW CHEST COMPARISON:  the previous day's study FINDINGS: Lungs clear. Heart size normal.  No pleural effusion. No free air. Stomach and small bowel relatively decompressed. Moderate proximal colonic fecal material, decompressed distally. Pelvic phleboliths. Regional bones unremarkable. IMPRESSION: 1. No acute findings. 2. Moderate proximal colonic fecal material. Electronically Signed   By: Corlis Leak M.D.   On: 12/07/2022 14:30   DG Chest Port 1 View  Result Date: 12/06/2022 CLINICAL DATA:  Hypoxia EXAM: PORTABLE CHEST 1 VIEW COMPARISON:  11/15/2022 FINDINGS: Artifact from EKG leads. Normal heart size and mediastinal contours. No acute infiltrate or edema. No effusion or pneumothorax. No acute osseous findings. IMPRESSION: Negative portable chest. Electronically Signed   By: Tiburcio Pea M.D.   On: 12/06/2022 07:24   DG Chest 2 View  Result Date: 11/24/2022 CLINICAL DATA:  Cough. EXAM: CHEST - 2 VIEW COMPARISON:  None Available. FINDINGS: The heart size and mediastinal contours are within normal limits. Both lungs are clear. The visualized skeletal structures are unremarkable. IMPRESSION: No active cardiopulmonary disease. Electronically Signed   By: Lupita Raider M.D.   On: 11/24/2022 08:04   DG Chest 2 View  Result Date: 11/15/2022 CLINICAL DATA:  dyspnea EXAM:  CHEST - 2 VIEW COMPARISON:  07/21/2022 FINDINGS: Lungs are clear. Heart size and mediastinal contours are within normal limits. No effusion. Vertebral endplate spurring at multiple levels in the lower thoracic spine. IMPRESSION: No acute cardiopulmonary disease. Electronically Signed   By: Corlis Leak M.D.   On: 11/15/2022 12:33    Microbiology: Results for orders placed or performed during the hospital encounter of 12/06/22  Resp panel by RT-PCR (RSV, Flu A&B, Covid) Anterior Nasal Swab     Status: None   Collection Time: 12/06/22  7:30 AM   Specimen: Anterior Nasal Swab  Result Value Ref Range Status   SARS Coronavirus 2 by RT PCR NEGATIVE NEGATIVE Final    Comment: (NOTE) SARS-CoV-2 target nucleic acids are NOT DETECTED.  The SARS-CoV-2 RNA is generally detectable in upper respiratory specimens during the acute phase of infection. The lowest concentration of SARS-CoV-2 viral copies this assay can detect is 138 copies/mL. A negative result does not preclude SARS-Cov-2 infection and should not be used as the sole basis for treatment or other patient management decisions. A negative result may occur with  improper specimen collection/handling, submission of specimen other than nasopharyngeal swab, presence of viral mutation(s) within the areas targeted by this assay, and inadequate number of viral copies(<138 copies/mL). A negative result must be combined with clinical observations, patient history, and epidemiological information. The expected result is Negative.  Fact Sheet for Patients:  BloggerCourse.com  Fact Sheet for Healthcare Providers:  SeriousBroker.it  This test is no t yet approved or cleared by the [place]  FDA and  has been authorized for detection and/or diagnosis of SARS-CoV-2 by FDA under an Emergency Use Authorization (EUA). This EUA will remain  in effect (meaning this test can be used) for the duration of  the COVID-19 declaration under Section 564(b)(1) of the Act, 21 U.S.C.section 360bbb-3(b)(1), unless the authorization is terminated  or revoked sooner.       Influenza A by PCR NEGATIVE NEGATIVE Final   Influenza B by PCR NEGATIVE NEGATIVE Final    Comment: (NOTE) The Xpert Xpress SARS-CoV-2/FLU/RSV plus assay is intended as an aid in the diagnosis of influenza from Nasopharyngeal swab specimens and should not be used as a sole basis for treatment. Nasal washings and aspirates are unacceptable for Xpert Xpress SARS-CoV-2/FLU/RSV testing.  Fact Sheet for Patients: BloggerCourse.com  Fact Sheet for Healthcare Providers: SeriousBroker.it  This test is not yet approved or cleared by the [place] FDA and has been authorized for detection and/or diagnosis of SARS-CoV-2 by FDA under an Emergency Use Authorization (EUA). This EUA will remain in effect (meaning this test can be used) for the duration of the COVID-19 declaration under Section 564(b)(1) of the Act, 21 U.S.C. section 360bbb-3(b)(1), unless the authorization is terminated or revoked.     Resp Syncytial Virus by PCR NEGATIVE NEGATIVE Final    Comment: (NOTE) Fact Sheet for Patients: BloggerCourse.com  Fact Sheet for Healthcare Providers: SeriousBroker.it  This test is not yet approved or cleared by the [place] FDA and has been authorized for detection and/or diagnosis of SARS-CoV-2 by FDA under an Emergency Use Authorization (EUA). This EUA will remain in effect (meaning this test can be used) for the duration of the COVID-19 declaration under Section 564(b)(1) of the Act, 21 U.S.C. section 360bbb-3(b)(1), unless the authorization is terminated or revoked.  Performed at [facility], [address] [place], [place] [address]     Labs: CBC: Recent Labs  Lab 12/06/22 0730  12/07/22 0508  WBC 6.4 14.6*  NEUTROABS 5.5  --   HGB 14.6 15.0  HCT 42.8 43.7  MCV 99.1 99.3  PLT 337 384   Basic Metabolic Panel: Recent Labs  Lab 12/06/22 0730 12/07/22 0508  NA 138 137  K 3.6 3.8  CL 103 101  CO2 26 25  GLUCOSE 145* 116*  BUN 8 13  CREATININE 0.94 0.93  CALCIUM 9.0 9.6  MG 1.9  --    Liver Function Tests: Recent Labs  Lab 12/06/22 0730 12/07/22 0508  AST 32 23  ALT 25 23  ALKPHOS 51 57  BILITOT 0.6 0.2*  PROT 6.7 7.0  ALBUMIN 3.6 3.7   CBG: No results for input(s): "GLUCAP" in the last 168 hours.  Discharge time spent: {LESS THAN/GREATER GMWN:02725} 30 minutes.  Signed: Jacquelin Hawking, MD [facility] 12/07/2022

## 2022-12-07 NOTE — Plan of Care (Signed)
  Problem: Education: Goal: Knowledge of disease or condition will improve Outcome: Progressing Goal: Knowledge of the prescribed therapeutic regimen will improve Outcome: Progressing   Problem: Activity: Goal: Ability to tolerate increased activity will improve Outcome: Progressing Goal: Will verbalize the importance of balancing activity with adequate rest periods Outcome: Progressing   
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
## 2022-12-07 NOTE — Discharge Instructions (Signed)
Frank [name],  You are in the hospital because of an asthma exacerbation.  This was mild and improved quickly.  He also has some abdominal pain which is unclear why happened but has improved.  Your scans looked good.  You will need to follow-up with your primary care physician to continue management of your medical issues in addition to getting good screening testing and continued medical follow-up.  I have refilled your medication for blood pressure and asthma.

## 2022-12-07 NOTE — Progress Notes (Incomplete)
PROGRESS NOTE    Frank [name]  NFA:[id number] DOB: 06-02-1970 DOA: 12/06/2022 PCP: Patient, No Pcp Per   Brief Narrative: No notes on file   Assessment and Plan: No notes have been filed under this hospital service. Service: Hospitalist          DVT prophylaxis: *** Code Status:   Code Status: Full Code Family Communication: *** Disposition Plan: ***   Consultants:  ***  Procedures:  ***  Antimicrobials: ***    Subjective: ***  Objective: BP 130/89 (BP Location: Right Arm)   Pulse 80   Temp 98 F (36.7 C)   Resp 18   Ht 5\' 8"  (1.727 m)   Wt 63 kg   SpO2 99%   BMI 21.13 kg/m   Examination:  General exam: Appears calm and comfortable *** Respiratory system: Clear to auscultation. Respiratory effort normal. Cardiovascular system: S1 & S2 heard, RRR. No murmurs, rubs, gallops or clicks. Gastrointestinal system: Abdomen is nondistended, soft and nontender. No organomegaly or masses felt. Normal bowel sounds heard. Central nervous system: Alert and oriented. No focal neurological deficits. Musculoskeletal: No edema. No calf tenderness Skin: No cyanosis. No rashes Psychiatry: Judgement and insight appear normal. Mood & affect appropriate.    Data Reviewed: I have personally reviewed following labs and imaging studies  CBC Lab Results  Component Value Date   WBC 14.6 (H) 12/07/2022   RBC 4.40 12/07/2022   HGB 15.0 12/07/2022   HCT 43.7 12/07/2022   MCV 99.3 12/07/2022   MCH 34.1 (H) 12/07/2022   PLT 384 12/07/2022   MCHC 34.3 12/07/2022   RDW 13.9 12/07/2022   LYMPHSABS 0.6 (L) 12/06/2022   MONOABS 0.2 12/06/2022   EOSABS 0.1 12/06/2022   BASOSABS 0.0 12/06/2022     Last metabolic panel Lab Results  Component Value Date   NA 137 12/07/2022   K 3.8 12/07/2022   CL 101 12/07/2022   CO2 25 12/07/2022   BUN 13 12/07/2022   CREATININE 0.93 12/07/2022   GLUCOSE 116 (H) 12/07/2022   GFRNONAA >60 12/07/2022   GFRAA >90 07/28/2013    CALCIUM 9.6 12/07/2022   PHOS 5.2 (H) 02/06/2021   PROT 7.0 12/07/2022   ALBUMIN 3.7 12/07/2022   BILITOT 0.2 (L) 12/07/2022   ALKPHOS 57 12/07/2022   AST 23 12/07/2022   ALT 23 12/07/2022   ANIONGAP 11 12/07/2022    GFR: Estimated Creatinine Clearance: 81 mL/min (by C-G formula based on SCr of 0.93 mg/dL).  Recent Results (from the past 240 hour(s))  Resp panel by RT-PCR (RSV, Flu A&B, Covid) Anterior Nasal Swab     Status: None   Collection Time: 12/06/22  7:30 AM   Specimen: Anterior Nasal Swab  Result Value Ref Range Status   SARS Coronavirus 2 by RT PCR NEGATIVE NEGATIVE Final    Comment: (NOTE) SARS-CoV-2 target nucleic acids are NOT DETECTED.  The SARS-CoV-2 RNA is generally detectable in upper respiratory specimens during the acute phase of infection. The lowest concentration of SARS-CoV-2 viral copies this assay can detect is 138 copies/mL. A negative result does not preclude SARS-Cov-2 infection and should not be used as the sole basis for treatment or other patient management decisions. A negative result may occur with  improper specimen collection/handling, submission of specimen other than nasopharyngeal swab, presence of viral mutation(s) within the areas targeted by this assay, and inadequate number of viral copies(<138 copies/mL). A negative result must be combined with clinical observations, patient history, and epidemiological information. The  expected result is Negative.  Fact Sheet for Patients:  BloggerCourse.com  Fact Sheet for Healthcare Providers:  SeriousBroker.it  This test is no t yet approved or cleared by the [place] FDA and  has been authorized for detection and/or diagnosis of SARS-CoV-2 by FDA under an Emergency Use Authorization (EUA). This EUA will remain  in effect (meaning this test can be used) for the duration of the COVID-19 declaration under Section 564(b)(1) of the Act,  21 U.S.C.section 360bbb-3(b)(1), unless the authorization is terminated  or revoked sooner.       Influenza A by PCR NEGATIVE NEGATIVE Final   Influenza B by PCR NEGATIVE NEGATIVE Final    Comment: (NOTE) The Xpert Xpress SARS-CoV-2/FLU/RSV plus assay is intended as an aid in the diagnosis of influenza from Nasopharyngeal swab specimens and should not be used as a sole basis for treatment. Nasal washings and aspirates are unacceptable for Xpert Xpress SARS-CoV-2/FLU/RSV testing.  Fact Sheet for Patients: BloggerCourse.com  Fact Sheet for Healthcare Providers: SeriousBroker.it  This test is not yet approved or cleared by the [place] FDA and has been authorized for detection and/or diagnosis of SARS-CoV-2 by FDA under an Emergency Use Authorization (EUA). This EUA will remain in effect (meaning this test can be used) for the duration of the COVID-19 declaration under Section 564(b)(1) of the Act, 21 U.S.C. section 360bbb-3(b)(1), unless the authorization is terminated or revoked.     Resp Syncytial Virus by PCR NEGATIVE NEGATIVE Final    Comment: (NOTE) Fact Sheet for Patients: BloggerCourse.com  Fact Sheet for Healthcare Providers: SeriousBroker.it  This test is not yet approved or cleared by the [place] FDA and has been authorized for detection and/or diagnosis of SARS-CoV-2 by FDA under an Emergency Use Authorization (EUA). This EUA will remain in effect (meaning this test can be used) for the duration of the COVID-19 declaration under Section 564(b)(1) of the Act, 21 U.S.C. section 360bbb-3(b)(1), unless the authorization is terminated or revoked.  Performed at [facility], [address], [place], [place] [address]       Radiology Studies: DG ABD ACUTE 2+V W 1V CHEST  Result Date: 12/07/2022 CLINICAL DATA:  [id number] Abdominal  tenderness [id number] Abdominal pain [id number] [id number] Abdominal guarding [id number] EXAM: DG ABDOMEN ACUTE WITH 1 VIEW CHEST COMPARISON:  the previous day's study FINDINGS: Lungs clear. Heart size normal.  No pleural effusion. No free air. Stomach and small bowel relatively decompressed. Moderate proximal colonic fecal material, decompressed distally. Pelvic phleboliths. Regional bones unremarkable. IMPRESSION: 1. No acute findings. 2. Moderate proximal colonic fecal material. Electronically Signed   By: Corlis Leak M.D.   On: 12/07/2022 14:30   DG Chest Port 1 View  Result Date: 12/06/2022 CLINICAL DATA:  Hypoxia EXAM: PORTABLE CHEST 1 VIEW COMPARISON:  11/15/2022 FINDINGS: Artifact from EKG leads. Normal heart size and mediastinal contours. No acute infiltrate or edema. No effusion or pneumothorax. No acute osseous findings. IMPRESSION: Negative portable chest. Electronically Signed   By: Tiburcio Pea M.D.   On: 12/06/2022 07:24      LOS: 0 days    Jacquelin Hawking, MD [facility] 12/07/2022, 3:04 PM   If 7PM-7AM, please contact night-coverage [email or web address]

## 2022-12-08 DIAGNOSIS — J45901 Unspecified asthma with (acute) exacerbation: Secondary | ICD-10-CM | POA: Insufficient documentation

## 2022-12-08 DIAGNOSIS — R109 Unspecified abdominal pain: Secondary | ICD-10-CM

## 2022-12-08 NOTE — Hospital Course (Signed)
Frank [name] is a 52 y.o. male with a history of osteoarthritis, asthma, bronchitis, gout.  Patient presented secondary to shortness of breath with productive cough, fatigue and wheezing.  Patient was found to have evidence of acute asthma.  Patient was treated initially with Solu-Medrol in addition to continuous nebs, supplemental oxygen, and magnesium sulfate.  Patient with quick improvement of symptoms and was able to discharge the next day.

## 2022-12-13 ENCOUNTER — Emergency Department (HOSPITAL_COMMUNITY)
Admission: EM | Admit: 2022-12-13 | Discharge: 2022-12-13 | Disposition: A | Discharge status: Home / Self Care | Payer: Commercial Managed Care - HMO | Attending: Emergency Medicine | Admitting: Emergency Medicine

## 2022-12-13 ENCOUNTER — Encounter (HOSPITAL_COMMUNITY): Payer: Self-pay

## 2022-12-13 ENCOUNTER — Other Ambulatory Visit: Payer: Self-pay

## 2022-12-13 ENCOUNTER — Emergency Department (HOSPITAL_COMMUNITY): Payer: Commercial Managed Care - HMO

## 2022-12-13 DIAGNOSIS — R062 Wheezing: Secondary | ICD-10-CM | POA: Diagnosis present

## 2022-12-13 DIAGNOSIS — Z5900 Homelessness unspecified: Secondary | ICD-10-CM | POA: Diagnosis not present

## 2022-12-13 DIAGNOSIS — J45901 Unspecified asthma with (acute) exacerbation: Secondary | ICD-10-CM | POA: Diagnosis not present

## 2022-12-13 LAB — CBC WITH DIFFERENTIAL/PLATELET
Abs Immature Granulocytes: 0.01 10*3/uL (ref 0.00–0.07)
Basophils Absolute: 0 10*3/uL (ref 0.0–0.1)
Basophils Relative: 0 %
Eosinophils Absolute: 0.1 10*3/uL (ref 0.0–0.5)
Eosinophils Relative: 2 %
HCT: 40.7 % (ref 39.0–52.0)
Hemoglobin: 14 g/dL (ref 13.0–17.0)
Immature Granulocytes: 0 %
Lymphocytes Relative: 11 %
Lymphs Abs: 0.7 10*3/uL (ref 0.7–4.0)
MCH: 33.3 pg (ref 26.0–34.0)
MCHC: 34.4 g/dL (ref 30.0–36.0)
MCV: 96.9 fL (ref 80.0–100.0)
Monocytes Absolute: 0.3 10*3/uL (ref 0.1–1.0)
Monocytes Relative: 4 %
Neutro Abs: 5.3 10*3/uL (ref 1.7–7.7)
Neutrophils Relative %: 83 %
Platelets: 337 10*3/uL (ref 150–400)
RBC: 4.2 MIL/uL — ABNORMAL LOW (ref 4.22–5.81)
RDW: 13.6 % (ref 11.5–15.5)
WBC: 6.4 10*3/uL (ref 4.0–10.5)
nRBC: 0 % (ref 0.0–0.2)

## 2022-12-13 LAB — BASIC METABOLIC PANEL
Anion gap: 8 (ref 5–15)
BUN: 11 mg/dL (ref 6–20)
CO2: 25 mmol/L (ref 22–32)
Calcium: 8.6 mg/dL — ABNORMAL LOW (ref 8.9–10.3)
Chloride: 102 mmol/L (ref 98–111)
Creatinine, Ser: 0.93 mg/dL (ref 0.61–1.24)
GFR, Estimated: >60 mL/min (ref >60)
Glucose, Bld: 92 mg/dL (ref 70–99)
Potassium: 3.8 mmol/L (ref 3.5–5.1)
Sodium: 135 mmol/L (ref 135–145)

## 2022-12-13 LAB — BRAIN NATRIURETIC PEPTIDE: B Natriuretic Peptide: 32.4 pg/mL (ref 0.0–100.0)

## 2022-12-13 MED ORDER — METHYLPREDNISOLONE SODIUM SUCC 125 MG IJ SOLR
125.0000 mg | Freq: Once | INTRAMUSCULAR | Status: AC
  Administered 2022-12-13: 125 mg via INTRAVENOUS
  Filled 2022-12-13: qty 2

## 2022-12-13 MED ORDER — ALBUTEROL SULFATE HFA 108 (90 BASE) MCG/ACT IN AERS
2.0000 | INHALATION_SPRAY | Freq: Once | RESPIRATORY_TRACT | Status: AC
  Administered 2022-12-13: 2 via RESPIRATORY_TRACT
  Filled 2022-12-13: qty 6.7

## 2022-12-13 NOTE — ED Triage Notes (Signed)
The pt was bib EMS. The pt has a PMHx of asthma. The pt called EMS and when they arrived the pt ran straight and got into the ambulance. He was wheezing and skin dry on observation. The pt is homeless. The pt has had 10mL of albuterol and 0.7mL of Atrovent. VS B/P 162/94, P 100, O2 Sats 96% RA, T 97.7.

## 2022-12-13 NOTE — ED Provider Notes (Signed)
[place] EMERGENCY DEPARTMENT AT [facility] Provider Note   CSN: [id number] Arrival date & time: 12/13/22  4098     History  No chief complaint on file.   Frank [name] is a 52 y.o. male with a PMHx of asthma, bronchitis who presents to the ED with concerns for asthma exacerbation x today. Notes that he has used his inhaler that he was given 3 weeks ago. No other meds tried. Has chest tightness and wheezing. Denies cough, fever.   The history is provided by the patient. No language interpreter was used.       Home Medications Prior to Admission medications   Medication Sig Start Date End Date Taking? Authorizing Provider  albuterol (VENTOLIN HFA) 108 (90 Base) MCG/ACT inhaler Inhale 2 puffs into the lungs every 4 (four) hours while awake for 2 days, THEN 2 puffs every 4 (four) hours as needed for wheezing or shortness of breath. 12/07/22 03/09/23  Narda Bonds, MD  amLODipine (NORVASC) 10 MG tablet Take 1 tablet (10 mg total) by mouth daily. 12/07/22 03/07/23  Narda Bonds, MD      Allergies    Banana and Other    Review of Systems   Review of Systems  All other systems reviewed and are negative.   Physical Exam Updated Vital Signs BP (!) 143/93 (BP Location: Left Arm)   Pulse 62   Temp 97.9 F (36.6 C) (Oral)   Ht 5\' 8"  (1.727 m)   Wt 63 kg   SpO2 95%   BMI 21.13 kg/m  Physical Exam Vitals and nursing note reviewed.  Constitutional:      General: He is not in acute distress.    Appearance: Normal appearance.  Eyes:     General: No scleral icterus.    Extraocular Movements: Extraocular movements intact.  Cardiovascular:     Rate and Rhythm: Normal rate and regular rhythm.     Pulses: Normal pulses.     Heart sounds: Normal heart sounds.  Pulmonary:     Effort: Pulmonary effort is normal. No respiratory distress.     Breath sounds: Wheezing present.     Comments: Expiratory wheezing noted diffusely throughout all lung fields. Able to  speak in clear complete sentences.  Abdominal:     Palpations: Abdomen is soft. There is no mass.     Tenderness: There is no abdominal tenderness.  Musculoskeletal:        General: Normal range of motion.     Cervical back: Neck supple.  Skin:    General: Skin is warm and dry.     Findings: No rash.  Neurological:     Mental Status: He is alert.     Sensory: Sensation is intact.     Motor: Motor function is intact.  Psychiatric:        Behavior: Behavior normal.     ED Results / Procedures / Treatments   Labs (all labs ordered are listed, but only abnormal results are displayed) Labs Reviewed  BASIC METABOLIC PANEL - Abnormal; Notable for the following components:      Result Value   Calcium 8.6 (*)    All other components within normal limits  CBC WITH DIFFERENTIAL/PLATELET - Abnormal; Notable for the following components:   RBC 4.20 (*)    All other components within normal limits  BRAIN NATRIURETIC PEPTIDE    EKG None  Radiology DG Chest 2 View  Result Date: 12/13/2022 CLINICAL DATA:  shortness of breath,  asthma exacerbation EXAM: CHEST - 2 VIEW COMPARISON:  None Available. FINDINGS: The lungs appear hyperinflated. The cardiomediastinal silhouette is within normal limits. No pleural effusion. No pneumothorax. No mass or consolidation. No acute osseous abnormality. IMPRESSION: Expanded lung volumes compatible with underlying small airways disease. No acute focal pulmonary process. Electronically Signed   By: Olive Bass M.D.   On: 12/13/2022 09:36    Procedures Procedures    Medications Ordered in ED Medications  methylPREDNISolone sodium succinate (SOLU-MEDROL) 125 mg/2 mL injection 125 mg (125 mg Intravenous Given 12/13/22 0914)  albuterol (VENTOLIN HFA) 108 (90 Base) MCG/ACT inhaler 2 puff (2 puffs Inhalation Given 12/13/22 1053)    ED Course/ Medical Decision Making/ A&P Clinical Course as of 12/13/22 1212  Tue Dec 13, 2022  1033 Pt resting comfortably on  stretcher, asleep. Lungs auscultated, improvement of lung sounds noted.  [SB]  1120 Patient ambulated with oxygen saturation remaining at 94%.  Patient asymptomatic. [SB]  1129 Re-evaluated and noted improvement of symptoms with treatment regimen. Discussed discharge treatment plan. Pt agreeable at this time. Pt appears safe for discharge. [SB]    Clinical Course User Index [SB] Rad Gramling A, PA-C                                 Medical Decision Making Amount and/or Complexity of Data Reviewed Labs: ordered. Radiology: ordered.  Risk Prescription drug management.   Patient with acute asthma exacerbation onset today.  Ran out of his albuterol inhaler.  Denies sick contacts. On exam patient with expiratory wheezing noted diffusely throughout all lung fields.  Able to speak in clear complete sentences.Otherwise no acute cardiovascular or abdominal exam findings.  Vital signs stable.  Patient afebrile, not tachycardic or hypoxic.  Differential diagnosis includes asthma exacerbation, pneumonia, viral URI with cough.   Labs:  I ordered, and personally interpreted labs.  The pertinent results include:   BMP, CBC, BNP unremarkable  Imaging: I ordered imaging studies including CXR I independently visualized and interpreted imaging which showed  Expanded lung volumes compatible with underlying small airways  disease. No acute focal pulmonary process.   I agree with the radiologist interpretation  Medications:  I ordered medication including Solu-Medrol, albuterol inhaler for symptom management Reevaluation of the patient after these medicines and interventions, I reevaluated the patient and found that they have improved Following breathing treatment, lungs clear to auscultation bilaterally, patient vital signs stable, no hypoxia.  Patient reports improvement in symptoms.  Ambulatory pulse ox monitoring shows patient O2 saturation of 94% on room air without wheezing or shortness of  breath. I have reviewed the patients home medicines and have made adjustments as needed   Disposition: Pt presentation suspicious for asthma exacerbation.  Doubt concerns at this time for pneumonia, viral URI with cough.  After consideration of the diagnostic results and the patients response to treatment, I feel that the patient would benefit from Discharge home.  Patient discharged home with albuterol inhaler.  Patient instructed on proper use of albuterol inhaler.  Patient is hemodynamically stable.  Supportive care and strict return precautions discussed with patient.  Patient acknowledge and voices understanding. Appears safe for discharge at this time.  Follow-up as indicated in discharge paperwork.    This chart was dictated using voice recognition software, Dragon. Despite the best efforts of this provider to proofread and correct errors, errors may still occur which can change documentation meaning.   Final Clinical  Impression(s) / ED Diagnoses Final diagnoses:  Exacerbation of asthma, unspecified asthma severity, unspecified whether persistent    Rx / DC Orders ED Discharge Orders     None         Zanayah Shadowens A, PA-C 12/13/22 1212    Gerhard Munch, MD 12/13/22 1523

## 2022-12-13 NOTE — ED Notes (Signed)
Pt discharged home. Discharge information discussed. No s/s of distress observed during discharge. 

## 2022-12-13 NOTE — Discharge Instructions (Addendum)
It was a pleasure taking care of you today!   Your chest x-ray did not show any concerning emergent findings at this time.  You have been provided with an albuterol inhaler, you may utilize 1-2 puffs every 4-6 hours as needed for chest tightness, wheezing, trouble breathing.  You may follow-up with your primary care provider as needed.  Return to emergency department if you experience increasing/worsening symptoms.

## 2022-12-13 NOTE — ED Notes (Signed)
Pulse while ambulating 94% on RA

## 2023-01-26 ENCOUNTER — Encounter (HOSPITAL_COMMUNITY): Payer: Self-pay

## 2023-01-26 ENCOUNTER — Emergency Department (HOSPITAL_COMMUNITY)
Admission: EM | Admit: 2023-01-26 | Discharge: 2023-01-26 | Disposition: A | Discharge status: Home / Self Care | Payer: Commercial Managed Care - HMO | Attending: Emergency Medicine | Admitting: Emergency Medicine

## 2023-01-26 ENCOUNTER — Emergency Department (HOSPITAL_COMMUNITY): Payer: Commercial Managed Care - HMO

## 2023-01-26 DIAGNOSIS — J4541 Moderate persistent asthma with (acute) exacerbation: Secondary | ICD-10-CM | POA: Insufficient documentation

## 2023-01-26 DIAGNOSIS — J45901 Unspecified asthma with (acute) exacerbation: Secondary | ICD-10-CM

## 2023-01-26 DIAGNOSIS — Z1152 Encounter for screening for COVID-19: Secondary | ICD-10-CM | POA: Diagnosis not present

## 2023-01-26 LAB — SARS CORONAVIRUS 2 BY RT PCR: SARS Coronavirus 2 by RT PCR: NEGATIVE

## 2023-01-26 MED ORDER — ALBUTEROL SULFATE HFA 108 (90 BASE) MCG/ACT IN AERS
2.0000 | INHALATION_SPRAY | Freq: Once | RESPIRATORY_TRACT | Status: AC
  Administered 2023-01-26: 2 via RESPIRATORY_TRACT
  Filled 2023-01-26: qty 6.7

## 2023-01-26 MED ORDER — GABAPENTIN 400 MG PO CAPS
800.0000 mg | ORAL_CAPSULE | Freq: Once | ORAL | Status: AC
  Administered 2023-01-26: 800 mg via ORAL
  Filled 2023-01-26: qty 2

## 2023-01-26 MED ORDER — IPRATROPIUM-ALBUTEROL 0.5-2.5 (3) MG/3ML IN SOLN
3.0000 mL | Freq: Once | RESPIRATORY_TRACT | Status: AC
  Administered 2023-01-26: 3 mL via RESPIRATORY_TRACT
  Filled 2023-01-26: qty 3

## 2023-01-26 MED ORDER — ALBUTEROL SULFATE (2.5 MG/3ML) 0.083% IN NEBU
2.5000 mg | INHALATION_SOLUTION | Freq: Once | RESPIRATORY_TRACT | Status: AC
  Administered 2023-01-26: 2.5 mg via RESPIRATORY_TRACT
  Filled 2023-01-26: qty 3

## 2023-01-26 MED ORDER — PREDNISONE 20 MG PO TABS: ORAL_TABLET | ORAL | 0 refills | Status: DC

## 2023-01-26 NOTE — ED Triage Notes (Signed)
BIB [facility] for asthma exacerbation, out of his inhaler. 2 Duonebs,125 Solumedrol given by EMS. homeless

## 2023-01-26 NOTE — ED Provider Notes (Addendum)
[place] EMERGENCY DEPARTMENT AT [facility] Provider Note   CSN: [id number] Arrival date & time: 01/26/23  7829     History  Chief Complaint  Patient presents with   Shortness of Breath    Frank [name] is a 53 y.o. male.  HPI 52 year old male presents with an asthma exacerbation. He started feeling short of breath around 1:30 AM.  Previous to this he was well.  He has a chronic cough that is unchanged.  He has developed chest tightness which she always gets with similar asthma exacerbations.  He denies any leg swelling.  He was given albuterol and Solu-Medrol by EMS and is feeling better but not quite back to baseline.  Home Medications Prior to Admission medications   Medication Sig Start Date End Date Taking? Authorizing Provider  predniSONE (DELTASONE) 20 MG tablet 2 tabs po daily x 4 days 01/26/23  Yes Pricilla Loveless, MD  albuterol (VENTOLIN HFA) 108 (90 Base) MCG/ACT inhaler Inhale 2 puffs into the lungs every 4 (four) hours while awake for 2 days, THEN 2 puffs every 4 (four) hours as needed for wheezing or shortness of breath. 12/07/22 03/09/23  Narda Bonds, MD  amLODipine (NORVASC) 10 MG tablet Take 1 tablet (10 mg total) by mouth daily. 12/07/22 03/07/23  Narda Bonds, MD      Allergies    Banana and Other    Review of Systems   Review of Systems  Constitutional:  Negative for fever.  Respiratory:  Positive for cough, chest tightness and shortness of breath.   Cardiovascular:  Negative for leg swelling.  Gastrointestinal:  Negative for abdominal pain.    Physical Exam Updated Vital Signs BP (!) 140/95 (BP Location: Right Arm)   Pulse 97   Temp 98.2 F (36.8 C) (Oral)   Resp 19   SpO2 99%  Physical Exam Vitals and nursing note reviewed.  Constitutional:      Appearance: He is well-developed.  HENT:     Head: Normocephalic and atraumatic.  Cardiovascular:     Rate and Rhythm: Normal rate and regular rhythm.     Heart sounds: Normal  heart sounds.  Pulmonary:     Effort: Pulmonary effort is normal. No tachypnea, accessory muscle usage or respiratory distress.     Breath sounds: Wheezing (diffuse, expiratory) present.     Comments: Speaking clearly, in full sentences. Joking.  Abdominal:     Palpations: Abdomen is soft.     Tenderness: There is no abdominal tenderness.  Musculoskeletal:     Right lower leg: No edema.     Left lower leg: No edema.  Skin:    General: Skin is warm and dry.  Neurological:     Mental Status: He is alert.     ED Results / Procedures / Treatments   Labs (all labs ordered are listed, but only abnormal results are displayed) Labs Reviewed  SARS CORONAVIRUS 2 BY RT PCR    EKG EKG Interpretation Date/Time:  Thursday January 26 2023 07:32:27 EDT Ventricular Rate:  83 PR Interval:  125 QRS Duration:  82 QT Interval:  369 QTC Calculation: 434 R Axis:   83  Text Interpretation: Sinus rhythm Consider right atrial enlargement Nonspecific T abnrm, anterolateral leads ST elev, probable normal early repol pattern no significant change since Aug 2024 Confirmed by Pricilla Loveless [phone]) on 01/26/2023 7:43:09 AM  Radiology DG Chest 2 View  Result Date: 01/26/2023 CLINICAL DATA:  Asthma.  Cough. EXAM: CHEST -  2 VIEW COMPARISON:  12/13/2022. FINDINGS: Bilateral lungs appear hyperexpanded. Bilateral lungs otherwise appear clear. No significant peribronchial cuffing noted. No dense consolidation or lung collapse. Bilateral costophrenic angles are clear. Normal cardio-mediastinal silhouette. No acute osseous abnormalities. The soft tissues are within normal limits. IMPRESSION: No acute cardiopulmonary process. Pulmonary hyperinflation, compatible with history of asthma. Electronically Signed   By: Jules Schick M.D.   On: 01/26/2023 08:52    Procedures Procedures    Medications Ordered in ED Medications  ipratropium-albuterol (DUONEB) 0.5-2.5 (3) MG/3ML nebulizer solution 3 mL (3 mLs  Nebulization Given 01/26/23 0738)  albuterol (PROVENTIL) (2.5 MG/3ML) 0.083% nebulizer solution 2.5 mg (2.5 mg Nebulization Given 01/26/23 0738)  gabapentin (NEURONTIN) capsule 800 mg (800 mg Oral Given 01/26/23 0906)  albuterol (VENTOLIN HFA) 108 (90 Base) MCG/ACT inhaler 2 puff (2 puffs Inhalation Given 01/26/23 0906)    ED Course/ Medical Decision Making/ A&P                                 Medical Decision Making Amount and/or Complexity of Data Reviewed Labs: ordered.    Details: COVID-negative  Radiology: ordered and independent interpretation performed.    Details: No pneumonia ECG/medicine tests: ordered and independent interpretation performed.    Details: No ischemia  Risk Prescription drug management. Diagnosis or treatment significantly limited by social determinants of health.   Patient presents for asthma exacerbation.  Appears relatively mild.  He is comfortable here and breathing well.  Does have some wheezing and was given a breathing treatment and now is for better. x-ray is clear.  He has some chest tightness but states he always has this with asthma and so I do not think ACS, PE, or other acute pathology is very likely.  I think this is a mild exacerbation and he will be with steroids, will give an inhaler here or, and refer him to pulmonology.    He is also complaining of some chronic foot pain from neuropathy and states he takes 800 mg gabapentin and was given a dose here.  Will discharge home with return precautions.        Final Clinical Impression(s) / ED Diagnoses Final diagnoses:  Moderate asthma with exacerbation, unspecified whether persistent    Rx / DC Orders ED Discharge Orders          Ordered    predniSONE (DELTASONE) 20 MG tablet        01/26/23 0901              Pricilla Loveless, MD 01/26/23 7829    Pricilla Loveless, MD 01/26/23 [phone]

## 2023-01-26 NOTE — Discharge Instructions (Addendum)
It is important to stop smoking. This can help reduce your asthma attacks. Use the albuterol inhaler 1-2 puffs every 4 hours for cough, wheezing or shortness of breath.  You are being prescribed a steroid, prednisone. Start this tomorrow (10/4).  If you develop new or worsening shortness of breath, fever, coughing up blood, chest pain, or any other new/concerning symptoms, then return to the ER or call 911.

## 2023-02-09 ENCOUNTER — Emergency Department (HOSPITAL_COMMUNITY)
Admission: EM | Admit: 2023-02-09 | Discharge: 2023-02-09 | Disposition: A | Discharge status: Home / Self Care | Payer: Commercial Managed Care - HMO | Attending: Emergency Medicine | Admitting: Emergency Medicine

## 2023-02-09 ENCOUNTER — Emergency Department (HOSPITAL_COMMUNITY): Payer: Commercial Managed Care - HMO

## 2023-02-09 ENCOUNTER — Encounter (HOSPITAL_COMMUNITY): Payer: Self-pay

## 2023-02-09 DIAGNOSIS — J4531 Mild persistent asthma with (acute) exacerbation: Secondary | ICD-10-CM | POA: Diagnosis not present

## 2023-02-09 DIAGNOSIS — R0602 Shortness of breath: Secondary | ICD-10-CM | POA: Diagnosis present

## 2023-02-09 DIAGNOSIS — J4541 Moderate persistent asthma with (acute) exacerbation: Secondary | ICD-10-CM

## 2023-02-09 HISTORY — DX: Homelessness unspecified: Z59.00

## 2023-02-09 MED ORDER — ALBUTEROL SULFATE HFA 108 (90 BASE) MCG/ACT IN AERS
2.0000 | INHALATION_SPRAY | Freq: Once | RESPIRATORY_TRACT | Status: AC
  Administered 2023-02-09: 2 via RESPIRATORY_TRACT
  Filled 2023-02-09: qty 6.7

## 2023-02-09 MED ORDER — IPRATROPIUM-ALBUTEROL 0.5-2.5 (3) MG/3ML IN SOLN
3.0000 mL | Freq: Once | RESPIRATORY_TRACT | Status: AC
  Administered 2023-02-09: 3 mL via RESPIRATORY_TRACT
  Filled 2023-02-09: qty 3

## 2023-02-09 MED ORDER — PREDNISONE 10 MG PO TABS: 40.0000 mg | ORAL_TABLET | Freq: Every day | ORAL | 0 refills | Status: AC

## 2023-02-09 NOTE — ED Triage Notes (Signed)
Pt BIB [facility] from home for asthma exacerbation that started tonight, pt is currently out of his rescue inhaler, pt was prescribe one on 10/3, but pt reports no money to afford. Pt was around a [employer], unsure if that was caused his asthma flare-up, pt reports lives in tent. A&O x4 on arrival, speaking in complete sentences, sats 94 % RA. Pt reports chest pain 7/10.   EMS administer 15mg  albuterol NEB, 0.5mg   Atrovent, 125 mg solumedrol,  2 gram mag IV PTA.

## 2023-02-09 NOTE — ED Provider Notes (Signed)
[place] EMERGENCY DEPARTMENT AT [facility] Provider Note  CSN: [id number] Arrival date & time: 02/09/23 4098  Chief Complaint(s) Asthma  HPI Frank [name] is a 52 y.o. male with a past medical history listed below who presents to the emergency department with shortness of breath that began several hours ago.  Patient reports being homeless and [place].  Given the change in weather, he has had to use his inhaler more frequently and has run out.  Shortness of breath was severe and patient called EMS who provided him with DuoNeb, magnesium and Solu-Medrol.  Currently endorsing improved work of breathing.  The history is provided by the patient.    Past Medical History Past Medical History:  Diagnosis Date   Arthritis    Asthma    Bronchitis    Gout    Homeless    Kidney failure    Renal disorder    Rib fracture    Patient Active Problem List   Diagnosis Date Noted   Asthma exacerbation 12/08/2022   Acute respiratory failure with hypoxia (HCC) 12/06/2022   Hyperglycemia 12/06/2022   Hypertension 12/06/2022   Traumatic rhabdomyolysis (HCC)    Elevated troponin    AKI (acute kidney injury) (HCC) 01/26/2021   Elevated LFTs 01/26/2021   Alcohol abuse 01/26/2021   Polysubstance abuse (HCC) 01/26/2021   Diarrhea 01/26/2021   Home Medication(s) Prior to Admission medications   Medication Sig Start Date End Date Taking? Authorizing Provider  predniSONE (DELTASONE) 10 MG tablet Take 4 tablets (40 mg total) by mouth daily for 4 days. 02/09/23 02/13/23 Yes Angalena Cousineau, Amadeo Garnet, MD  albuterol (VENTOLIN HFA) 108 (90 Base) MCG/ACT inhaler Inhale 2 puffs into the lungs every 4 (four) hours while awake for 2 days, THEN 2 puffs every 4 (four) hours as needed for wheezing or shortness of breath. Patient not taking: Reported on 02/09/2023 12/07/22 03/09/23  Narda Bonds, MD  amLODipine (NORVASC) 10 MG tablet Take 1 tablet (10 mg total) by mouth  daily. Patient not taking: Reported on 02/09/2023 12/07/22 03/07/23  Narda Bonds, MD                                                                                                                                    Allergies Asa [aspirin], Banana, Other, Pineapple, and Tylenol [acetaminophen]  Review of Systems Review of Systems As noted in HPI  Physical Exam Vital Signs  I have reviewed the triage vital signs BP 136/78   Pulse 78   Temp 98.4 F (36.9 C) (Oral)   Resp 16   Ht 5\' 8"  (1.727 m)   Wt 59 kg   SpO2 97%   BMI 19.77 kg/m   Physical Exam Vitals reviewed.  Constitutional:      General: He is not in acute distress.    Appearance: He is well-developed. He is not diaphoretic.  HENT:  Head: Normocephalic and atraumatic.     Nose: Nose normal.  Eyes:     General: No scleral icterus.       Right eye: No discharge.        Left eye: No discharge.     Conjunctiva/sclera: Conjunctivae normal.     Pupils: Pupils are equal, round, and reactive to light.  Cardiovascular:     Rate and Rhythm: Normal rate and regular rhythm.     Heart sounds: No murmur heard.    No friction rub. No gallop.  Pulmonary:     Effort: Pulmonary effort is normal. No respiratory distress.     Breath sounds: No stridor. Examination of the right-middle field reveals wheezing. Examination of the left-middle field reveals wheezing. Wheezing present. No rales.  Abdominal:     General: There is no distension.     Palpations: Abdomen is soft.     Tenderness: There is no abdominal tenderness.  Musculoskeletal:        General: No tenderness.     Cervical back: Normal range of motion and neck supple.  Skin:    General: Skin is warm and dry.     Findings: No erythema or rash.  Neurological:     Mental Status: He is alert and oriented to person, place, and time.     ED Results and Treatments Labs (all labs ordered are listed, but only abnormal results are displayed) Labs Reviewed - No data  to display                                                                                                                       EKG  EKG Interpretation Date/Time:    Ventricular Rate:    PR Interval:    QRS Duration:    QT Interval:    QTC Calculation:   R Axis:      Text Interpretation:         Radiology DG Chest Port 1 View  Result Date: 02/09/2023 CLINICAL DATA:  52 year old male with history of shortness of breath. EXAM: PORTABLE CHEST 1 VIEW COMPARISON:  Chest x-ray 01/26/2023. FINDINGS: Lung volumes are normal. No consolidative airspace disease. No pleural effusions. No pneumothorax. No pulmonary nodule or mass noted. Pulmonary vasculature and the cardiomediastinal silhouette are within normal limits. IMPRESSION: No radiographic evidence of acute cardiopulmonary disease. Electronically Signed   By: Trudie Reed M.D.   On: 02/09/2023 06:42    Medications Ordered in ED Medications  ipratropium-albuterol (DUONEB) 0.5-2.5 (3) MG/3ML nebulizer solution 3 mL (3 mLs Nebulization Given 02/09/23 0446)  albuterol (VENTOLIN HFA) 108 (90 Base) MCG/ACT inhaler 2 puff (2 puffs Inhalation Provided for home use 02/09/23 0731)   Procedures Procedures  (including critical care time) Medical Decision Making / ED Course   Medical Decision Making Amount and/or Complexity of Data Reviewed Radiology: ordered and independent interpretation performed. Decision-making details documented in ED Course.  Risk Prescription drug management.     Clinical Course as of 02/09/23 0750  Thu Feb 09, 2023  4098 Presentation is consistent with asthma exacerbation.  Chest x-ray negative for pneumonia, pneumothorax, pulmonary edema or pleural effusions.  Patient provided with additional dose and albuterol puffs.  Significant relief.  On reassessment, patient was satting well on room air without increased work of breathing and resolved wheezing.  Stable for outpatient management. [PC]    Clinical  Course User Index [PC] Meliah Appleman, Amadeo Garnet, MD    Final Clinical Impression(s) / ED Diagnoses Final diagnoses:  Moderate persistent asthma with exacerbation   The patient appears reasonably screened and/or stabilized for discharge and I doubt any other medical condition or other [facility] requiring further screening, evaluation, or treatment in the ED at this time. I have discussed the findings, Dx and Tx plan with the patient/family who expressed understanding and agree(s) with the plan. Discharge instructions discussed at length. The patient/family was given strict return precautions who verbalized understanding of the instructions. No further questions at time of discharge.  Disposition: Discharge  Condition: Good  ED Discharge Orders          Ordered    predniSONE (DELTASONE) 10 MG tablet  Daily        02/09/23 0719              Follow Up: Primary care provider  Call  to schedule an appointment for close follow up     This chart was dictated using voice recognition software.  Despite best efforts to proofread,  errors can occur which can change the documentation meaning.    Nira Conn, MD 02/09/23 [phone]

## 2023-02-09 NOTE — ED Notes (Signed)
Pt ambulated to the bathroom without incident.

## 2023-04-26 ENCOUNTER — Other Ambulatory Visit: Payer: Self-pay

## 2023-04-26 ENCOUNTER — Emergency Department (HOSPITAL_COMMUNITY): Payer: Medicaid Other

## 2023-04-26 ENCOUNTER — Encounter (HOSPITAL_COMMUNITY): Payer: Self-pay

## 2023-04-26 ENCOUNTER — Inpatient Hospital Stay (HOSPITAL_COMMUNITY)
Admission: EM | Admit: 2023-04-26 | Discharge: 2023-04-28 | DRG: 190 | Disposition: A | Discharge status: Home / Self Care | Payer: Medicaid Other | Attending: Internal Medicine | Admitting: Internal Medicine

## 2023-04-26 DIAGNOSIS — J441 Chronic obstructive pulmonary disease with (acute) exacerbation: Secondary | ICD-10-CM | POA: Diagnosis not present

## 2023-04-26 DIAGNOSIS — Z91048 Other nonmedicinal substance allergy status: Secondary | ICD-10-CM

## 2023-04-26 DIAGNOSIS — Z8249 Family history of ischemic heart disease and other diseases of the circulatory system: Secondary | ICD-10-CM

## 2023-04-26 DIAGNOSIS — I10 Essential (primary) hypertension: Secondary | ICD-10-CM | POA: Diagnosis present

## 2023-04-26 DIAGNOSIS — Z716 Tobacco abuse counseling: Secondary | ICD-10-CM

## 2023-04-26 DIAGNOSIS — F1410 Cocaine abuse, uncomplicated: Secondary | ICD-10-CM | POA: Diagnosis present

## 2023-04-26 DIAGNOSIS — Z91018 Allergy to other foods: Secondary | ICD-10-CM

## 2023-04-26 DIAGNOSIS — Z808 Family history of malignant neoplasm of other organs or systems: Secondary | ICD-10-CM

## 2023-04-26 DIAGNOSIS — Z818 Family history of other mental and behavioral disorders: Secondary | ICD-10-CM

## 2023-04-26 DIAGNOSIS — J45901 Unspecified asthma with (acute) exacerbation: Secondary | ICD-10-CM | POA: Diagnosis present

## 2023-04-26 DIAGNOSIS — F1010 Alcohol abuse, uncomplicated: Secondary | ICD-10-CM | POA: Diagnosis present

## 2023-04-26 DIAGNOSIS — Z5900 Homelessness unspecified: Secondary | ICD-10-CM

## 2023-04-26 DIAGNOSIS — Z888 Allergy status to other drugs, medicaments and biological substances status: Secondary | ICD-10-CM

## 2023-04-26 DIAGNOSIS — J9601 Acute respiratory failure with hypoxia: Secondary | ICD-10-CM | POA: Diagnosis present

## 2023-04-26 DIAGNOSIS — D72829 Elevated white blood cell count, unspecified: Secondary | ICD-10-CM | POA: Diagnosis present

## 2023-04-26 DIAGNOSIS — Z1152 Encounter for screening for COVID-19: Secondary | ICD-10-CM

## 2023-04-26 DIAGNOSIS — Z886 Allergy status to analgesic agent status: Secondary | ICD-10-CM

## 2023-04-26 DIAGNOSIS — Z79899 Other long term (current) drug therapy: Secondary | ICD-10-CM

## 2023-04-26 DIAGNOSIS — F17210 Nicotine dependence, cigarettes, uncomplicated: Secondary | ICD-10-CM | POA: Diagnosis present

## 2023-04-26 LAB — RAPID URINE DRUG SCREEN, HOSP PERFORMED
Amphetamines: NOT DETECTED
Barbiturates: NOT DETECTED
Benzodiazepines: NOT DETECTED
Cocaine: POSITIVE — AB
Opiates: NOT DETECTED
Tetrahydrocannabinol: NOT DETECTED

## 2023-04-26 LAB — RESP PANEL BY RT-PCR (RSV, FLU A&B, COVID)  RVPGX2
Influenza A by PCR: NEGATIVE
Influenza B by PCR: NEGATIVE
Resp Syncytial Virus by PCR: NEGATIVE
SARS Coronavirus 2 by RT PCR: NEGATIVE

## 2023-04-26 LAB — RESPIRATORY PANEL BY PCR

## 2023-04-26 LAB — CBC
HCT: 40.6 % (ref 39.0–52.0)
Hemoglobin: 14 g/dL (ref 13.0–17.0)
MCH: 34.1 pg — ABNORMAL HIGH (ref 26.0–34.0)
MCHC: 34.5 g/dL (ref 30.0–36.0)
MCV: 98.8 fL (ref 80.0–100.0)
Platelets: 315 10*3/uL (ref 150–400)
RBC: 4.11 MIL/uL — ABNORMAL LOW (ref 4.22–5.81)
RDW: 12.8 % (ref 11.5–15.5)
WBC: 18.7 10*3/uL — ABNORMAL HIGH (ref 4.0–10.5)
nRBC: 0 % (ref 0.0–0.2)

## 2023-04-26 LAB — MAGNESIUM: Magnesium: 2.1 mg/dL (ref 1.7–2.4)

## 2023-04-26 LAB — COMPREHENSIVE METABOLIC PANEL
ALT: 22 U/L (ref 0–44)
AST: 29 U/L (ref 15–41)
Albumin: 3.5 g/dL (ref 3.5–5.0)
Alkaline Phosphatase: 71 U/L (ref 38–126)
Anion gap: 7 (ref 5–15)
BUN: 14 mg/dL (ref 6–20)
CO2: 27 mmol/L (ref 22–32)
Calcium: 8.8 mg/dL — ABNORMAL LOW (ref 8.9–10.3)
Chloride: 101 mmol/L (ref 98–111)
Creatinine, Ser: 1.02 mg/dL (ref 0.61–1.24)
GFR, Estimated: >60 mL/min (ref >60)
Glucose, Bld: 116 mg/dL — ABNORMAL HIGH (ref 70–99)
Potassium: 4 mmol/L (ref 3.5–5.1)
Sodium: 135 mmol/L (ref 135–145)
Total Bilirubin: 0.7 mg/dL (ref 0.0–1.2)
Total Protein: 7.4 g/dL (ref 6.5–8.1)

## 2023-04-26 LAB — ETHANOL: Alcohol, Ethyl (B): <10 mg/dL (ref <10)

## 2023-04-26 MED ORDER — BISACODYL 5 MG PO TBEC
5.0000 mg | DELAYED_RELEASE_TABLET | Freq: Every day | ORAL | Status: DC | PRN
  Administered 2023-04-26: 5 mg via ORAL
  Filled 2023-04-26: qty 1

## 2023-04-26 MED ORDER — TRAMADOL HCL 50 MG PO TABS
50.0000 mg | ORAL_TABLET | Freq: Three times a day (TID) | ORAL | Status: DC | PRN
  Administered 2023-04-27: 50 mg via ORAL
  Filled 2023-04-26: qty 1

## 2023-04-26 MED ORDER — ALBUTEROL SULFATE (2.5 MG/3ML) 0.083% IN NEBU
2.5000 mg | INHALATION_SOLUTION | RESPIRATORY_TRACT | Status: DC | PRN
  Administered 2023-04-27: 2.5 mg via RESPIRATORY_TRACT
  Filled 2023-04-26: qty 3

## 2023-04-26 MED ORDER — IPRATROPIUM-ALBUTEROL 0.5-2.5 (3) MG/3ML IN SOLN
3.0000 mL | Freq: Four times a day (QID) | RESPIRATORY_TRACT | Status: DC
Start: 2023-04-26 — End: 2023-04-26
  Administered 2023-04-26: 3 mL via RESPIRATORY_TRACT
  Filled 2023-04-26: qty 3

## 2023-04-26 MED ORDER — TRAZODONE HCL 50 MG PO TABS
25.0000 mg | ORAL_TABLET | Freq: Every evening | ORAL | Status: DC | PRN
  Administered 2023-04-26: 25 mg via ORAL
  Filled 2023-04-26: qty 1

## 2023-04-26 MED ORDER — METHYLPREDNISOLONE SODIUM SUCC 40 MG IJ SOLR
40.0000 mg | Freq: Two times a day (BID) | INTRAMUSCULAR | Status: DC
  Administered 2023-04-27 – 2023-04-28 (×3): 40 mg via INTRAVENOUS
  Filled 2023-04-26 (×3): qty 1

## 2023-04-26 MED ORDER — ONDANSETRON HCL 4 MG/2ML IJ SOLN: 4.0000 mg | Freq: Four times a day (QID) | INTRAMUSCULAR | Status: DC | PRN

## 2023-04-26 MED ORDER — ONDANSETRON HCL 4 MG PO TABS: 4.0000 mg | ORAL_TABLET | Freq: Four times a day (QID) | ORAL | Status: DC | PRN

## 2023-04-26 MED ORDER — SODIUM CHLORIDE 0.9 % IV SOLN
500.0000 mg | Freq: Once | INTRAVENOUS | Status: AC
  Administered 2023-04-26: 500 mg via INTRAVENOUS
  Filled 2023-04-26: qty 5

## 2023-04-26 MED ORDER — SODIUM CHLORIDE 0.9 % IV SOLN
1.0000 g | Freq: Once | INTRAVENOUS | Status: AC
  Administered 2023-04-26: 1 g via INTRAVENOUS
  Filled 2023-04-26: qty 10

## 2023-04-26 MED ORDER — ALBUTEROL SULFATE (2.5 MG/3ML) 0.083% IN NEBU
5.0000 mg | INHALATION_SOLUTION | Freq: Once | RESPIRATORY_TRACT | Status: AC
  Administered 2023-04-26: 5 mg via RESPIRATORY_TRACT
  Filled 2023-04-26: qty 6

## 2023-04-26 MED ORDER — METHYLPREDNISOLONE SODIUM SUCC 125 MG IJ SOLR
125.0000 mg | Freq: Once | INTRAMUSCULAR | Status: AC
  Administered 2023-04-26: 125 mg via INTRAVENOUS
  Filled 2023-04-26: qty 2

## 2023-04-26 MED ORDER — IPRATROPIUM-ALBUTEROL 0.5-2.5 (3) MG/3ML IN SOLN
3.0000 mL | Freq: Three times a day (TID) | RESPIRATORY_TRACT | Status: DC
Start: 2023-04-26 — End: 2023-04-28
  Administered 2023-04-26 – 2023-04-28 (×6): 3 mL via RESPIRATORY_TRACT
  Filled 2023-04-26 (×7): qty 3

## 2023-04-26 MED ORDER — METHOCARBAMOL 500 MG PO TABS
750.0000 mg | ORAL_TABLET | Freq: Four times a day (QID) | ORAL | Status: DC | PRN
  Administered 2023-04-26 – 2023-04-28 (×3): 750 mg via ORAL
  Filled 2023-04-26 (×3): qty 2

## 2023-04-26 MED ORDER — ENOXAPARIN SODIUM 40 MG/0.4ML IJ SOSY
40.0000 mg | PREFILLED_SYRINGE | INTRAMUSCULAR | Status: DC
  Administered 2023-04-26 – 2023-04-27 (×2): 40 mg via SUBCUTANEOUS
  Filled 2023-04-26 (×2): qty 0.4

## 2023-04-26 NOTE — H&P (Signed)
 History and Physical  Frank [name] FMW:[id number] DOB: [birth year] DOA: 04/26/2023  PCP: Patient, No Pcp Per   Chief Complaint: Cough, shortness of breath  HPI: Frank [name] is a 53 y.o. male with medical history significant for tobacco abuse, asthma, chronic bronchitis being admitted to the hospital with acute exacerbation of asthma in the setting of 2 weeks of cough and dyspnea.  Patient is not a very good historian, seems to be a little bit self contradictory, but what I am able to gather is that he went to visit his daughter for the past 3 weeks for the Christmas holidays.  His daughter and grandson who are sick, once he was there he started to also develop a cough.  He had some chest tightness with cough, denies any fevers, in the last week he also started to produce some green sputum.  Apparently he has some prednisone  tablets at home from previous ER visits, told me that he has been taking prednisone  intermittently over the last 2 or 3 weeks.  However when asked when he last took prednisone , states that he has not taken any in the last 3 weeks.  He was brought in by EMS due to shortness of breath, he received 2 DuoNebs prior to arrival.  He also had an x-ray of the right foot since he kicked a tree, was having some pain and swelling.  In the ER he was given 125 mg Solu-Medrol , IV azithromycin  and IV Rocephin .  Review of Systems: Please see HPI for pertinent positives and negatives. A complete 10 system review of systems are otherwise negative.  Past Medical History:  Diagnosis Date   Arthritis    Asthma    Bronchitis    Gout    Homeless    Kidney failure    Renal disorder    Rib fracture    History reviewed. No pertinent surgical history. Social History:  reports that he has been smoking cigarettes. He has never used smokeless tobacco. He reports current alcohol use. He reports current drug use. Drugs: Cocaine, Crack cocaine, and Marijuana.  Allergies  Allergen Reactions    Asa [Aspirin]    Banana    Other Hives and Itching    Allergic to white chocolate Allergic to melons   Pineapple    Tylenol  [Acetaminophen ] Nausea And Vomiting    Family History  Problem Relation Age of Onset   Depression Mother    Anxiety disorder Mother    Throat cancer Maternal Grandfather    Hypertension Maternal Grandfather      Prior to Admission medications   Medication Sig Start Date End Date Taking? Authorizing Provider  albuterol  (VENTOLIN  HFA) 108 (90 Base) MCG/ACT inhaler Inhale 2 puffs into the lungs every 4 (four) hours while awake for 2 days, THEN 2 puffs every 4 (four) hours as needed for wheezing or shortness of breath. Patient not taking: Reported on 02/09/2023 12/07/22 03/09/23  Briana Elgin LABOR, MD  amLODipine  (NORVASC ) 10 MG tablet Take 1 tablet (10 mg total) by mouth daily. Patient not taking: Reported on 02/09/2023 12/07/22 03/07/23  Briana Elgin LABOR, MD    Physical Exam: BP 128/66   Pulse (!) 110   Temp 99.4 F (37.4 C) (Oral)   Resp (!) 24   Ht 5' 8 (1.727 m)   Wt 63.5 kg   SpO2 93%   BMI 21.29 kg/m  General:  Alert, oriented, calm, in no acute distress, resting comfortably on room air.  No significant cough, able to  speak in full sentences. Cardiovascular: RRR, no murmurs or rubs, no peripheral edema  Respiratory: Equal bilateral breath sounds, with moderately diminished air movement.  Mild rhonchi, but no active wheezing, no tachypnea. Abdomen: soft, nontender, nondistended, normal bowel tones heard  Skin: dry, no rashes  Musculoskeletal: no joint effusions, normal range of motion, his right foot is without any obvious injury, or swelling Psychiatric: appropriate affect, normal speech  Neurologic: extraocular muscles intact, clear speech, moving all extremities with intact sensorium         Labs on Admission:  Basic Metabolic Panel: Recent Labs  Lab 04/26/23 0850  NA 135  K 4.0  CL 101  CO2 27  GLUCOSE 116*  BUN 14  CREATININE 1.02   CALCIUM  8.8*   Liver Function Tests: Recent Labs  Lab 04/26/23 0850  AST 29  ALT 22  ALKPHOS 71  BILITOT 0.7  PROT 7.4  ALBUMIN 3.5   No results for input(s): LIPASE, AMYLASE in the last 168 hours. No results for input(s): AMMONIA in the last 168 hours. CBC: Recent Labs  Lab 04/26/23 0850  WBC 18.7*  HGB 14.0  HCT 40.6  MCV 98.8  PLT 315   Cardiac Enzymes: No results for input(s): CKTOTAL, CKMB, CKMBINDEX, TROPONINI in the last 168 hours. BNP (last 3 results) Recent Labs    12/06/22 0730 12/13/22 0914  BNP 22.2 32.4    ProBNP (last 3 results) No results for input(s): PROBNP in the last 8760 hours.  CBG: No results for input(s): GLUCAP in the last 168 hours.  Radiological Exams on Admission: DG Chest Port 1 View Result Date: 04/26/2023 CLINICAL DATA:  Pain after an injury, shortness of breath for 2 weeks. EXAM: PORTABLE CHEST 1 VIEW COMPARISON:  Chest radiograph dated 02/09/2023. FINDINGS: The heart size and mediastinal contours are within normal limits. Both lungs are clear. The visualized skeletal structures are unremarkable. IMPRESSION: No active disease. Electronically Signed   By: Norman Hopper M.D.   On: 04/26/2023 09:23   DG Foot 2 Views Right Result Date: 04/26/2023 CLINICAL DATA:  Pain after injury. Right foot swelling after kicking a tree. EXAM: RIGHT FOOT - 2 VIEW COMPARISON:  None Available. FINDINGS: There is no evidence of fracture or dislocation. There is no evidence of arthropathy or other focal bone abnormality. Soft tissues are unremarkable. IMPRESSION: Negative. Electronically Signed   By: Frank Roulette M.D.   On: 04/26/2023 09:22   Assessment/Plan Frank [name] is a 54 y.o. male with medical history significant for tobacco abuse, asthma, bronchitis being admitted to the hospital with acute exacerbation of COPD in the setting of 2 weeks of cough and dyspnea.  Acute exacerbation of asthma-with increased cough, sputum  production.  Presented with significant wheezing which is improved with steroids and breathing treatments given by EMS en route.  Given history of tobacco abuse, he may also have undiagnosed COPD, though no obvious emphysema on x-ray. -Observation admission -Supplemental oxygen as needed, though he is currently stable on room air -Continue DuoNebs, as needed albuterol  -Received IV Solu-Medrol  in the ER, will continue this 40 mg IV every 12 hours, next dose tomorrow morning -Given lack of fevers, and clear chest x-ray no indication for continued antibiotics at this time -Due to recent sick contacts, will check viral respiratory panel  Leukocytosis-I suspect this is due to recent intermittent prednisone , I doubt bacterial infection  Hypertension-will resume home medication once reconciled by pharmacy staff, patient unsure of what blood pressure medication he takes  DVT  prophylaxis: Lovenox      Code Status: Full Code  Consults called: None  Admission status: Observation  Time spent: 49 minutes  Jakobi Thetford CHRISTELLA Gail MD [facility] Pager [phone]  If 7PM-7AM, please contact night-coverage [email or web address] Password [email or web address]  04/26/2023, 11:05 AM

## 2023-04-26 NOTE — Plan of Care (Signed)

## 2023-04-26 NOTE — ED Triage Notes (Signed)
 Patient brought in by EMS due to shortness of breath X 2 weeks. Pt received 2 duo nebs prior to arrival by EMS. Pt reports coughing up yellow sputum. Also reports pain and swelling to the right foot.

## 2023-04-26 NOTE — ED Provider Notes (Signed)
  EMERGENCY DEPARTMENT AT [facility] Provider Note   CSN: [id number] Arrival date & time: 04/26/23  0800     History  Chief Complaint  Patient presents with   Shortness of Breath    Frank [name] is a 53 y.o. male.  HPI 53 year old male history of COPD, hypoxic respiratory failure, alcohol abuse, polysubstance abuse, presents today complaining of cough and dyspnea.  He states he has had subjective fever and cough over the past 2 weeks.  He is unable to tell me what it was exactly just feeling like he had a fever.  He has been coughing up copious yellow sputum.  He has not pain and injury to his right foot after kicking a tree 2 weeks ago.  He has been walking on it but it is painful and he has it wrapped in an Ace wrap.  He has inhalers at home but has run out of them.  He is not on any oxygen.  He reports stopping smoking 2 days ago.  He states that he has been using substances to help with his symptoms.  He reports daily drinking and cocaine use.     Home Medications Prior to Admission medications   Medication Sig Start Date End Date Taking? Authorizing Provider  albuterol  (VENTOLIN  HFA) 108 (90 Base) MCG/ACT inhaler Inhale 2 puffs into the lungs every 4 (four) hours while awake for 2 days, THEN 2 puffs every 4 (four) hours as needed for wheezing or shortness of breath. Patient not taking: Reported on 02/09/2023 12/07/22 03/09/23  Briana Elgin LABOR, MD  amLODipine  (NORVASC ) 10 MG tablet Take 1 tablet (10 mg total) by mouth daily. Patient not taking: Reported on 02/09/2023 12/07/22 03/07/23  Briana Elgin LABOR, MD      Allergies    Asa [aspirin], Banana, Other, Pineapple, and Tylenol  [acetaminophen ]    Review of Systems   Review of Systems  Physical Exam Updated Vital Signs BP 128/66   Pulse (!) 110   Temp 99.4 F (37.4 C) (Oral)   Resp (!) 24   Ht 1.727 m (5' 8)   Wt 63.5 kg   SpO2 93%   BMI 21.29 kg/m  Physical Exam Vitals reviewed.  HENT:      Head: Normocephalic.     Mouth/Throat:     Mouth: Mucous membranes are moist.  Eyes:     Pupils: Pupils are equal, round, and reactive to light.  Cardiovascular:     Rate and Rhythm: Regular rhythm. Tachycardia present.  Pulmonary:     Effort: Tachypnea present.     Breath sounds: Examination of the right-middle field reveals wheezing and rhonchi. Examination of the left-middle field reveals wheezing and rhonchi. Examination of the right-lower field reveals wheezing and rhonchi. Examination of the left-lower field reveals wheezing and rhonchi. Wheezing and rhonchi present.  Abdominal:     Palpations: Abdomen is soft.  Musculoskeletal:        General: Normal range of motion.     Cervical back: Normal range of motion.     Comments: Right foot with some tenderness to palpation in the lateral aspect  Skin:    General: Skin is warm and dry.     Capillary Refill: Capillary refill takes less than 2 seconds.  Neurological:     General: No focal deficit present.     Mental Status: He is alert.  Psychiatric:        Mood and Affect: Mood normal.     ED Results /  Procedures / Treatments   Labs (all labs ordered are listed, but only abnormal results are displayed) Labs Reviewed  CBC - Abnormal; Notable for the following components:      Result Value   WBC 18.7 (*)    RBC 4.11 (*)    MCH 34.1 (*)    All other components within normal limits  COMPREHENSIVE METABOLIC PANEL - Abnormal; Notable for the following components:   Glucose, Bld 116 (*)    Calcium  8.8 (*)    All other components within normal limits  RESP PANEL BY RT-PCR (RSV, FLU A&B, COVID)  [id number]  ETHANOL  RAPID URINE DRUG SCREEN, HOSP PERFORMED    EKG EKG Interpretation Date/Time:  Wednesday April 26 2023 08:14:23 EST Ventricular Rate:  105 PR Interval:  122 QRS Duration:  67 QT Interval:  294 QTC Calculation: 389 R Axis:   81  Text Interpretation: Sinus tachycardia Confirmed by Levander Houston [phone]) on 04/26/2023  9:17:33 AM  Radiology DG Chest Port 1 View Result Date: 04/26/2023 CLINICAL DATA:  Pain after an injury, shortness of breath for 2 weeks. EXAM: PORTABLE CHEST 1 VIEW COMPARISON:  Chest radiograph dated 02/09/2023. FINDINGS: The heart size and mediastinal contours are within normal limits. Both lungs are clear. The visualized skeletal structures are unremarkable. IMPRESSION: No active disease. Electronically Signed   By: Norman Hopper M.D.   On: 04/26/2023 09:23   DG Foot 2 Views Right Result Date: 04/26/2023 CLINICAL DATA:  Pain after injury. Right foot swelling after kicking a tree. EXAM: RIGHT FOOT - 2 VIEW COMPARISON:  None Available. FINDINGS: There is no evidence of fracture or dislocation. There is no evidence of arthropathy or other focal bone abnormality. Soft tissues are unremarkable. IMPRESSION: Negative. Electronically Signed   By: Dorn Roulette M.D.   On: 04/26/2023 09:22    Procedures Procedures    Medications Ordered in ED Medications  cefTRIAXone  (ROCEPHIN ) 1 g in sodium chloride  0.9 % 100 mL IVPB (1 g Intravenous New Bag/Given 04/26/23 0955)  azithromycin  (ZITHROMAX ) 500 mg in sodium chloride  0.9 % 250 mL IVPB (has no administration in time range)  methylPREDNISolone  sodium succinate (SOLU-MEDROL ) 125 mg/2 mL injection 125 mg (125 mg Intravenous Given 04/26/23 0851)  albuterol  (PROVENTIL ) (2.5 MG/3ML) 0.083% nebulizer solution 5 mg (5 mg Nebulization Given 04/26/23 0851)    ED Course/ Medical Decision Making/ A&P Clinical Course as of 04/26/23 1023  Wed Apr 26, 2023  0914 X-Beulah Matusek reviewed interpreted as no obvious infiltrate or pneumothorax noted [DR]  0915 Right foot x-Corley Maffeo reviewed interpreted no evidence of acute fracture noted on my interpretation but chronic changes noted awaiting radiologist interpretation [DR]  0915 CBC reviewed interpreted significant for elevated white count 18,700 with normal hemoglobin and platelets [DR]    Clinical Course User Index [DR] Levander Houston, MD                                 Medical Decision Making Amount and/or Complexity of Data Reviewed Labs: ordered. Radiology: ordered.  Risk Prescription drug management.   53 year old male history of COPD presents today with cough and fever.  He also has wheezing.  He received albuterol  prehospital.  He continued to have increased work of breathing and new oxygen demand.  He is on 4 L nasal cannula sats are 93%. Patient evaluated with chest x-Rasheen Schewe with no acute findings.  Labs were obtained CBC significant for leukocytosis at 18,000.  Complete metabolic panel with no acute abnormalities Respiratory panel is negative Patient is received Solu-Medrol , Rocephin , Zithromax  ordered, albuterol  5.  He is continuing to have some wheezing and new oxygen demand but work of breathing is improved Plan admission for ongoing evaluation and treatment        Final Clinical Impression(s) / ED Diagnoses Final diagnoses:  None    Rx / DC Orders ED Discharge Orders     None         Levander Houston, MD 05/01/23 [phone]

## 2023-04-26 NOTE — ED Notes (Signed)
 ED TO INPATIENT HANDOFF REPORT  Name/Age/Gender Frank [name] 53 y.o. male  Code Status    Code Status Orders  (From admission, onward)           Start     Ordered   04/26/23 1104  Full code  Continuous       Question:  By:  Answer:  Consent: discussion documented in EHR   04/26/23 1104           Code Status History     Date Active Date Inactive Code Status Order ID Comments User Context   12/06/2022 1003 12/08/2022 0013 Full Code [id number]  Celinda Alm Lot, MD ED   01/26/2021 2219 02/06/2021 1732 Full Code [id number]  Laurence Locus, DO ED   01/26/2021 2153 01/26/2021 2219 Full Code [id number]  Laurence Locus, DO ED       Home/SNF/Other Home  Chief Complaint COPD with acute exacerbation (HCC) [J44.1]  Level of Care/Admitting Diagnosis ED Disposition     ED Disposition  Admit   Condition  --   Comment  Hospital Area: [facility] [[id number]]  Level of Care: Med-Surg [16]  [facility] place patient in observation at [facility] or [facility] if equivalent level of care is available:: Yes  Covid Evaluation: Asymptomatic - no recent exposure (last 10 days) testing not required  Diagnosis: COPD with acute exacerbation [facility]) [[id number]]  Admitting Physician: ZELLA KATHA HERO [[id number]]  Attending Physician: [email or web address], MIR HART.GULA [[id number]]          Medical History Past Medical History:  Diagnosis Date   Arthritis    Asthma    Bronchitis    Gout    Homeless    Kidney failure    Renal disorder    Rib fracture     Allergies Allergies  Allergen Reactions   Asa [Aspirin]    Banana    Other Hives and Itching    Allergic to white chocolate Allergic to melons   Pineapple    Tylenol  [Acetaminophen ] Nausea And Vomiting    IV Location/Drains/Wounds Patient Lines/Drains/Airways Status     Active Line/Drains/Airways     Name Placement date Placement time Site Days   Peripheral IV 04/26/23 20 G Anterior;Left;Proximal Forearm 04/26/23  0850  Forearm   less than 1            Labs/Imaging Results for orders placed or performed during the hospital encounter of 04/26/23 (from the past 48 hours)  Resp panel by RT-PCR (RSV, Flu A&B, Covid) Anterior Nasal Swab     Status: None   Collection Time: 04/26/23  8:27 AM   Specimen: Anterior Nasal Swab  Result Value Ref Range   SARS Coronavirus 2 by RT PCR NEGATIVE NEGATIVE    Comment: (NOTE) SARS-CoV-2 target nucleic acids are NOT DETECTED.  The SARS-CoV-2 RNA is generally detectable in upper respiratory specimens during the acute phase of infection. The lowest concentration of SARS-CoV-2 viral copies this assay can detect is 138 copies/mL. A negative result does not preclude SARS-Cov-2 infection and should not be used as the sole basis for treatment or other patient management decisions. A negative result may occur with  improper specimen collection/handling, submission of specimen other than nasopharyngeal swab, presence of viral mutation(s) within the areas targeted by this assay, and inadequate number of viral copies(<138 copies/mL). A negative result must be combined with clinical observations, patient history, and epidemiological information. The expected result is Negative.  Fact Sheet for Patients:  bloggercourse.com  Fact Sheet  for Healthcare Providers:  seriousbroker.it  This test is no t yet approved or cleared by the [place]  FDA and  has been authorized for detection and/or diagnosis of SARS-CoV-2 by FDA under an Emergency Use Authorization (EUA). This EUA will remain  in effect (meaning this test can be used) for the duration of the COVID-19 declaration under Section 564(b)(1) of the Act, 21 U.S.C.section 360bbb-3(b)(1), unless the authorization is terminated  or revoked sooner.       Influenza A by PCR NEGATIVE NEGATIVE   Influenza B by PCR NEGATIVE NEGATIVE    Comment: (NOTE) The Xpert Xpress  SARS-CoV-2/FLU/RSV plus assay is intended as an aid in the diagnosis of influenza from Nasopharyngeal swab specimens and should not be used as a sole basis for treatment. Nasal washings and aspirates are unacceptable for Xpert Xpress SARS-CoV-2/FLU/RSV testing.  Fact Sheet for Patients: bloggercourse.com  Fact Sheet for Healthcare Providers: seriousbroker.it  This test is not yet approved or cleared by the [place]  FDA and has been authorized for detection and/or diagnosis of SARS-CoV-2 by FDA under an Emergency Use Authorization (EUA). This EUA will remain in effect (meaning this test can be used) for the duration of the COVID-19 declaration under Section 564(b)(1) of the Act, 21 U.S.C. section 360bbb-3(b)(1), unless the authorization is terminated or revoked.     Resp Syncytial Virus by PCR NEGATIVE NEGATIVE    Comment: (NOTE) Fact Sheet for Patients: bloggercourse.com  Fact Sheet for Healthcare Providers: seriousbroker.it  This test is not yet approved or cleared by the [place]  FDA and has been authorized for detection and/or diagnosis of SARS-CoV-2 by FDA under an Emergency Use Authorization (EUA). This EUA will remain in effect (meaning this test can be used) for the duration of the COVID-19 declaration under Section 564(b)(1) of the Act, 21 U.S.C. section 360bbb-3(b)(1), unless the authorization is terminated or revoked.  Performed at [facility], [address], [place], [place] [address]   CBC     Status: Abnormal   Collection Time: 04/26/23  8:50 AM  Result Value Ref Range   WBC 18.7 (H) 4.0 - 10.5 K/uL   RBC 4.11 (L) 4.22 - 5.81 MIL/uL   Hemoglobin 14.0 13.0 - 17.0 g/dL   HCT 59.3 60.9 - 47.9 %   MCV 98.8 80.0 - 100.0 fL   MCH 34.1 (H) 26.0 - 34.0 pg   MCHC 34.5 30.0 - 36.0 g/dL   RDW 87.1 88.4 - 84.4 %   Platelets 315 150 - 400  K/uL   nRBC 0.0 0.0 - 0.2 %    Comment: Performed at [facility], [address] [place], [place] [address]  Comprehensive metabolic panel     Status: Abnormal   Collection Time: 04/26/23  8:50 AM  Result Value Ref Range   Sodium 135 135 - 145 mmol/L   Potassium 4.0 3.5 - 5.1 mmol/L   Chloride 101 98 - 111 mmol/L   CO2 27 22 - 32 mmol/L   Glucose, Bld 116 (H) 70 - 99 mg/dL    Comment: Glucose reference range applies only to samples taken after fasting for at least 8 hours.   BUN 14 6 - 20 mg/dL   Creatinine, Ser 8.97 0.61 - 1.24 mg/dL   Calcium  8.8 (L) 8.9 - 10.3 mg/dL   Total Protein 7.4 6.5 - 8.1 g/dL   Albumin 3.5 3.5 - 5.0 g/dL   AST 29 15 - 41 U/L   ALT 22 0 -  44 U/L   Alkaline Phosphatase 71 38 - 126 U/L   Total Bilirubin 0.7 0.0 - 1.2 mg/dL   GFR, Estimated >39 >39 mL/min    Comment: (NOTE) Calculated using the CKD-EPI Creatinine Equation (2021)    Anion gap 7 5 - 15    Comment: Performed at [facility], [address] [place], [place] [address]  Ethanol     Status: None   Collection Time: 04/26/23  8:50 AM  Result Value Ref Range   Alcohol, Ethyl (B) <10 <10 mg/dL    Comment: (NOTE) Lowest detectable limit for serum alcohol is 10 mg/dL.  For medical purposes only. Performed at [facility], [address] [place], [place] [address]    DG Chest Port 1 View Result Date: 04/26/2023 CLINICAL DATA:  Pain after an injury, shortness of breath for 2 weeks. EXAM: PORTABLE CHEST 1 VIEW COMPARISON:  Chest radiograph dated 02/09/2023. FINDINGS: The heart size and mediastinal contours are within normal limits. Both lungs are clear. The visualized skeletal structures are unremarkable. IMPRESSION: No active disease. Electronically Signed   By: Norman Hopper M.D.   On: 04/26/2023 09:23   DG Foot 2 Views Right Result Date: 04/26/2023 CLINICAL DATA:  Pain after injury. Right foot swelling after kicking a tree. EXAM: RIGHT  FOOT - 2 VIEW COMPARISON:  None Available. FINDINGS: There is no evidence of fracture or dislocation. There is no evidence of arthropathy or other focal bone abnormality. Soft tissues are unremarkable. IMPRESSION: Negative. Electronically Signed   By: Dorn Roulette M.D.   On: 04/26/2023 09:22    Pending Labs Unresulted Labs (From admission, onward)     Start     Ordered   04/27/23 0500  Basic metabolic panel  Tomorrow morning,   R        04/26/23 1104   04/27/23 0500  CBC  Tomorrow morning,   R        04/26/23 1105   04/26/23 0827  Rapid urine drug screen (hospital performed)  ONCE - STAT,   STAT        04/26/23 0828            Vitals/Pain Today's Vitals   04/26/23 1000 04/26/23 1015 04/26/23 1045 04/26/23 1100  BP: 114/79 (!) 130/57 117/74 120/67  Pulse: (!) 110 (!) 106 99 97  Resp: (!) 26 (!) 26 20 17   Temp:      TempSrc:      SpO2: 90% 93% 93% 93%  Weight:      Height:      PainSc:        Isolation Precautions No active isolations  Medications Medications  azithromycin  (ZITHROMAX ) 500 mg in sodium chloride  0.9 % 250 mL IVPB (500 mg Intravenous New Bag/Given 04/26/23 1110)  methylPREDNISolone  sodium succinate (SOLU-MEDROL ) 40 mg/mL injection 40 mg (has no administration in time range)  enoxaparin  (LOVENOX ) injection 40 mg (has no administration in time range)  traMADol  (ULTRAM ) tablet 50 mg (has no administration in time range)  traZODone  (DESYREL ) tablet 25 mg (has no administration in time range)  ondansetron  (ZOFRAN ) tablet 4 mg (has no administration in time range)    Or  ondansetron  (ZOFRAN ) injection 4 mg (has no administration in time range)  albuterol  (PROVENTIL ) (2.5 MG/3ML) 0.083% nebulizer solution 2.5 mg (has no administration in time range)  ipratropium-albuterol  (DUONEB) 0.5-2.5 (3) MG/3ML nebulizer solution 3 mL (has no administration in time range)  methylPREDNISolone  sodium succinate (SOLU-MEDROL ) 125 mg/2 mL injection  125 mg (125 mg Intravenous  Given 04/26/23 0851)  albuterol  (PROVENTIL ) (2.5 MG/3ML) 0.083% nebulizer solution 5 mg (5 mg Nebulization Given 04/26/23 0851)  cefTRIAXone  (ROCEPHIN ) 1 g in sodium chloride  0.9 % 100 mL IVPB (0 g Intravenous Stopped 04/26/23 1025)    Mobility walks

## 2023-04-27 DIAGNOSIS — F17210 Nicotine dependence, cigarettes, uncomplicated: Secondary | ICD-10-CM | POA: Diagnosis present

## 2023-04-27 DIAGNOSIS — F1410 Cocaine abuse, uncomplicated: Secondary | ICD-10-CM | POA: Diagnosis present

## 2023-04-27 DIAGNOSIS — J45901 Unspecified asthma with (acute) exacerbation: Secondary | ICD-10-CM | POA: Diagnosis present

## 2023-04-27 DIAGNOSIS — Z91048 Other nonmedicinal substance allergy status: Secondary | ICD-10-CM | POA: Diagnosis not present

## 2023-04-27 DIAGNOSIS — Z888 Allergy status to other drugs, medicaments and biological substances status: Secondary | ICD-10-CM | POA: Diagnosis not present

## 2023-04-27 DIAGNOSIS — J441 Chronic obstructive pulmonary disease with (acute) exacerbation: Secondary | ICD-10-CM | POA: Diagnosis present

## 2023-04-27 DIAGNOSIS — J9601 Acute respiratory failure with hypoxia: Secondary | ICD-10-CM | POA: Diagnosis present

## 2023-04-27 DIAGNOSIS — I10 Essential (primary) hypertension: Secondary | ICD-10-CM | POA: Diagnosis present

## 2023-04-27 DIAGNOSIS — Z886 Allergy status to analgesic agent status: Secondary | ICD-10-CM | POA: Diagnosis not present

## 2023-04-27 DIAGNOSIS — Z8249 Family history of ischemic heart disease and other diseases of the circulatory system: Secondary | ICD-10-CM | POA: Diagnosis not present

## 2023-04-27 DIAGNOSIS — D72829 Elevated white blood cell count, unspecified: Secondary | ICD-10-CM | POA: Diagnosis present

## 2023-04-27 DIAGNOSIS — F1010 Alcohol abuse, uncomplicated: Secondary | ICD-10-CM | POA: Diagnosis present

## 2023-04-27 DIAGNOSIS — Z91018 Allergy to other foods: Secondary | ICD-10-CM | POA: Diagnosis not present

## 2023-04-27 DIAGNOSIS — Z79899 Other long term (current) drug therapy: Secondary | ICD-10-CM | POA: Diagnosis not present

## 2023-04-27 DIAGNOSIS — Z818 Family history of other mental and behavioral disorders: Secondary | ICD-10-CM | POA: Diagnosis not present

## 2023-04-27 DIAGNOSIS — Z808 Family history of malignant neoplasm of other organs or systems: Secondary | ICD-10-CM | POA: Diagnosis not present

## 2023-04-27 DIAGNOSIS — Z1152 Encounter for screening for COVID-19: Secondary | ICD-10-CM | POA: Diagnosis not present

## 2023-04-27 DIAGNOSIS — Z5900 Homelessness unspecified: Secondary | ICD-10-CM | POA: Diagnosis not present

## 2023-04-27 DIAGNOSIS — Z716 Tobacco abuse counseling: Secondary | ICD-10-CM | POA: Diagnosis not present

## 2023-04-27 LAB — BASIC METABOLIC PANEL
Anion gap: 7 (ref 5–15)
BUN: 16 mg/dL (ref 6–20)
CO2: 28 mmol/L (ref 22–32)
Calcium: 9.1 mg/dL (ref 8.9–10.3)
Chloride: 102 mmol/L (ref 98–111)
Creatinine, Ser: 1.03 mg/dL (ref 0.61–1.24)
GFR, Estimated: >60 mL/min (ref >60)
Glucose, Bld: 157 mg/dL — ABNORMAL HIGH (ref 70–99)
Potassium: 4.3 mmol/L (ref 3.5–5.1)
Sodium: 137 mmol/L (ref 135–145)

## 2023-04-27 LAB — CBC
HCT: 35.2 % — ABNORMAL LOW (ref 39.0–52.0)
Hemoglobin: 12.1 g/dL — ABNORMAL LOW (ref 13.0–17.0)
MCH: 34 pg (ref 26.0–34.0)
MCHC: 34.4 g/dL (ref 30.0–36.0)
MCV: 98.9 fL (ref 80.0–100.0)
Platelets: 322 10*3/uL (ref 150–400)
RBC: 3.56 MIL/uL — ABNORMAL LOW (ref 4.22–5.81)
RDW: 12.7 % (ref 11.5–15.5)
WBC: 17.3 10*3/uL — ABNORMAL HIGH (ref 4.0–10.5)
nRBC: 0 % (ref 0.0–0.2)

## 2023-04-27 MED ORDER — MONTELUKAST SODIUM 10 MG PO TABS
10.0000 mg | ORAL_TABLET | Freq: Every day | ORAL | Status: DC
  Administered 2023-04-27: 10 mg via ORAL
  Filled 2023-04-27: qty 1

## 2023-04-27 MED ORDER — LORATADINE 10 MG PO TABS
10.0000 mg | ORAL_TABLET | Freq: Every day | ORAL | Status: DC
  Administered 2023-04-27 – 2023-04-28 (×2): 10 mg via ORAL
  Filled 2023-04-27 (×2): qty 1

## 2023-04-27 MED ORDER — ORAL CARE MOUTH RINSE: 15.0000 mL | OROMUCOSAL | Status: DC | PRN

## 2023-04-27 MED ORDER — NICOTINE 14 MG/24HR TD PT24
14.0000 mg | MEDICATED_PATCH | Freq: Every day | TRANSDERMAL | Status: DC
  Administered 2023-04-28: 14 mg via TRANSDERMAL
  Filled 2023-04-27 (×2): qty 1

## 2023-04-27 MED ORDER — AZITHROMYCIN 250 MG PO TABS
500.0000 mg | ORAL_TABLET | Freq: Every day | ORAL | Status: DC
  Administered 2023-04-27 – 2023-04-28 (×2): 500 mg via ORAL
  Filled 2023-04-27 (×2): qty 2

## 2023-04-27 MED ORDER — FLUTICASONE PROPIONATE 50 MCG/ACT NA SUSP
2.0000 | Freq: Every day | NASAL | Status: DC
  Administered 2023-04-27 – 2023-04-28 (×2): 2 via NASAL
  Filled 2023-04-27: qty 16

## 2023-04-27 MED ORDER — IPRATROPIUM-ALBUTEROL 0.5-2.5 (3) MG/3ML IN SOLN
3.0000 mL | Freq: Once | RESPIRATORY_TRACT | Status: AC
  Administered 2023-04-27: 3 mL via RESPIRATORY_TRACT
  Filled 2023-04-27: qty 3

## 2023-04-27 NOTE — Progress Notes (Signed)
 Patient was administered albuterol treatment as ordered with relief . RT at the bedside to reassess . No further c/o any discomfort at this time, all safety measures are being maintained.

## 2023-04-27 NOTE — Progress Notes (Signed)
 Anthoney Harada NP was notified as patient requested a laxative. The order was placed and the laxative was administered.

## 2023-04-27 NOTE — Progress Notes (Signed)
 PROGRESS NOTE    Frank [name]  FMW:[id number] DOB: 03-Jan-1971 DOA: 04/26/2023 PCP: Patient, No Pcp Per  Outpatient Specialists:     Brief Narrative:  Patient is a 53 year old African-American male with past medical history significant for continuous tobacco abuse, asthma, and chronic bronchitis.  Patient was admitted with cough and shortness of breath.  Patient is currently on 3.5 L/min of supplemental oxygen.  04/27/2023: Patient seen.  Patient was not on supplemental oxygen at home.  Patient is currently on 3.5 L of supplemental oxygen per minute.  Patient reports what seems to be postnasal drip symptoms as well.  Will add Flonase  and loratadine .  Will add Singulair .  Briefly every day.  History of asthma and chronic bronchitis noted.  Likely discharge in the next 1 to 2 days.   Assessment & Plan:   Principal Problem:   COPD with acute exacerbation (HCC) Active Problems:   COPD with exacerbation (HCC)   COPD exacerbation versus acute exacerbation of asthma: -Continue IV Solu-Medrol  and nebulizer treatment. -Add Singulair  10 Mg p.o. once daily. -Add loratadine  10 Mg p.o. once daily. -Flonase  2 sprays to each nostril once daily. -Peak flow daily. -Incentive spirometry/flutter valve device -Keep oxygen greater than 91%. -Slowly wean patient off supplemental oxygen. -Azithromycin  500 Mg p.o. once daily. -Patient has been counseled to quit cigarette smoking.  Acute hypoxic respiratory failure: -See above documentation. -Patient is currently on 3 and half liters of supplemental oxygen per minute. -Keep O2 sat greater than 91%. -Gradually wean patient off supplemental oxygen if possible.  Tobacco use disorder: -Continuous. -Counseled to quit.   Leukocytosis: -Patient was on prednisone  prior to presentation -CBC in the morning.   Hypertension: -Blood pressure is reasonably controlled. -Goal blood pressure should be less than 130/80 mmHg.  DVT prophylaxis: Subcutaneous  Lovenox . Code Status: Full code. Family Communication:  Disposition Plan: Home in the next 1 to 2 days.   Consultants:  None  Procedures:  None  Antimicrobials:  Azithromycin  500 Mg p.o. once daily for the next 4 days.   Subjective: Shortness of breath. Cough.  Objective: Vitals:   04/27/23 0632 04/27/23 0845 04/27/23 0924 04/27/23 1310  BP: (!) 155/93 (!) 150/90  135/84  Pulse: (!) 103 89  84  Resp: (!) 25 20  16   Temp: 98 F (36.7 C) 97.6 F (36.4 C)  97.9 F (36.6 C)  TempSrc: Oral Oral  Oral  SpO2: 99% 98% 97% 98%  Weight:      Height:        Intake/Output Summary (Last 24 hours) at 04/27/2023 1408 Last data filed at 04/27/2023 0000 Gross per 24 hour  Intake 2490.29 ml  Output 250 ml  Net 2240.29 ml   Filed Weights   04/26/23 0815  Weight: 63.5 kg    Examination:  General exam: Appears calm and comfortable  Respiratory system: Decreased air entry globally.   Cardiovascular system: S1 & S2 heard. Gastrointestinal system: Abdomen is soft and nontender.  Central nervous system: Alert and oriented. No focal neurological deficits. Extremities: No edema.   Data Reviewed: I have personally reviewed following labs and imaging studies  CBC: Recent Labs  Lab 04/26/23 0850 04/27/23 0445  WBC 18.7* 17.3*  HGB 14.0 12.1*  HCT 40.6 35.2*  MCV 98.8 98.9  PLT 315 322   Basic Metabolic Panel: Recent Labs  Lab 04/26/23 0850 04/26/23 1654 04/27/23 0445  NA 135  --  137  K 4.0  --  4.3  CL 101  --  102  CO2 27  --  28  GLUCOSE 116*  --  157*  BUN 14  --  16  CREATININE 1.02  --  1.03  CALCIUM  8.8*  --  9.1  MG  --  2.1  --    GFR: Estimated Creatinine Clearance: 73.6 mL/min (by C-G formula based on SCr of 1.03 mg/dL). Liver Function Tests: Recent Labs  Lab 04/26/23 0850  AST 29  ALT 22  ALKPHOS 71  BILITOT 0.7  PROT 7.4  ALBUMIN 3.5   No results for input(s): LIPASE, AMYLASE in the last 168 hours. No results for input(s): AMMONIA  in the last 168 hours. Coagulation Profile: No results for input(s): INR, PROTIME in the last 168 hours. Cardiac Enzymes: No results for input(s): CKTOTAL, CKMB, CKMBINDEX, TROPONINI in the last 168 hours. BNP (last 3 results) No results for input(s): PROBNP in the last 8760 hours. HbA1C: No results for input(s): HGBA1C in the last 72 hours. CBG: No results for input(s): GLUCAP in the last 168 hours. Lipid Profile: No results for input(s): CHOL, HDL, LDLCALC, TRIG, CHOLHDL, LDLDIRECT in the last 72 hours. Thyroid Function Tests: No results for input(s): TSH, T4TOTAL, FREET4, T3FREE, THYROIDAB in the last 72 hours. Anemia Panel: No results for input(s): VITAMINB12, FOLATE, FERRITIN, TIBC, IRON, RETICCTPCT in the last 72 hours. Urine analysis:    Component Value Date/Time   COLORURINE YELLOW 07/22/2022 0143   APPEARANCEUR CLEAR 07/22/2022 0143   LABSPEC 1.014 07/22/2022 0143   PHURINE 5.0 07/22/2022 0143   GLUCOSEU NEGATIVE 07/22/2022 0143   HGBUR SMALL (A) 07/22/2022 0143   BILIRUBINUR NEGATIVE 07/22/2022 0143   KETONESUR NEGATIVE 07/22/2022 0143   PROTEINUR NEGATIVE 07/22/2022 0143   NITRITE NEGATIVE 07/22/2022 0143   LEUKOCYTESUR NEGATIVE 07/22/2022 0143   Sepsis Labs: @LABRCNTIP (procalcitonin:4,lacticidven:4)  ) Recent Results (from the past 240 hours)  Resp panel by RT-PCR (RSV, Flu A&B, Covid) Anterior Nasal Swab     Status: None   Collection Time: 04/26/23  8:27 AM   Specimen: Anterior Nasal Swab  Result Value Ref Range Status   SARS Coronavirus 2 by RT PCR NEGATIVE NEGATIVE Final    Comment: (NOTE) SARS-CoV-2 target nucleic acids are NOT DETECTED.  The SARS-CoV-2 RNA is generally detectable in upper respiratory specimens during the acute phase of infection. The lowest concentration of SARS-CoV-2 viral copies this assay can detect is 138 copies/mL. A negative result does not preclude SARS-Cov-2 infection and  should not be used as the sole basis for treatment or other patient management decisions. A negative result may occur with  improper specimen collection/handling, submission of specimen other than nasopharyngeal swab, presence of viral mutation(s) within the areas targeted by this assay, and inadequate number of viral copies(<138 copies/mL). A negative result must be combined with clinical observations, patient history, and epidemiological information. The expected result is Negative.  Fact Sheet for Patients:  bloggercourse.com  Fact Sheet for Healthcare Providers:  seriousbroker.it  This test is no t yet approved or cleared by the [place]  FDA and  has been authorized for detection and/or diagnosis of SARS-CoV-2 by FDA under an Emergency Use Authorization (EUA). This EUA will remain  in effect (meaning this test can be used) for the duration of the COVID-19 declaration under Section 564(b)(1) of the Act, 21 U.S.C.section 360bbb-3(b)(1), unless the authorization is terminated  or revoked sooner.       Influenza A by PCR NEGATIVE NEGATIVE Final   Influenza B by PCR NEGATIVE NEGATIVE Final  Comment: (NOTE) The Xpert Xpress SARS-CoV-2/FLU/RSV plus assay is intended as an aid in the diagnosis of influenza from Nasopharyngeal swab specimens and should not be used as a sole basis for treatment. Nasal washings and aspirates are unacceptable for Xpert Xpress SARS-CoV-2/FLU/RSV testing.  Fact Sheet for Patients: bloggercourse.com  Fact Sheet for Healthcare Providers: seriousbroker.it  This test is not yet approved or cleared by the [place]  FDA and has been authorized for detection and/or diagnosis of SARS-CoV-2 by FDA under an Emergency Use Authorization (EUA). This EUA will remain in effect (meaning this test can be used) for the duration of the COVID-19 declaration  under Section 564(b)(1) of the Act, 21 U.S.C. section 360bbb-3(b)(1), unless the authorization is terminated or revoked.     Resp Syncytial Virus by PCR NEGATIVE NEGATIVE Final    Comment: (NOTE) Fact Sheet for Patients: bloggercourse.com  Fact Sheet for Healthcare Providers: seriousbroker.it  This test is not yet approved or cleared by the [place]  FDA and has been authorized for detection and/or diagnosis of SARS-CoV-2 by FDA under an Emergency Use Authorization (EUA). This EUA will remain in effect (meaning this test can be used) for the duration of the COVID-19 declaration under Section 564(b)(1) of the Act, 21 U.S.C. section 360bbb-3(b)(1), unless the authorization is terminated or revoked.  Performed at [facility], [address], [place], [place] [address]   Respiratory (~20 pathogens) panel by PCR     Status: None   Collection Time: 04/26/23  8:27 AM   Specimen: Nasopharyngeal Swab; Respiratory  Result Value Ref Range Status   Adenovirus NOT DETECTED NOT DETECTED Final   Coronavirus 229E NOT DETECTED NOT DETECTED Final    Comment: (NOTE) The Coronavirus on the Respiratory Panel, DOES NOT test for the novel  Coronavirus (2019 nCoV)    Coronavirus HKU1 NOT DETECTED NOT DETECTED Final   Coronavirus NL63 NOT DETECTED NOT DETECTED Final   Coronavirus OC43 NOT DETECTED NOT DETECTED Final   Metapneumovirus NOT DETECTED NOT DETECTED Final   Rhinovirus / Enterovirus NOT DETECTED NOT DETECTED Final   Influenza A NOT DETECTED NOT DETECTED Final   Influenza B NOT DETECTED NOT DETECTED Final   Parainfluenza Virus 1 NOT DETECTED NOT DETECTED Final   Parainfluenza Virus 2 NOT DETECTED NOT DETECTED Final   Parainfluenza Virus 3 NOT DETECTED NOT DETECTED Final   Parainfluenza Virus 4 NOT DETECTED NOT DETECTED Final   Respiratory Syncytial Virus NOT DETECTED NOT DETECTED Final   Bordetella pertussis NOT  DETECTED NOT DETECTED Final   Bordetella Parapertussis NOT DETECTED NOT DETECTED Final   Chlamydophila pneumoniae NOT DETECTED NOT DETECTED Final   Mycoplasma pneumoniae NOT DETECTED NOT DETECTED Final    Comment: Performed at [facility], [address], [place], [place] [address]         Radiology Studies: DG Chest Port 1 View Result Date: 04/26/2023 CLINICAL DATA:  Pain after an injury, shortness of breath for 2 weeks. EXAM: PORTABLE CHEST 1 VIEW COMPARISON:  Chest radiograph dated 02/09/2023. FINDINGS: The heart size and mediastinal contours are within normal limits. Both lungs are clear. The visualized skeletal structures are unremarkable. IMPRESSION: No active disease. Electronically Signed   By: Norman Hopper M.D.   On: 04/26/2023 09:23   DG Foot 2 Views Right Result Date: 04/26/2023 CLINICAL DATA:  Pain after injury. Right foot swelling after kicking a tree. EXAM: RIGHT FOOT - 2 VIEW COMPARISON:  None Available. FINDINGS: There is no evidence of fracture or dislocation.  There is no evidence of arthropathy or other focal bone abnormality. Soft tissues are unremarkable. IMPRESSION: Negative. Electronically Signed   By: Dorn Roulette M.D.   On: 04/26/2023 09:22        Scheduled Meds:  enoxaparin  (LOVENOX ) injection  40 mg Subcutaneous Q24H   fluticasone   2 spray Each Nare Daily   ipratropium-albuterol   3 mL Nebulization TID   loratadine   10 mg Oral Daily   methylPREDNISolone  (SOLU-MEDROL ) injection  40 mg Intravenous Q12H   montelukast   10 mg Oral QHS   Continuous Infusions:   LOS: 0 days    Time spent: 55 Minutes.    Leatrice Chapel, MD  [facility] Pager #: [phone] 7PM-7AM contact night coverage as above

## 2023-04-27 NOTE — Plan of Care (Signed)

## 2023-04-27 NOTE — Progress Notes (Signed)
 Patient c/o sob and chest tightness with coughing. O2 sat 99% on o2 @ 2liters via nasal cannula. Patient requesting prn breathing treatment. Notified Tamsen Horns , NP orders entered to administer albuterol  neb treatment now. Notified RT , who verbalizes will come to patient's bedside to reassess and administer albuterol  treatment. Bed in lowest position, side rails up x 2, call bell within reach, bed alarm on.

## 2023-04-27 NOTE — Progress Notes (Signed)
 Per md    do not  place pt on tele if he can come under 2 liters 02,  if he can keep sats above 92-   verballyaske dby md ogbata to monitor 02 sats and keep md updated and will decide if pt move or remains on med surg/ or tele

## 2023-04-27 NOTE — Progress Notes (Signed)
   04/27/23 0504  Assess: MEWS Score  BP (!) 156/89  MAP (mmHg) 107  Pulse Rate 99  Resp (!) 21  Level of Consciousness Alert  SpO2 96 %   Patient c/o sob and chest tightness with coughing and exertion. O2 sat 96% on o2 @ 2 liters via nasal cannula. Albuterol  breathing treatment administered with relief. Patient has productive cough with yellow tinged thick sputum. Patient educated about pursed lip breathing and to please call for assistance before getting oob. Patient verbalizes an understanding, all safety measures are being maintained at this time.

## 2023-04-27 NOTE — Progress Notes (Signed)
 Pt was able to perform a peak flow of 210 L, RT took the best of 3 attempts. Pt gave good effort.

## 2023-04-27 NOTE — Progress Notes (Signed)
 Anthoney Harada NP states patients isolation status can be removed as Covid, Flu and RSV were negative.

## 2023-04-27 NOTE — Progress Notes (Addendum)
 Pt calm in bed watching television and intermittently singing,   weaning of o2 has begun pt currently at 2 liters [place] sats AT 99%  no increased work of breathing or distress noted. Pt tolerating decrease well, decrease of o2 requested by md ogbata.  Safety measures in place  handoff report completed at bedside with Jaritou Rn

## 2023-04-27 NOTE — Progress Notes (Addendum)
 Antibiotic (Azithromycin ) hanging without pump; dripping in. Asked supervising RN for a pump due to the Cardiotoxicity and QT Elongation that can occur with Azithromycin  infusions. The Night House Supervisor (Misha RN, MSN) stated a pump is un-necessary for antibiotics.  Thankfully, an alternate RN Baird) provided a pump. The remainder of the Azithromycin  was infused using the pump.

## 2023-04-28 DIAGNOSIS — J441 Chronic obstructive pulmonary disease with (acute) exacerbation: Secondary | ICD-10-CM | POA: Diagnosis not present

## 2023-04-28 LAB — CBC WITH DIFFERENTIAL/PLATELET
Abs Immature Granulocytes: 0.15 10*3/uL — ABNORMAL HIGH (ref 0.00–0.07)
Basophils Absolute: 0 10*3/uL (ref 0.0–0.1)
Basophils Relative: 0 %
Eosinophils Absolute: 0 10*3/uL (ref 0.0–0.5)
Eosinophils Relative: 0 %
HCT: 35.4 % — ABNORMAL LOW (ref 39.0–52.0)
Hemoglobin: 12.3 g/dL — ABNORMAL LOW (ref 13.0–17.0)
Immature Granulocytes: 1 %
Lymphocytes Relative: 5 %
Lymphs Abs: 1 10*3/uL (ref 0.7–4.0)
MCH: 34.7 pg — ABNORMAL HIGH (ref 26.0–34.0)
MCHC: 34.7 g/dL (ref 30.0–36.0)
MCV: 100 fL (ref 80.0–100.0)
Monocytes Absolute: 0.6 10*3/uL (ref 0.1–1.0)
Monocytes Relative: 3 %
Neutro Abs: 18 10*3/uL — ABNORMAL HIGH (ref 1.7–7.7)
Neutrophils Relative %: 91 %
Platelets: 358 10*3/uL (ref 150–400)
RBC: 3.54 MIL/uL — ABNORMAL LOW (ref 4.22–5.81)
RDW: 12.9 % (ref 11.5–15.5)
WBC: 19.8 10*3/uL — ABNORMAL HIGH (ref 4.0–10.5)
nRBC: 0 % (ref 0.0–0.2)

## 2023-04-28 MED ORDER — LORATADINE 10 MG PO TABS: 10.0000 mg | ORAL_TABLET | Freq: Every day | ORAL | 0 refills | Status: AC

## 2023-04-28 MED ORDER — IPRATROPIUM-ALBUTEROL 0.5-2.5 (3) MG/3ML IN SOLN: 3.0000 mL | Freq: Three times a day (TID) | RESPIRATORY_TRACT | 0 refills | Status: AC

## 2023-04-28 MED ORDER — AZITHROMYCIN 250 MG PO TABS: 500.0000 mg | ORAL_TABLET | Freq: Every day | ORAL | 0 refills | Status: AC

## 2023-04-28 MED ORDER — FLUTICASONE PROPIONATE 50 MCG/ACT NA SUSP: 2.0000 | Freq: Every day | NASAL | 0 refills | Status: AC

## 2023-04-28 MED ORDER — PREDNISONE 50 MG PO TABS: 50.0000 mg | ORAL_TABLET | Freq: Every day | ORAL | 0 refills | Status: AC

## 2023-04-28 MED ORDER — NICOTINE 14 MG/24HR TD PT24: 14.0000 mg | MEDICATED_PATCH | Freq: Every day | TRANSDERMAL | 0 refills | Status: AC

## 2023-04-28 NOTE — Discharge Summary (Signed)
 [name] FMW:[id number] DOB: [birth year] DOA: 04/26/2023  PCP: Patient, No Pcp Per  Admit date: 04/26/2023  Discharge date: 04/28/2023  Admitted From: Home   disposition: Home  Recommendations for Outpatient Follow-up:   Follow up with PCP in 1 week  Home Health: N/A Equipment/Devices: Nebulizer Consultations: None Discharge Condition: Improved CODE STATUS: Full   Diet Order             Diet - low sodium heart healthy           Diet regular Room service appropriate? Yes; Fluid consistency: Thin  Diet effective now                    Chief Complaint  Patient presents with   Shortness of Breath     Brief history of present illness from the day of admission and additional interim summary      Frank [name] is a 53 y.o. male with medical history significant for tobacco abuse, asthma, chronic bronchitis being admitted to the hospital with acute exacerbation of asthma in the setting of 2 weeks of cough and dyspnea.  Patient is not a very good historian, seems to be a little bit self contradictory, but what I am able to gather is that he went to visit his daughter for the past 3 weeks for the Christmas holidays.  His daughter and grandson who are sick, once he was there he started to also develop a cough.  He had some chest tightness with cough, denies any fevers, in the last week he also started to produce some green sputum.  Apparently he has some prednisone  tablets at home from previous ER visits, told me that he has been taking prednisone  intermittently over the last 2 or 3 weeks.  However when asked when he last took prednisone , states that he has not taken any in the last 3 weeks.  He was brought in by EMS due to shortness of breath, he received 2 DuoNebs prior to arrival.  He also had an x-ray of the  right foot since he kicked a tree, was having some pain and swelling.  In the ER he was given 125 mg Solu-Medrol , IV azithromycin  and IV Rocephin .                                                                   Hospital Course   Patient was treated for COPD exacerbation/exacerbation of asthma with IV steroids and inhaled bronchodilators.  Patient responded well with improvement in respiratory status.  Patient found inhaled bronchodilators to be extremely useful and requested nebulizer machine to be discharged home with.  On day of discharge patient was able to ambulate in the hallways off of oxygen.  She was discharged home on oral steroids to complete  a 5-day course.  She was given a nebulizer machine and instructed on use.  Patient is to follow-up with her PCP within 5 to 7 days to ensure that she is still going well.  She was counseled to stop smoking and she said she was going to try.    Discharge diagnosis     Principal Problem:   COPD with acute exacerbation (HCC) Active Problems:   COPD with exacerbation [facility])    Discharge instructions    Discharge Instructions     Diet - low sodium heart healthy   Complete by: As directed    Discharge instructions   Complete by: As directed    Take a breathing treatment 3 times a day for the next 5 days. After that you can use it if you need it. It is great that you stopped smoking. Do not smoke at all as this will damage your lungs. See you primary doctor in 7-10 days to make sure you are still doing well.       Discharge Medications   Allergies as of 04/28/2023       Reactions   Asa [aspirin]    Banana    Lydia Pinkham [germanium] Other (See Comments)   arthritis   Other Hives, Itching   Allergic to white chocolate Allergic to melons   Pineapple    Tylenol  [acetaminophen ] Nausea And Vomiting        Medication List     TAKE these medications    albuterol  108 (90 Base) MCG/ACT inhaler Commonly known as: VENTOLIN   HFA Inhale 2 puffs into the lungs every 4 (four) hours while awake for 2 days, THEN 2 puffs every 4 (four) hours as needed for wheezing or shortness of breath. Start taking on: December 07, 2022   amLODipine  10 MG tablet Commonly known as: NORVASC  Take 1 tablet (10 mg total) by mouth daily.   azithromycin  250 MG tablet Commonly known as: ZITHROMAX  Take 2 tablets (500 mg total) by mouth daily for 3 days.   fluticasone  50 MCG/ACT nasal spray Commonly known as: FLONASE  Place 2 sprays into both nostrils daily. Start taking on: April 29, 2023   ipratropium-albuterol  0.5-2.5 (3) MG/3ML Soln Commonly known as: DUONEB Take 3 mLs by nebulization 3 (three) times daily.   loratadine  10 MG tablet Commonly known as: CLARITIN  Take 1 tablet (10 mg total) by mouth daily. Start taking on: April 29, 2023   nicotine  14 mg/24hr patch Commonly known as: NICODERM CQ  - dosed in mg/24 hours Place 1 patch (14 mg total) onto the skin daily. Start taking on: April 29, 2023   predniSONE  50 MG tablet Commonly known as: DELTASONE  Take 1 tablet (50 mg total) by mouth daily for 5 days.               Durable Medical Equipment  (From admission, onward)           Start     Ordered   04/28/23 1422  For home use only DME Nebulizer machine  Once       Question Answer Comment  Patient needs a nebulizer to treat with the following condition COPD (chronic obstructive pulmonary disease) (HCC)   Length of Need 6 Months   Additional equipment included Administration kit      04/28/23 1422             Follow-up Information     [facility] Follow up.   Contact information: [address] [place],  [place] [address] [phone]        [facility] Follow up.   Contact information: [address] [place], [place], [address] [phone]                Major procedures and Radiology Reports - PLEASE review detailed and final reports thoroughly  -        DG Chest Port 1 View Result Date: 04/26/2023 CLINICAL DATA:  Pain after an injury, shortness of breath for 2 weeks. EXAM: PORTABLE CHEST 1 VIEW COMPARISON:  Chest radiograph dated 02/09/2023. FINDINGS: The heart size and mediastinal contours are within normal limits. Both lungs are clear. The visualized skeletal structures are unremarkable. IMPRESSION: No active disease. Electronically Signed   By: Norman Hopper M.D.   On: 04/26/2023 09:23   DG Foot 2 Views Right Result Date: 04/26/2023 CLINICAL DATA:  Pain after injury. Right foot swelling after kicking a tree. EXAM: RIGHT FOOT - 2 VIEW COMPARISON:  None Available. FINDINGS: There is no evidence of fracture or dislocation. There is no evidence of arthropathy or other focal bone abnormality. Soft tissues are unremarkable. IMPRESSION: Negative. Electronically Signed   By: Dorn Roulette M.D.   On: 04/26/2023 09:22    Micro Results    Recent Results (from the past 240 hours)  Resp panel by RT-PCR (RSV, Flu A&B, Covid) Anterior Nasal Swab     Status: None   Collection Time: 04/26/23  8:27 AM   Specimen: Anterior Nasal Swab  Result Value Ref Range Status   SARS Coronavirus 2 by RT PCR NEGATIVE NEGATIVE Final    Comment: (NOTE) SARS-CoV-2 target nucleic acids are NOT DETECTED.  The SARS-CoV-2 RNA is generally detectable in upper respiratory specimens during the acute phase of infection. The lowest concentration of SARS-CoV-2 viral copies this assay can detect is 138 copies/mL. A negative result does not preclude SARS-Cov-2 infection and should not be used as the sole basis for treatment or other patient management decisions. A negative result may occur with  improper specimen collection/handling, submission of specimen other than nasopharyngeal swab, presence of viral mutation(s) within the areas targeted by this assay, and inadequate number of viral copies(<138 copies/mL). A negative result must be combined with clinical  observations, patient history, and epidemiological information. The expected result is Negative.  Fact Sheet for Patients:  bloggercourse.com  Fact Sheet for Healthcare Providers:  seriousbroker.it  This test is no t yet approved or cleared by the [place]  FDA and  has been authorized for detection and/or diagnosis of SARS-CoV-2 by FDA under an Emergency Use Authorization (EUA). This EUA will remain  in effect (meaning this test can be used) for the duration of the COVID-19 declaration under Section 564(b)(1) of the Act, 21 U.S.C.section 360bbb-3(b)(1), unless the authorization is terminated  or revoked sooner.       Influenza A by PCR NEGATIVE NEGATIVE Final   Influenza B by PCR NEGATIVE NEGATIVE Final    Comment: (NOTE) The Xpert Xpress SARS-CoV-2/FLU/RSV plus assay is intended as an aid in the diagnosis of influenza from Nasopharyngeal swab specimens and should not be used as a sole basis for treatment. Nasal washings and aspirates are unacceptable for Xpert Xpress SARS-CoV-2/FLU/RSV testing.  Fact Sheet for Patients: bloggercourse.com  Fact Sheet for Healthcare Providers: seriousbroker.it  This test is not yet approved or cleared by the [place]  FDA and has been authorized for detection and/or diagnosis of SARS-CoV-2 by FDA under an Emergency Use Authorization (EUA). This EUA will remain in  effect (meaning this test can be used) for the duration of the COVID-19 declaration under Section 564(b)(1) of the Act, 21 U.S.C. section 360bbb-3(b)(1), unless the authorization is terminated or revoked.     Resp Syncytial Virus by PCR NEGATIVE NEGATIVE Final    Comment: (NOTE) Fact Sheet for Patients: bloggercourse.com  Fact Sheet for Healthcare Providers: seriousbroker.it  This test is not yet approved or cleared by  the [place]  FDA and has been authorized for detection and/or diagnosis of SARS-CoV-2 by FDA under an Emergency Use Authorization (EUA). This EUA will remain in effect (meaning this test can be used) for the duration of the COVID-19 declaration under Section 564(b)(1) of the Act, 21 U.S.C. section 360bbb-3(b)(1), unless the authorization is terminated or revoked.  Performed at [facility], [address] [place], [place] [address]   Respiratory (~20 pathogens) panel by PCR     Status: None   Collection Time: 04/26/23  8:27 AM   Specimen: Nasopharyngeal Swab; Respiratory  Result Value Ref Range Status   Adenovirus NOT DETECTED NOT DETECTED Final   Coronavirus 229E NOT DETECTED NOT DETECTED Final    Comment: (NOTE) The Coronavirus on the Respiratory Panel, DOES NOT test for the novel  Coronavirus (2019 nCoV)    Coronavirus HKU1 NOT DETECTED NOT DETECTED Final   Coronavirus NL63 NOT DETECTED NOT DETECTED Final   Coronavirus OC43 NOT DETECTED NOT DETECTED Final   Metapneumovirus NOT DETECTED NOT DETECTED Final   Rhinovirus / Enterovirus NOT DETECTED NOT DETECTED Final   Influenza A NOT DETECTED NOT DETECTED Final   Influenza B NOT DETECTED NOT DETECTED Final   Parainfluenza Virus 1 NOT DETECTED NOT DETECTED Final   Parainfluenza Virus 2 NOT DETECTED NOT DETECTED Final   Parainfluenza Virus 3 NOT DETECTED NOT DETECTED Final   Parainfluenza Virus 4 NOT DETECTED NOT DETECTED Final   Respiratory Syncytial Virus NOT DETECTED NOT DETECTED Final   Bordetella pertussis NOT DETECTED NOT DETECTED Final   Bordetella Parapertussis NOT DETECTED NOT DETECTED Final   Chlamydophila pneumoniae NOT DETECTED NOT DETECTED Final   Mycoplasma pneumoniae NOT DETECTED NOT DETECTED Final    Comment: Performed at [facility], [address] [place], [place] [address]    Today   Subjective    Frank [name] feels much improved since admission.  Feels ready to go home.   Denies chest pain, shortness of breath or abdominal pain.  Feels they can take care of themselves with the resources they have at home.  Objective   Blood pressure (!) 158/93, pulse 90, temperature 98.5 F (36.9 C), temperature source Oral, resp. rate 16, height 5' 8 (1.727 m), weight 63.5 kg, SpO2 97%.   Intake/Output Summary (Last 24 hours) at 04/28/2023 1426 Last data filed at 04/28/2023 0800 Gross per 24 hour  Intake 550 ml  Output 1325 ml  Net -775 ml    Exam General: Patient appears well and in good spirits sitting up in bed in no acute distress.  Eyes: sclera anicteric, conjuctiva mild injection bilaterally CVS: S1-S2, regular  Respiratory:  decreased air entry bilaterally secondary to decreased inspiratory effort, rales at bases  GI: NABS, soft, NT  LE: No edema.  Neuro: A/O x 3, Moving all extremities equally with normal strength, CN 3-12 intact, grossly nonfocal.  Psych: patient is logical and coherent, judgement and insight appear normal, mood and affect appropriate to situation.    Data Review   CBC w Diff:  Lab Results  Component Value  Date   WBC 19.8 (H) 04/28/2023   HGB 12.3 (L) 04/28/2023   HCT 35.4 (L) 04/28/2023   PLT 358 04/28/2023   LYMPHOPCT 5 04/28/2023   MONOPCT 3 04/28/2023   EOSPCT 0 04/28/2023   BASOPCT 0 04/28/2023    CMP:  Lab Results  Component Value Date   NA 137 04/27/2023   K 4.3 04/27/2023   CL 102 04/27/2023   CO2 28 04/27/2023   BUN 16 04/27/2023   CREATININE 1.03 04/27/2023   PROT 7.4 04/26/2023   ALBUMIN 3.5 04/26/2023   BILITOT 0.7 04/26/2023   ALKPHOS 71 04/26/2023   AST 29 04/26/2023   ALT 22 04/26/2023  .   Total Time in preparing paper work, data evaluation and todays exam - 35 minutes  Makell Cyr Tublu Hazelle Woollard M.D on 04/28/2023 at 2:26 PM  [facility]

## 2023-04-28 NOTE — TOC Transition Note (Signed)
 Transition of Care [facility]) - Discharge Note  Patient Details  Name: Frank [name] MRN: [id number] Date of Birth: 04-30-1970  Transition of Care [facility]) CM/SW Contact:  Duwaine GORMAN Aran, LCSW Phone Number: 04/28/2023, 3:12 PM  Clinical Narrative: Patient will need a nebulizer at discharge. Patient notified nebulizer will be delivered to his room. CSW made referral to Zack with [employer]. [employer] to deliver nebulizer to his room. TOC signing off.  Final next level of care: Other (comment) Barriers to Discharge: Barriers Resolved  Patient Goals and CMS Choice Patient states their goals for this hospitalization and ongoing recovery are:: Get better Choice offered to / list presented to : NA  Discharge Plan and Services Additional resources added to the After Visit Summary for   In-house Referral: Clinical Social Work Post Acute Care Choice: NA          DME Arranged: Community education officer DME Agency: [employer] Date DME Agency Contacted: 04/28/23 Time DME Agency Contacted: [phone] Representative spoke with at DME Agency: Zack  Social Drivers of Health (SDOH) Interventions SDOH Screenings   Food Insecurity: Food Insecurity Present (04/26/2023)  Housing: High Risk (04/26/2023)  Transportation Needs: No Transportation Needs (04/26/2023)  Utilities: Not At Risk (04/26/2023)  Social Connections: Unknown (04/26/2023)  Tobacco Use: High Risk (04/26/2023)   Readmission Risk Interventions    04/28/2023    8:55 AM  Readmission Risk Prevention Plan  Transportation Screening Complete  HRI or Home Care Consult Complete  Social Work Consult for Recovery Care Planning/Counseling Complete  Palliative Care Screening Not Applicable  Medication Review Oceanographer) Complete

## 2023-04-28 NOTE — TOC Initial Note (Signed)
 Transition of Care [facility]) - Initial/Assessment Note   Patient Details  Name: Frank [name] MRN: [id number] Date of Birth: Sep 17, 1970  Transition of Care [facility]) CM/SW Contact:    Duwaine GORMAN Aran, LCSW Phone Number: 04/28/2023, 8:58 AM  Clinical Narrative: TOC consulted for homelessness. CSW spoke with patient regarding consult and going to the [facility] after discharge. Patient declined shelter resources and going to the [facility] stating, I'm not going through that...people be getting shot up over there. Food pantry resources added to AVS.  Expected Discharge Plan: Homeless Shelter Barriers to Discharge: Continued Medical Work up  Patient Goals and CMS Choice Patient states their goals for this hospitalization and ongoing recovery are:: Get better Choice offered to / list presented to : NA  Expected Discharge Plan and Services In-house Referral: Clinical Social Work Post Acute Care Choice: NA Living arrangements for the past 2 months: Homeless             DME Arranged: N/A DME Agency: NA  Prior Living Arrangements/Services Living arrangements for the past 2 months: Homeless Lives with:: Self Patient language and need for interpreter reviewed:: Yes Need for Family Participation in Patient Care: No (Comment) Care giver support system in place?: Yes (comment) Criminal Activity/Legal Involvement Pertinent to Current Situation/Hospitalization: No - Comment as needed  Activities of Daily Living ADL Screening (condition at time of admission) Independently performs ADLs?: Yes (appropriate for developmental age) Is the patient deaf or have difficulty hearing?: No Does the patient have difficulty seeing, even when wearing glasses/contacts?: No Does the patient have difficulty concentrating, remembering, or making decisions?: No  Emotional Assessment Attitude/Demeanor/Rapport: Engaged Affect (typically observed): Agitated Orientation: : Oriented to Self, Oriented to Place, Oriented to  Time,  Oriented to Situation Psych Involvement: No (comment)  Admission diagnosis:  COPD exacerbation (HCC) [J44.1] COPD with acute exacerbation (HCC) [J44.1] COPD with exacerbation (HCC) [J44.1] Patient Active Problem List   Diagnosis Date Noted   COPD with exacerbation (HCC) 04/27/2023   COPD with acute exacerbation (HCC) 04/26/2023   Asthma exacerbation 12/08/2022   Acute respiratory failure with hypoxia (HCC) 12/06/2022   Hyperglycemia 12/06/2022   Hypertension 12/06/2022   Traumatic rhabdomyolysis (HCC)    Elevated troponin    AKI (acute kidney injury) (HCC) 01/26/2021   Elevated LFTs 01/26/2021   Alcohol abuse 01/26/2021   Polysubstance abuse (HCC) 01/26/2021   Diarrhea 01/26/2021   PCP:  Patient, No Pcp Per Pharmacy:   [facility] [phone] - [place], [place] - [address] AT [facility] [address] [place] [address] Phone: [phone] Fax: [phone]  [id number] [place], [place] - [address] AT [facility] OF [facility] [address] [place] [address] Phone: [phone] Fax: [phone]  [facility] - [place], [place] - [address] [place] [address] Phone: [phone] Fax: [phone]  [facility] [address] [place] [address] Phone: [phone] Fax: [phone]  Social Drivers of Health (SDOH) Social History: SDOH Screenings   Food Insecurity: Food Insecurity Present (04/26/2023)  Housing: High Risk (04/26/2023)  Transportation Needs: No Transportation Needs (04/26/2023)  Utilities: Not At Risk (04/26/2023)  Social Connections: Unknown (04/26/2023)  Tobacco Use: High Risk (04/26/2023)   SDOH Interventions: Food Insecurity Interventions: Other (Comment), Inpatient TOC (Food pantry resources added to AVS.) Housing Interventions: Inpatient TOC, Patient Declined (Patient declined  homeless shelter and [facility] resources.)  Readmission Risk Interventions    04/28/2023    8:55 AM  Readmission Risk Prevention Plan  Transportation Screening Complete  HRI or Home Care Consult Complete  Social Work Consult for Recovery Care Planning/Counseling Complete  Palliative Care Screening Not Applicable  Medication Review Oceanographer) Complete

## 2023-08-22 ENCOUNTER — Emergency Department (HOSPITAL_COMMUNITY)
Admission: EM | Admit: 2023-08-22 | Discharge: 2023-08-22 | Disposition: A | Discharge status: Home / Self Care | Attending: Emergency Medicine | Admitting: Emergency Medicine

## 2023-08-22 ENCOUNTER — Encounter (HOSPITAL_COMMUNITY): Payer: Self-pay | Admitting: Emergency Medicine

## 2023-08-22 ENCOUNTER — Other Ambulatory Visit: Payer: Self-pay

## 2023-08-22 DIAGNOSIS — Z5900 Homelessness unspecified: Secondary | ICD-10-CM | POA: Insufficient documentation

## 2023-08-22 DIAGNOSIS — R531 Weakness: Secondary | ICD-10-CM | POA: Diagnosis present

## 2023-08-22 HISTORY — DX: Asymptomatic human immunodeficiency virus (hiv) infection status: Z21

## 2023-08-22 HISTORY — DX: Unilateral inguinal hernia, without obstruction or gangrene, not specified as recurrent: K40.90

## 2023-08-22 LAB — CBC WITH DIFFERENTIAL/PLATELET
Abs Immature Granulocytes: 0.02 10*3/uL (ref 0.00–0.07)
Basophils Absolute: 0 10*3/uL (ref 0.0–0.1)
Basophils Relative: 1 %
Eosinophils Absolute: 0.2 10*3/uL (ref 0.0–0.5)
Eosinophils Relative: 3 %
HCT: 39.8 % (ref 39.0–52.0)
Hemoglobin: 13.4 g/dL (ref 13.0–17.0)
Immature Granulocytes: 0 %
Lymphocytes Relative: 34 %
Lymphs Abs: 2.4 10*3/uL (ref 0.7–4.0)
MCH: 33 pg (ref 26.0–34.0)
MCHC: 33.7 g/dL (ref 30.0–36.0)
MCV: 98 fL (ref 80.0–100.0)
Monocytes Absolute: 0.9 10*3/uL (ref 0.1–1.0)
Monocytes Relative: 13 %
Neutro Abs: 3.4 10*3/uL (ref 1.7–7.7)
Neutrophils Relative %: 49 %
Platelets: 303 10*3/uL (ref 150–400)
RBC: 4.06 MIL/uL — ABNORMAL LOW (ref 4.22–5.81)
RDW: 12.7 % (ref 11.5–15.5)
WBC: 7 10*3/uL (ref 4.0–10.5)
nRBC: 0 % (ref 0.0–0.2)

## 2023-08-22 LAB — HEPATITIS PANEL, ACUTE
HCV Ab: NONREACTIVE
Hep A IgM: NONREACTIVE
Hep B C IgM: NONREACTIVE
Hepatitis B Surface Ag: NONREACTIVE

## 2023-08-22 LAB — COMPREHENSIVE METABOLIC PANEL WITH GFR
ALT: 18 U/L (ref 0–44)
AST: 32 U/L (ref 15–41)
Albumin: 3.6 g/dL (ref 3.5–5.0)
Alkaline Phosphatase: 57 U/L (ref 38–126)
Anion gap: 9 (ref 5–15)
BUN: 12 mg/dL (ref 6–20)
CO2: 26 mmol/L (ref 22–32)
Calcium: 8.8 mg/dL — ABNORMAL LOW (ref 8.9–10.3)
Chloride: 102 mmol/L (ref 98–111)
Creatinine, Ser: 1.35 mg/dL — ABNORMAL HIGH (ref 0.61–1.24)
GFR, Estimated: >60 mL/min (ref >60)
Glucose, Bld: 126 mg/dL — ABNORMAL HIGH (ref 70–99)
Potassium: 3.6 mmol/L (ref 3.5–5.1)
Sodium: 137 mmol/L (ref 135–145)
Total Bilirubin: 0.5 mg/dL (ref 0.0–1.2)
Total Protein: 6.3 g/dL — ABNORMAL LOW (ref 6.5–8.1)

## 2023-08-22 LAB — HIV ANTIBODY (ROUTINE TESTING W REFLEX): HIV Screen 4th Generation wRfx: NONREACTIVE

## 2023-08-22 NOTE — ED Provider Notes (Signed)
 [place] EMERGENCY DEPARTMENT AT [facility] Provider Note   CSN: [id number] Arrival date & time: 08/22/23  1432     History  No chief complaint on file.   Frank [name] is a 53 y.o. male.  53 year old male who diagnosed with HIV a year ago but has not had any follow-up presents due to worried about his HIV infection.  States that he has had some weight loss.  Denies any shortness of breath.  No real diarrhea.  Does note some weakness.  Denies any bloody stools.  States he just want to get checked out       Home Medications Prior to Admission medications   Medication Sig Start Date End Date Taking? Authorizing Provider  albuterol  (VENTOLIN  HFA) 108 (90 Base) MCG/ACT inhaler Inhale 2 puffs into the lungs every 4 (four) hours while awake for 2 days, THEN 2 puffs every 4 (four) hours as needed for wheezing or shortness of breath. 12/07/22 04/26/23  Verlyn Goad, MD  amLODipine  (NORVASC ) 10 MG tablet Take 1 tablet (10 mg total) by mouth daily. 12/07/22 04/26/23  Verlyn Goad, MD  fluticasone  (FLONASE ) 50 MCG/ACT nasal spray Place 2 sprays into both nostrils daily. 04/29/23   Chatterjee, Srobona Tublu, MD  ipratropium-albuterol  (DUONEB) 0.5-2.5 (3) MG/3ML SOLN Take 3 mLs by nebulization 3 (three) times daily. 04/28/23   Chatterjee, Srobona Tublu, MD  loratadine  (CLARITIN ) 10 MG tablet Take 1 tablet (10 mg total) by mouth daily. 04/29/23   Chatterjee, Srobona Tublu, MD  nicotine  (NICODERM CQ  - DOSED IN MG/24 HOURS) 14 mg/24hr patch Place 1 patch (14 mg total) onto the skin daily. 04/29/23   Chatterjee, Srobona Tublu, MD      Allergies    Asa [aspirin], Banana, Lydia pinkham [germanium], Other, Pineapple, and Tylenol  [acetaminophen ]    Review of Systems   Review of Systems  All other systems reviewed and are negative.   Physical Exam Updated Vital Signs BP (!) 128/90 (BP Location: Right Arm)   Pulse 96   Temp 98.1 F (36.7 C)   Resp 18   SpO2 98%  Physical Exam Vitals  and nursing note reviewed.  Constitutional:      General: He is not in acute distress.    Appearance: Normal appearance. He is well-developed. He is not toxic-appearing.  HENT:     Head: Normocephalic and atraumatic.  Eyes:     General: Lids are normal.     Conjunctiva/sclera: Conjunctivae normal.     Pupils: Pupils are equal, round, and reactive to light.  Neck:     Thyroid: No thyroid mass.     Trachea: No tracheal deviation.  Cardiovascular:     Rate and Rhythm: Normal rate and regular rhythm.     Heart sounds: Normal heart sounds. No murmur heard.    No gallop.  Pulmonary:     Effort: Pulmonary effort is normal. No respiratory distress.     Breath sounds: Normal breath sounds. No stridor. No decreased breath sounds, wheezing, rhonchi or rales.  Abdominal:     General: There is no distension.     Palpations: Abdomen is soft.     Tenderness: There is no abdominal tenderness. There is no rebound.  Musculoskeletal:        General: No tenderness. Normal range of motion.     Cervical back: Normal range of motion and neck supple.  Skin:    General: Skin is warm and dry.     Findings: No abrasion  or rash.  Neurological:     Mental Status: He is alert and oriented to person, place, and time. Mental status is at baseline.     GCS: GCS eye subscore is 4. GCS verbal subscore is 5. GCS motor subscore is 6.     Cranial Nerves: No cranial nerve deficit.     Sensory: No sensory deficit.     Motor: Motor function is intact.  Psychiatric:        Attention and Perception: Attention normal.        Speech: Speech normal.        Behavior: Behavior normal.     ED Results / Procedures / Treatments   Labs (all labs ordered are listed, but only abnormal results are displayed) Labs Reviewed  HIV ANTIBODY (ROUTINE TESTING W REFLEX)  CBC WITH DIFFERENTIAL/PLATELET  COMPREHENSIVE METABOLIC PANEL WITH GFR  CD4/CD8 (T-HELPER/T-SUPPRESSOR CELL)  RPR  HEPATITIS PANEL, ACUTE  GC/CHLAMYDIA PROBE  AMP ([place]) NOT AT [facility]    EKG None  Radiology No results found.  Procedures Procedures    Medications Ordered in ED Medications - No data to display  ED Course/ Medical Decision Making/ A&P                                 Medical Decision Making  The patient is HIV test was negative.  Hemoglobin stable here.  Labs are significant for possible mild dehydration.  Hepatitis screen is negative.  Will discharge home and give referral to the community wellness center        Final Clinical Impression(s) / ED Diagnoses Final diagnoses:  None    Rx / DC Orders ED Discharge Orders     None         Lind Repine, MD 08/22/23 [phone]

## 2023-08-22 NOTE — ED Notes (Signed)
Shift report received, assumed care of patient at this time 

## 2023-08-22 NOTE — ED Notes (Signed)
 Pt unable to provide urine specimen at this time

## 2023-08-22 NOTE — ED Notes (Signed)
 Patient discharged by [facility] RN

## 2023-08-22 NOTE — ED Triage Notes (Addendum)
 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  Pt has been experiencing weight loss, sweats and neuropathy.  Pt would like a prescription for meds to help treat.  PT also has a hernia in right groin that he would like evaluated.  Pt is homeless.

## 2023-08-22 NOTE — ED Provider Triage Note (Signed)
 Emergency Medicine Provider Triage Evaluation Note  Frank [name] , a 53 y.o. male  was evaluated in triage.  Pt complains of losing weight fatigue and weakness.  Patient reports last July when he was in jail he was tested for HIV and was positive.  Patient reports his partner is HIV positive.  Patient reports that he has begun feeling sick and weak.  He comes to the emergency department today hoping to get on medication for HIV.  Review of Systems  Positive: Poor appetite and weight loss Negative: Fever or recent infection  Physical Exam  BP (!) 128/90 (BP Location: Right Arm)   Pulse 96   Temp 98.1 F (36.7 C)   Resp 18   SpO2 98%  Gen:   Awake,  Resp:  Normal effort  MSK:   Moves extremities without difficulty  Other:    Medical Decision Making  Medically screening exam initiated at 3:06 PM.  Appropriate orders placed.  TAHAJ STAKER was informed that the remainder of the evaluation will be completed by another provider, this initial triage assessment does not replace that evaluation, and the importance of remaining in the ED until their evaluation is complete.     Sandi Crosby, PA-C 08/22/23 [phone]

## 2023-08-23 LAB — CD4/CD8 (T-HELPER/T-SUPPRESSOR CELL)
CD4 absolute: 1468 /uL (ref 400–1790)
CD4%: 63 % (ref 33–65)
CD8 T Cell Abs: 497 /uL (ref 190–1000)
CD8tox: 21.32 % (ref 12–40)
Ratio: 2.95 (ref 1.0–3.0)
Total lymphocyte count: 2331 /uL (ref 1000–4000)

## 2023-08-23 LAB — RPR: RPR Ser Ql: NONREACTIVE

## 2023-10-02 ENCOUNTER — Other Ambulatory Visit: Payer: Self-pay

## 2023-10-02 ENCOUNTER — Encounter (HOSPITAL_COMMUNITY): Payer: Self-pay

## 2023-10-02 ENCOUNTER — Emergency Department (HOSPITAL_COMMUNITY)
Admission: EM | Admit: 2023-10-02 | Discharge: 2023-10-03 | Disposition: A | Discharge status: Home / Self Care | Attending: Emergency Medicine | Admitting: Emergency Medicine

## 2023-10-02 DIAGNOSIS — Z21 Asymptomatic human immunodeficiency virus [HIV] infection status: Secondary | ICD-10-CM | POA: Diagnosis not present

## 2023-10-02 DIAGNOSIS — J45909 Unspecified asthma, uncomplicated: Secondary | ICD-10-CM | POA: Insufficient documentation

## 2023-10-02 DIAGNOSIS — Z760 Encounter for issue of repeat prescription: Secondary | ICD-10-CM | POA: Insufficient documentation

## 2023-10-02 DIAGNOSIS — S022XXA Fracture of nasal bones, initial encounter for closed fracture: Secondary | ICD-10-CM | POA: Insufficient documentation

## 2023-10-02 DIAGNOSIS — S0992XA Unspecified injury of nose, initial encounter: Secondary | ICD-10-CM | POA: Diagnosis present

## 2023-10-02 DIAGNOSIS — L03811 Cellulitis of head [any part, except face]: Secondary | ICD-10-CM | POA: Insufficient documentation

## 2023-10-02 DIAGNOSIS — Z7951 Long term (current) use of inhaled steroids: Secondary | ICD-10-CM | POA: Insufficient documentation

## 2023-10-02 NOTE — ED Triage Notes (Signed)
 Pt states that tiny white bugs have been biting his head x2 days. Pt states that his girlfriend found 7 bites on his scalp that make a circle.

## 2023-10-03 ENCOUNTER — Emergency Department (HOSPITAL_COMMUNITY)

## 2023-10-03 MED ORDER — CEPHALEXIN 250 MG PO CAPS
500.0000 mg | ORAL_CAPSULE | Freq: Once | ORAL | Status: AC
  Administered 2023-10-03: 500 mg via ORAL
  Filled 2023-10-03: qty 2

## 2023-10-03 MED ORDER — CEPHALEXIN 500 MG PO CAPS: 500.0000 mg | ORAL_CAPSULE | Freq: Three times a day (TID) | ORAL | 0 refills | Status: AC

## 2023-10-03 MED ORDER — ALBUTEROL SULFATE HFA 108 (90 BASE) MCG/ACT IN AERS
2.0000 | INHALATION_SPRAY | Freq: Four times a day (QID) | RESPIRATORY_TRACT | Status: DC
  Administered 2023-10-03: 2 via RESPIRATORY_TRACT
  Filled 2023-10-03: qty 6.7
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
## 2023-10-03 NOTE — Discharge Instructions (Signed)
 It was a pleasure taking part in your care.  As discussed, please begin taking Keflex  3 times a day for 7 days for cellulitis to the top of your head.  Please also follow-up with ENT concerning her nasal fracture.  Please avoid straws, do not blow your nose.  Please call number on this form to make an appointment in the morning to be seen.  Return to the ED with any new or worsening symptoms.

## 2023-11-29 ENCOUNTER — Emergency Department (HOSPITAL_COMMUNITY)

## 2023-11-29 ENCOUNTER — Emergency Department (HOSPITAL_COMMUNITY)
Admission: EM | Admit: 2023-11-29 | Discharge: 2023-11-29 | Disposition: A | Discharge status: Home / Self Care | Attending: Emergency Medicine | Admitting: Emergency Medicine

## 2023-11-29 ENCOUNTER — Other Ambulatory Visit: Payer: Self-pay

## 2023-11-29 ENCOUNTER — Encounter (HOSPITAL_COMMUNITY): Payer: Self-pay | Admitting: Emergency Medicine

## 2023-11-29 DIAGNOSIS — W208XXA Other cause of strike by thrown, projected or falling object, initial encounter: Secondary | ICD-10-CM | POA: Diagnosis not present

## 2023-11-29 DIAGNOSIS — M542 Cervicalgia: Secondary | ICD-10-CM | POA: Insufficient documentation

## 2023-11-29 DIAGNOSIS — M25511 Pain in right shoulder: Secondary | ICD-10-CM | POA: Diagnosis not present

## 2023-11-29 DIAGNOSIS — S0990XA Unspecified injury of head, initial encounter: Secondary | ICD-10-CM | POA: Diagnosis present

## 2023-11-29 DIAGNOSIS — Y93H2 Activity, gardening and landscaping: Secondary | ICD-10-CM | POA: Insufficient documentation

## 2023-11-29 MED ORDER — OXYCODONE HCL 5 MG PO TABS: 5.0000 mg | ORAL_TABLET | Freq: Four times a day (QID) | ORAL | 0 refills | Status: DC | PRN

## 2023-11-29 MED ORDER — METHOCARBAMOL 500 MG PO TABS
1000.0000 mg | ORAL_TABLET | Freq: Four times a day (QID) | ORAL | 0 refills | Status: DC
Start: 2023-11-29 — End: 2023-11-29

## 2023-11-29 MED ORDER — OXYCODONE HCL 5 MG PO TABS: 5.0000 mg | ORAL_TABLET | Freq: Four times a day (QID) | ORAL | 0 refills | Status: AC | PRN

## 2023-11-29 MED ORDER — METHOCARBAMOL 500 MG PO TABS: 1000.0000 mg | ORAL_TABLET | Freq: Four times a day (QID) | ORAL | 0 refills | Status: AC

## 2023-11-29 MED ORDER — OXYCODONE HCL 5 MG PO TABS
5.0000 mg | ORAL_TABLET | Freq: Once | ORAL | Status: AC
  Administered 2023-11-29: 5 mg via ORAL
  Filled 2023-11-29: qty 1

## 2023-11-29 NOTE — Discharge Instructions (Signed)
 Please read and follow all provided instructions.  Your diagnoses today include:  1. Neck pain   2. Acute pain of right shoulder     Tests performed today include: An x-ray of the affected area - does NOT show any broken bones CT scan of your head, neck, and mid back did not show any signs of broken bones or traumatic injury Vital signs. See below for your results today.   Medications prescribed:  Oxycodone  - narcotic pain medication  DO NOT drive or perform any activities that require you to be awake and alert because this medicine can make you drowsy.   Robaxin  (methocarbamol ) - muscle relaxer medication  DO NOT drive or perform any activities that require you to be awake and alert because this medicine can make you drowsy.   Take any prescribed medications only as directed.  Home care instructions:  Follow any educational materials contained in this packet Follow R.I.C.E. Protocol: R - rest your injury  I  - use ice on injury without applying directly to skin C - compress injury with bandage or splint E - elevate the injury as much as possible  Follow-up instructions: Please follow-up with your primary care provider if you continue to have significant pain in 1 week. In this case you may have a more severe injury that requires further care.   Return instructions:  Please return if your fingers are numb or tingling, appear gray or blue, or you have severe pain (also elevate the arm and loosen splint or wrap if you were given one) Please return to the Emergency Department if you experience worsening symptoms.  Please return if you have any other emergent concerns.  Additional Information:  Your vital signs today were: BP 123/84 (BP Location: Right Arm)   Pulse 80   Temp 97.8 F (36.6 C) (Oral)   Resp 16   SpO2 99%  If your blood pressure (BP) was elevated above 135/85 this visit, please have this repeated by your doctor within one month. --------------
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
## 2023-11-29 NOTE — ED Triage Notes (Signed)
 Pt had a tree fall on left shoulder/neck 2 days ago. Severe pain if trying to lift items.
